# Patient Record
Sex: Male | Born: 1935 | Race: White | Hispanic: No | Marital: Single | Smoking: Former smoker
Health system: Southern US, Community
[De-identification: ages and names within clinical notes are randomized; demographics above are authoritative.]

## PROBLEM LIST (undated history)

## (undated) DIAGNOSIS — E782 Mixed hyperlipidemia: Secondary | ICD-10-CM

## (undated) DIAGNOSIS — M1990 Unspecified osteoarthritis, unspecified site: Secondary | ICD-10-CM

## (undated) DIAGNOSIS — R609 Edema, unspecified: Secondary | ICD-10-CM

## (undated) DIAGNOSIS — I10 Essential (primary) hypertension: Secondary | ICD-10-CM

## (undated) DIAGNOSIS — N400 Enlarged prostate without lower urinary tract symptoms: Secondary | ICD-10-CM

## (undated) DIAGNOSIS — E1169 Type 2 diabetes mellitus with other specified complication: Secondary | ICD-10-CM

## (undated) DIAGNOSIS — R251 Tremor, unspecified: Secondary | ICD-10-CM

## (undated) DIAGNOSIS — H9190 Unspecified hearing loss, unspecified ear: Secondary | ICD-10-CM

## (undated) DIAGNOSIS — E041 Nontoxic single thyroid nodule: Secondary | ICD-10-CM

## (undated) DIAGNOSIS — E785 Hyperlipidemia, unspecified: Secondary | ICD-10-CM

## (undated) DIAGNOSIS — I4891 Unspecified atrial fibrillation: Secondary | ICD-10-CM

## (undated) DIAGNOSIS — K219 Gastro-esophageal reflux disease without esophagitis: Secondary | ICD-10-CM

## (undated) DIAGNOSIS — D649 Anemia, unspecified: Secondary | ICD-10-CM

## (undated) DIAGNOSIS — E559 Vitamin D deficiency, unspecified: Secondary | ICD-10-CM

## (undated) DIAGNOSIS — E119 Type 2 diabetes mellitus without complications: Secondary | ICD-10-CM

## (undated) HISTORY — DX: Unspecified atrial fibrillation: I48.91

## (undated) HISTORY — DX: Benign prostatic hyperplasia without lower urinary tract symptoms: N40.0

## (undated) HISTORY — DX: Anemia, unspecified: D64.9

## (undated) HISTORY — PX: JOINT REPLACEMENT: SHX530

## (undated) HISTORY — PX: KNEE SURGERY: SHX244

## (undated) HISTORY — DX: Edema, unspecified: R60.9

## (undated) HISTORY — DX: Tremor, unspecified: R25.1

## (undated) HISTORY — DX: Essential (primary) hypertension: I10

## (undated) HISTORY — DX: Vitamin D deficiency, unspecified: E55.9

## (undated) HISTORY — DX: Mixed hyperlipidemia: E78.2

## (undated) HISTORY — DX: Type 2 diabetes mellitus with other specified complication: E11.69

## (undated) HISTORY — DX: Gastro-esophageal reflux disease without esophagitis: K21.9

## (undated) HISTORY — DX: Unspecified osteoarthritis, unspecified site: M19.90

## (undated) HISTORY — DX: Hyperlipidemia, unspecified: E78.5

## (undated) HISTORY — DX: Nontoxic single thyroid nodule: E04.1

---

## 2014-08-04 ENCOUNTER — Emergency Department (HOSPITAL_COMMUNITY): Payer: Medicare HMO

## 2014-08-04 ENCOUNTER — Encounter (HOSPITAL_COMMUNITY): Payer: Self-pay | Admitting: Emergency Medicine

## 2014-08-04 ENCOUNTER — Inpatient Hospital Stay (HOSPITAL_COMMUNITY)
Admission: EM | Admit: 2014-08-04 | Discharge: 2014-08-07 | DRG: 871 | Disposition: A | Discharge status: Skilled Nursing Facility | Payer: Medicare HMO | Attending: Internal Medicine | Admitting: Internal Medicine

## 2014-08-04 DIAGNOSIS — J209 Acute bronchitis, unspecified: Secondary | ICD-10-CM | POA: Diagnosis present

## 2014-08-04 DIAGNOSIS — A419 Sepsis, unspecified organism: Principal | ICD-10-CM | POA: Diagnosis present

## 2014-08-04 DIAGNOSIS — I129 Hypertensive chronic kidney disease with stage 1 through stage 4 chronic kidney disease, or unspecified chronic kidney disease: Secondary | ICD-10-CM | POA: Diagnosis present

## 2014-08-04 DIAGNOSIS — J9601 Acute respiratory failure with hypoxia: Secondary | ICD-10-CM | POA: Diagnosis present

## 2014-08-04 DIAGNOSIS — N184 Chronic kidney disease, stage 4 (severe): Secondary | ICD-10-CM | POA: Diagnosis present

## 2014-08-04 DIAGNOSIS — D696 Thrombocytopenia, unspecified: Secondary | ICD-10-CM | POA: Diagnosis present

## 2014-08-04 DIAGNOSIS — Z87891 Personal history of nicotine dependence: Secondary | ICD-10-CM | POA: Diagnosis not present

## 2014-08-04 DIAGNOSIS — I959 Hypotension, unspecified: Secondary | ICD-10-CM

## 2014-08-04 DIAGNOSIS — E860 Dehydration: Secondary | ICD-10-CM | POA: Diagnosis present

## 2014-08-04 DIAGNOSIS — I4891 Unspecified atrial fibrillation: Secondary | ICD-10-CM | POA: Diagnosis present

## 2014-08-04 DIAGNOSIS — I4819 Other persistent atrial fibrillation: Secondary | ICD-10-CM | POA: Insufficient documentation

## 2014-08-04 DIAGNOSIS — J189 Pneumonia, unspecified organism: Secondary | ICD-10-CM | POA: Diagnosis present

## 2014-08-04 DIAGNOSIS — D631 Anemia in chronic kidney disease: Secondary | ICD-10-CM | POA: Diagnosis present

## 2014-08-04 DIAGNOSIS — E119 Type 2 diabetes mellitus without complications: Secondary | ICD-10-CM | POA: Diagnosis present

## 2014-08-04 DIAGNOSIS — I481 Persistent atrial fibrillation: Secondary | ICD-10-CM | POA: Diagnosis present

## 2014-08-04 DIAGNOSIS — N400 Enlarged prostate without lower urinary tract symptoms: Secondary | ICD-10-CM | POA: Diagnosis present

## 2014-08-04 HISTORY — DX: Essential (primary) hypertension: I10

## 2014-08-04 HISTORY — DX: Type 2 diabetes mellitus without complications: E11.9

## 2014-08-04 LAB — BASIC METABOLIC PANEL
ANION GAP: 9 (ref 5–15)
BUN: 50 mg/dL — ABNORMAL HIGH (ref 6–23)
CALCIUM: 8 mg/dL — AB (ref 8.4–10.5)
CO2: 19 mmol/L (ref 19–32)
Chloride: 103 mmol/L (ref 96–112)
Creatinine, Ser: 2.72 mg/dL — ABNORMAL HIGH (ref 0.50–1.35)
GFR calc non Af Amer: 21 mL/min — ABNORMAL LOW (ref >90)
GFR, EST AFRICAN AMERICAN: 24 mL/min — AB (ref >90)
Glucose, Bld: 123 mg/dL — ABNORMAL HIGH (ref 70–99)
Potassium: 3.7 mmol/L (ref 3.5–5.1)
SODIUM: 131 mmol/L — AB (ref 135–145)

## 2014-08-04 LAB — CBC WITH DIFFERENTIAL/PLATELET
BASOS PCT: 0 % (ref 0–1)
Basophils Absolute: 0 10*3/uL (ref 0.0–0.1)
EOS ABS: 0 10*3/uL (ref 0.0–0.7)
EOS PCT: 0 % (ref 0–5)
HEMATOCRIT: 23.5 % — AB (ref 39.0–52.0)
Hemoglobin: 8 g/dL — ABNORMAL LOW (ref 13.0–17.0)
Lymphocytes Relative: 4 % — ABNORMAL LOW (ref 12–46)
Lymphs Abs: 0.2 10*3/uL — ABNORMAL LOW (ref 0.7–4.0)
MCH: 28.1 pg (ref 26.0–34.0)
MCHC: 34 g/dL (ref 30.0–36.0)
MCV: 82.5 fL (ref 78.0–100.0)
MONO ABS: 0.1 10*3/uL (ref 0.1–1.0)
MONOS PCT: 3 % (ref 3–12)
Neutro Abs: 4.2 10*3/uL (ref 1.7–7.7)
Neutrophils Relative %: 93 % — ABNORMAL HIGH (ref 43–77)
Platelets: 97 10*3/uL — ABNORMAL LOW (ref 150–400)
RBC: 2.85 MIL/uL — ABNORMAL LOW (ref 4.22–5.81)
RDW: 14.3 % (ref 11.5–15.5)
WBC: 4.5 10*3/uL (ref 4.0–10.5)

## 2014-08-04 LAB — I-STAT ARTERIAL BLOOD GAS, ED
Acid-base deficit: 7 mmol/L — ABNORMAL HIGH (ref 0.0–2.0)
Bicarbonate: 17.4 mEq/L — ABNORMAL LOW (ref 20.0–24.0)
O2 Saturation: 97 %
PH ART: 7.369 (ref 7.350–7.450)
Patient temperature: 98.6
TCO2: 18 mmol/L (ref 0–100)
pCO2 arterial: 30.2 mmHg — ABNORMAL LOW (ref 35.0–45.0)
pO2, Arterial: 97 mmHg (ref 80.0–100.0)

## 2014-08-04 LAB — PROTIME-INR
INR: 3.22 — ABNORMAL HIGH (ref 0.00–1.49)
PROTHROMBIN TIME: 33.1 s — AB (ref 11.6–15.2)

## 2014-08-04 LAB — TROPONIN I: Troponin I: 0.04 ng/mL — ABNORMAL HIGH (ref <0.031)

## 2014-08-04 LAB — BRAIN NATRIURETIC PEPTIDE: B NATRIURETIC PEPTIDE 5: 239 pg/mL — AB (ref 0.0–100.0)

## 2014-08-04 MED ORDER — ACETAMINOPHEN 325 MG PO TABS
650.0000 mg | ORAL_TABLET | Freq: Once | ORAL | Status: AC
  Administered 2014-08-04: 650 mg via ORAL
  Filled 2014-08-04: qty 2

## 2014-08-04 MED ORDER — VANCOMYCIN HCL 10 G IV SOLR
1500.0000 mg | Freq: Once | INTRAVENOUS | Status: DC
  Administered 2014-08-05: 1500 mg via INTRAVENOUS
  Filled 2014-08-04: qty 1500

## 2014-08-04 MED ORDER — SODIUM CHLORIDE 0.9 % IV BOLUS (SEPSIS)
1000.0000 mL | Freq: Once | INTRAVENOUS | Status: AC
  Administered 2014-08-05: 1000 mL via INTRAVENOUS

## 2014-08-04 MED ORDER — PIPERACILLIN-TAZOBACTAM 3.375 G IVPB 30 MIN
3.3750 g | Freq: Once | INTRAVENOUS | Status: AC
  Administered 2014-08-04: 3.375 g via INTRAVENOUS
  Filled 2014-08-04: qty 50

## 2014-08-04 MED ORDER — SODIUM CHLORIDE 0.9 % IV BOLUS (SEPSIS): 1000.0000 mL | Freq: Once | INTRAVENOUS | Status: DC

## 2014-08-04 MED ORDER — SODIUM CHLORIDE 0.9 % IV BOLUS (SEPSIS)
1000.0000 mL | Freq: Once | INTRAVENOUS | Status: AC
  Administered 2014-08-04: 1000 mL via INTRAVENOUS

## 2014-08-04 NOTE — ED Provider Notes (Signed)
CSN: [id number]     Arrival date & time 08/04/14  1943 History   First MD Initiated Contact with Patient 08/04/14 1957     Chief Complaint  Patient presents with  . Abnormal ECG  . Fall     (Consider location/radiation/quality/duration/timing/severity/associated sxs/prior Treatment) HPI  81 year old male presents with shortness of breath that started tonight. Patient has a history of falls, frequently over the past several weeks. He was at home with family members never working on further help for the patient with healthcare workers in the nose that he was short of breath. He had started coughing as well. Due to this EMS was called. The patient was noted to have an abnormal EKG that was concerning for a STEMI and so he was brought to [facility] instead of [place] like typical. Patient did have a fall earlier this morning. He has been having these falls frequently and this one was no different. He was walking in his legs acutely got weak and he gently lowered himself to the ground. Did not hit his head. Did not pass out. Never felt dizzy. At this time he is not having any chest pain. His short some breath has significantly improved with oxygen. He chronically has lower extremity swelling, right worse than left and this is unchanged for the past several years. He's been worked up for blood clots before and this is been negative.  No past medical history on file. No past surgical history on file. No family history on file. History  Substance Use Topics  . Smoking status: Not on file  . Smokeless tobacco: Not on file  . Alcohol Use: Not on file    Review of Systems  Constitutional: Negative for fever.  Respiratory: Positive for cough and shortness of breath.   Cardiovascular: Positive for leg swelling. Negative for chest pain.  Gastrointestinal: Negative for vomiting and abdominal pain.  Neurological: Positive for weakness. Negative for numbness and headaches.  All other systems reviewed  and are negative.     Allergies  Review of patient's allergies indicates not on file.  Home Medications   Prior to Admission medications   Not on File   SpO2 96% Physical Exam  Constitutional: He is oriented to person, place, and time. He appears well-developed and well-nourished.  HENT:  Head: Normocephalic and atraumatic.  Right Ear: External ear normal.  Left Ear: External ear normal.  Nose: Nose normal.  Eyes: Right eye exhibits no discharge. Left eye exhibits no discharge.  Neck: Neck supple.  Cardiovascular: Normal rate, regular rhythm, normal heart sounds and intact distal pulses.   Pulmonary/Chest: Effort normal. He has rales (bibasilar).  Abdominal: Soft. There is no tenderness.  Musculoskeletal: He exhibits edema (pitting edema in lower legs and feet, right greater than left).  Neurological: He is alert and oriented to person, place, and time.  Skin: Skin is warm and dry.  Nursing note and vitals reviewed.   ED Course  Procedures (including critical care time) Labs Review Labs Reviewed  BASIC METABOLIC PANEL - Abnormal; Notable for the following:    Sodium 131 (*)    Glucose, Bld 123 (*)    BUN 50 (*)    Creatinine, Ser 2.72 (*)    Calcium 8.0 (*)    GFR calc non Af Amer 21 (*)    GFR calc Af Amer 24 (*)    All other components within normal limits  BRAIN NATRIURETIC PEPTIDE - Abnormal; Notable for the following:    B Natriuretic  Peptide 239.0 (*)    All other components within normal limits  CBC WITH DIFFERENTIAL/PLATELET - Abnormal; Notable for the following:    RBC 2.85 (*)    Hemoglobin 8.0 (*)    HCT 23.5 (*)    Platelets 97 (*)    Neutrophils Relative % 93 (*)    Lymphocytes Relative 4 (*)    Lymphs Abs 0.2 (*)    All other components within normal limits  TROPONIN I - Abnormal; Notable for the following:    Troponin I 0.04 (*)    All other components within normal limits  PROTIME-INR - Abnormal; Notable for the following:    Prothrombin  Time 33.1 (*)    INR 3.22 (*)    All other components within normal limits  I-STAT ARTERIAL BLOOD GAS, ED - Abnormal; Notable for the following:    pCO2 arterial 30.2 (*)    Bicarbonate 17.4 (*)    Acid-base deficit 7.0 (*)    All other components within normal limits  CULTURE, BLOOD (ROUTINE X 2)  CULTURE, BLOOD (ROUTINE X 2)  BLOOD GAS, ARTERIAL    Imaging Review Dg Chest Port 1 View  08/04/2014   CLINICAL DATA:  Abnormal ECG, fall with weakness.  EXAM: PORTABLE CHEST - 1 VIEW  COMPARISON:  Chest radiograph August 03, 2014  FINDINGS: The cardiac silhouette is mildly enlarged. Low inspiratory examination with crowded vascular markings, mild pulmonary vascular congestion. No plural effusion or focal consolidation. No pneumothorax.  Osteopenia. Moderate degenerative change of the shoulders. Reported potential RIGHT rib fracture arm prior imaging is less apparent on portable radiography. Vascular calcifications in the neck.  IMPRESSION: Mild cardiomegaly and pulmonary vascular congestion.   Electronically Signed   By: Elon Alas   On: 08/04/2014 21:20     EKG Interpretation   Date/Time:  Tuesday August 04 2014 19:51:59 EST Ventricular Rate:  115 PR Interval:    QRS Duration: 129 QT Interval:  355 QTC Calculation: 491 R Axis:   -77 Text Interpretation:  Rhythm difficult to interpret Ventricular premature  complex RBBB and LAFB Inferior infarct, old No old tracing to compare  Confirmed by [place] ([id number]) on 08/04/2014 7:55:27 PM      MDM   Final diagnoses:  Healthcare-associated pneumonia    Patient has no respiratory distress here, but has new hypoxia and is requiring 4 L nasal cannula. Due to fever and cough this is likely a hospital-acquired pneumonia as he was recently admitted to a rehabilitation facility. While in the ER his blood pressure did start to trend down into the 90s and was given IV fluids for resuscitation. Will need admission to the  hospitalist. At this point he is breathing well with oxygen support and is in no need of acute airway management.    Ephraim Hamburger, MD 08/04/14 [phone]

## 2014-08-04 NOTE — H&P (Signed)
[facility] History and Physical  Glen Lee OZH:[id number] DOB: [birth year] DOA: 08/04/2014  Referring physician: Sherwood Gambler, MD PCP: PROVIDER NOT IN SYSTEM   Chief Complaint: Hypotension  HPI: Glen Lee is a 81 y.o. male presents with hypotension and pneumonia. His sister is with him in the room. Patient was recently discharged from a rehab facility due to weakness deconditioning and frequent falls. He was living at home for about a week. His sister states he started falling again over the weekend. She states he was on the ground for the whole day. She finally brought him to her house on Sunday as she was very concerned for his safety. He has been dwindling and has been more short of breath. He has been still with it mentally. Patient has had no fevers noted. No cough or congestion noted. Patient has been a smoker in the past. He also is a diabetic and has hypertension.   Review of Systems:  Constitutional:  No weight loss, night sweats, Fevers, chills, ++fatigue.  HEENT:  No headaches, itching, ear ache, nasal congestion, post nasal drip,  Cardio-vascular:  No chest pain, Orthopnea, PND, +swelling in lower extremities, dizziness, palpitations  GI:  No heartburn, indigestion, abdominal pain, nausea, vomiting, diarrhea  Resp:  ++shortness of breath with exertion. no productive cough, No coughing up of blood.No wheezing.  Skin:  Apparently had cellulitis about 3 weeks ago GU:  no dysuria, change in color of urine, no urgency or frequency Musculoskeletal:  No joint pain or swelling. No decreased range of motion. No back pain.  Psych:  No change in mood or affect. No depression or anxiety. No memory loss.   Past Medical History  Diagnosis Date  . Diabetes mellitus without complication   . Hypertension    History reviewed. No pertinent past surgical history. Social History:  reports that he quit smoking about 22 years ago. His smoking use included Cigarettes. He  does not have any smokeless tobacco history on file. He reports that he does not drink alcohol. His drug history is not on file.  No Known Allergies  No family history on file.   Prior to Admission medications   Medication Sig Start Date End Date Taking? Authorizing Provider  amLODipine (NORVASC) 5 MG tablet Take 5 mg by mouth daily.   Yes Historical Provider, MD  Cholecalciferol (VITAMIN D3) 3000 UNITS TABS Take 1 tablet by mouth daily.   Yes Historical Provider, MD  dutasteride (AVODART) 0.5 MG capsule Take 0.5 mg by mouth daily.   Yes Historical Provider, MD  exenatide (BYETTA) 5 MCG/0.02ML SOPN injection Inject 5 mcg into the skin daily as needed (bs > 130).   Yes Historical Provider, MD  ferrous sulfate 325 (65 FE) MG tablet Take 325 mg by mouth daily with breakfast.   Yes Historical Provider, MD  finasteride (PROSCAR) 5 MG tablet Take 5 mg by mouth daily.   Yes Historical Provider, MD  lisinopril (PRINIVIL,ZESTRIL) 20 MG tablet Take 20 mg by mouth daily.   Yes Historical Provider, MD  Mesalamine 800 MG TBEC Take 800 mg by mouth 3 (three) times daily.   Yes Historical Provider, MD  Omega-3 Fatty Acids (OMEGA-3 FISH OIL) 300 MG CAPS Take 300 mg by mouth daily.   Yes Historical Provider, MD  oxymetazoline (AFRIN) 0.05 % nasal spray Place 1 spray into both nostrils 2 (two) times daily as needed for congestion.   Yes Historical Provider, MD  pantoprazole (PROTONIX) 40 MG tablet Take 40 mg by mouth daily.  Yes Historical Provider, MD  POTASSIUM GLUCONATE PO Take 1 tablet by mouth daily.   Yes Historical Provider, MD  rivaroxaban (XARELTO) 20 MG TABS tablet Take 20 mg by mouth daily with supper.   Yes Historical Provider, MD  simvastatin (ZOCOR) 40 MG tablet Take 40 mg by mouth daily.   Yes Historical Provider, MD  sitaGLIPtin (JANUVIA) 25 MG tablet Take 25 mg by mouth daily.   Yes Historical Provider, MD  tamsulosin (FLOMAX) 0.4 MG CAPS capsule Take 0.4 mg by mouth daily.   Yes Historical  Provider, MD  Vitamin D, Ergocalciferol, (DRISDOL) 50000 UNITS CAPS capsule Take 50,000 Units by mouth every 7 (seven) days.   Yes Historical Provider, MD   Physical Exam: Filed Vitals:   08/04/14 2115 08/04/14 2237 08/04/14 2245 08/04/14 2300  BP: 95/53 89/51 92/44    Pulse: 91 83 81   Temp:  98.9 F (37.2 C)    TempSrc:  Rectal    Resp: 22  16   Height:    6' (1.829 m)  Weight:    99.338 kg (219 lb)  SpO2: 93% 98% 99%     Wt Readings from Last 3 Encounters:  08/04/14 99.338 kg (219 lb)    General:  Appears calm and comfortable Eyes: PERRL, normal lids, irises & conjunctiva ENT: grossly normal hearing, lips & tongue Neck: no LAD, masses or thyromegaly Cardiovascular: RRR, no m/r/g. ++ LE edema. Respiratory: few ronchi noted increased WOB noted. Abdomen: soft, ntnd Skin: ?petechiae on LE seen on limited exam Musculoskeletal: grossly normal tone BUE/BLE Psychiatric: speech fluent and appropriate Neurologic: grossly non-focal.          Labs on Admission:  Basic Metabolic Panel:  Recent Labs Lab 08/04/14 2058  NA 131*  K 3.7  CL 103  CO2 19  GLUCOSE 123*  BUN 50*  CREATININE 2.72*  CALCIUM 8.0*   Liver Function Tests: No results for input(s): AST, ALT, ALKPHOS, BILITOT, PROT, ALBUMIN in the last 168 hours. No results for input(s): LIPASE, AMYLASE in the last 168 hours. No results for input(s): AMMONIA in the last 168 hours. CBC:  Recent Labs Lab 08/04/14 2058  WBC 4.5  NEUTROABS 4.2  HGB 8.0*  HCT 23.5*  MCV 82.5  PLT 97*   Cardiac Enzymes:  Recent Labs Lab 08/04/14 2058  TROPONINI 0.04*    BNP (last 3 results)  Recent Labs  08/04/14 2058  BNP 239.0*    ProBNP (last 3 results) No results for input(s): PROBNP in the last 8760 hours.  CBG: No results for input(s): GLUCAP in the last 168 hours.  Radiological Exams on Admission: Dg Chest Port 1 View  08/04/2014   CLINICAL DATA:  Abnormal ECG, fall with weakness.  EXAM: PORTABLE CHEST -  1 VIEW  COMPARISON:  Chest radiograph August 03, 2014  FINDINGS: The cardiac silhouette is mildly enlarged. Low inspiratory examination with crowded vascular markings, mild pulmonary vascular congestion. No plural effusion or focal consolidation. No pneumothorax.  Osteopenia. Moderate degenerative change of the shoulders. Reported potential RIGHT rib fracture arm prior imaging is less apparent on portable radiography. Vascular calcifications in the neck.  IMPRESSION: Mild cardiomegaly and pulmonary vascular congestion.   Electronically Signed   By: Elon Alas   On: 08/04/2014 21:20      Assessment/Plan Active Problems:   Healthcare-associated pneumonia   Diabetes mellitus   Hypotension   1. HCAP -Patient has been in a skilled facility has an abnormal appearing CXR presently will cover broadly for  infection of HCAP organisms started on cefepime and Vancomycin -will also get an ABG now -will follow up CXR  2. Diabetes Mellitus -will get A1C -will continue with home medications -will also check FSBS and provide sliding scale coverage  3. Hypotension -related to infection/sepsis -started on antibiotics -will also hydrate seems to be responding to fluid challenge  4. CKD IV -do not have baseline creatinine -will hydrate -will monitor labs    Code Status: Full Code (must indicate code status--if unknown or must be presumed, indicate so) DVT Prophylaxis:heparin Family Communication: Sister (indicate person spoken with, if applicable, with phone number if by telephone) Disposition Plan: Home (indicate anticipated LOS)  Time spent: 43mn  Aizah Gehlhausen A [facility] Pager [phone]

## 2014-08-04 NOTE — ED Notes (Signed)
MD Regenia Skeeter at bedside

## 2014-08-04 NOTE — ED Notes (Signed)
Per EMS: Patient called 911 in am due to a fall. Call was cancelled prior to EMS arrival by [employer]. Second call came in this visit due to a second fall today. Denies chest pain, shortness of breath, or N&V. However, EKG revealed abnormalities revealing a possible STEMI and A fib. No history of either. Hst of HTN, High Cholesterol and DM controlled by po medications only. CBG 272 by fire department and repeat for EMS was 127. 87% on RA and 96% on 4L [place]. R side tremors due to previous nerve damage. Edema in BLE with R side more edematous than L. NKA. 324 ASA provided and no nitro due to lack of pain.

## 2014-08-05 ENCOUNTER — Encounter (HOSPITAL_COMMUNITY): Payer: Self-pay | Admitting: *Deleted

## 2014-08-05 ENCOUNTER — Inpatient Hospital Stay (HOSPITAL_COMMUNITY): Payer: Medicare HMO

## 2014-08-05 DIAGNOSIS — I4819 Other persistent atrial fibrillation: Secondary | ICD-10-CM | POA: Insufficient documentation

## 2014-08-05 DIAGNOSIS — I481 Persistent atrial fibrillation: Secondary | ICD-10-CM

## 2014-08-05 LAB — URINALYSIS, ROUTINE W REFLEX MICROSCOPIC
Bilirubin Urine: NEGATIVE
Glucose, UA: NEGATIVE mg/dL
Hgb urine dipstick: NEGATIVE
Ketones, ur: NEGATIVE mg/dL
LEUKOCYTES UA: NEGATIVE
NITRITE: NEGATIVE
PH: 5 (ref 5.0–8.0)
PROTEIN: 30 mg/dL — AB
Specific Gravity, Urine: 1.023 (ref 1.005–1.030)
UROBILINOGEN UA: 0.2 mg/dL (ref 0.0–1.0)

## 2014-08-05 LAB — CBC
HEMATOCRIT: 23.2 % — AB (ref 39.0–52.0)
HEMOGLOBIN: 7.7 g/dL — AB (ref 13.0–17.0)
MCH: 28.3 pg (ref 26.0–34.0)
MCHC: 33.2 g/dL (ref 30.0–36.0)
MCV: 85.3 fL (ref 78.0–100.0)
Platelets: 77 10*3/uL — ABNORMAL LOW (ref 150–400)
RBC: 2.72 MIL/uL — ABNORMAL LOW (ref 4.22–5.81)
RDW: 14.6 % (ref 11.5–15.5)
WBC: 3.4 10*3/uL — ABNORMAL LOW (ref 4.0–10.5)

## 2014-08-05 LAB — COMPREHENSIVE METABOLIC PANEL
ALBUMIN: 2.2 g/dL — AB (ref 3.5–5.2)
ALT: 26 U/L (ref 0–53)
AST: 37 U/L (ref 0–37)
Alkaline Phosphatase: 62 U/L (ref 39–117)
Anion gap: 9 (ref 5–15)
BUN: 51 mg/dL — AB (ref 6–23)
CO2: 18 mmol/L — AB (ref 19–32)
Calcium: 7.6 mg/dL — ABNORMAL LOW (ref 8.4–10.5)
Chloride: 106 mmol/L (ref 96–112)
Creatinine, Ser: 2.81 mg/dL — ABNORMAL HIGH (ref 0.50–1.35)
GFR calc Af Amer: 23 mL/min — ABNORMAL LOW (ref >90)
GFR calc non Af Amer: 20 mL/min — ABNORMAL LOW (ref >90)
GLUCOSE: 132 mg/dL — AB (ref 70–99)
Potassium: 3.7 mmol/L (ref 3.5–5.1)
SODIUM: 133 mmol/L — AB (ref 135–145)
TOTAL PROTEIN: 4.6 g/dL — AB (ref 6.0–8.3)
Total Bilirubin: 0.9 mg/dL (ref 0.3–1.2)

## 2014-08-05 LAB — MRSA PCR SCREENING: MRSA BY PCR: NEGATIVE

## 2014-08-05 LAB — INFLUENZA PANEL BY PCR (TYPE A & B)
H1N1FLUPCR: NOT DETECTED
Influenza A By PCR: NEGATIVE
Influenza B By PCR: NEGATIVE

## 2014-08-05 LAB — TROPONIN I
TROPONIN I: 0.03 ng/mL (ref <0.031)
Troponin I: 0.03 ng/mL (ref <0.031)
Troponin I: 0.03 ng/mL (ref <0.031)

## 2014-08-05 LAB — LEGIONELLA ANTIGEN, URINE

## 2014-08-05 LAB — GLUCOSE, CAPILLARY
GLUCOSE-CAPILLARY: 169 mg/dL — AB (ref 70–99)
Glucose-Capillary: 166 mg/dL — ABNORMAL HIGH (ref 70–99)

## 2014-08-05 LAB — URINE MICROSCOPIC-ADD ON

## 2014-08-05 LAB — STREP PNEUMONIAE URINARY ANTIGEN: Strep Pneumo Urinary Antigen: NEGATIVE

## 2014-08-05 LAB — TSH: TSH: 0.713 u[IU]/mL (ref 0.350–4.500)

## 2014-08-05 MED ORDER — VANCOMYCIN HCL IN DEXTROSE 750-5 MG/150ML-% IV SOLN
750.0000 mg | INTRAVENOUS | Status: DC
  Administered 2014-08-05: 750 mg via INTRAVENOUS
  Filled 2014-08-05: qty 150

## 2014-08-05 MED ORDER — ATORVASTATIN CALCIUM 20 MG PO TABS
20.0000 mg | ORAL_TABLET | Freq: Every day | ORAL | Status: DC
  Administered 2014-08-05 – 2014-08-06 (×2): 20 mg via ORAL
  Filled 2014-08-05 (×3): qty 1

## 2014-08-05 MED ORDER — MESALAMINE 800 MG PO TBEC: 800.0000 mg | DELAYED_RELEASE_TABLET | Freq: Three times a day (TID) | ORAL | Status: DC

## 2014-08-05 MED ORDER — VITAMIN B-1 100 MG PO TABS
100.0000 mg | ORAL_TABLET | Freq: Every day | ORAL | Status: DC
  Administered 2014-08-05 – 2014-08-07 (×3): 100 mg via ORAL
  Filled 2014-08-05 (×3): qty 1

## 2014-08-05 MED ORDER — FOLIC ACID 1 MG PO TABS
1.0000 mg | ORAL_TABLET | Freq: Every day | ORAL | Status: DC
  Administered 2014-08-05 – 2014-08-07 (×3): 1 mg via ORAL
  Filled 2014-08-05 (×3): qty 1

## 2014-08-05 MED ORDER — SODIUM CHLORIDE 0.9 % IV SOLN: INTRAVENOUS | Status: DC

## 2014-08-05 MED ORDER — ADULT MULTIVITAMIN W/MINERALS CH
1.0000 | ORAL_TABLET | Freq: Every day | ORAL | Status: DC
  Administered 2014-08-05 – 2014-08-07 (×3): 1 via ORAL
  Filled 2014-08-05 (×3): qty 1

## 2014-08-05 MED ORDER — ACETAMINOPHEN 325 MG PO TABS
650.0000 mg | ORAL_TABLET | Freq: Four times a day (QID) | ORAL | Status: DC | PRN
  Administered 2014-08-06 (×3): 650 mg via ORAL
  Filled 2014-08-05 (×3): qty 2

## 2014-08-05 MED ORDER — PNEUMOCOCCAL VAC POLYVALENT 25 MCG/0.5ML IJ INJ
0.5000 mL | INJECTION | INTRAMUSCULAR | Status: DC
  Filled 2014-08-05: qty 0.5

## 2014-08-05 MED ORDER — ACETAMINOPHEN 650 MG RE SUPP: 650.0000 mg | Freq: Four times a day (QID) | RECTAL | Status: DC | PRN

## 2014-08-05 MED ORDER — MESALAMINE 400 MG PO CPDR
800.0000 mg | DELAYED_RELEASE_CAPSULE | Freq: Three times a day (TID) | ORAL | Status: DC
  Administered 2014-08-05 (×2): 800 mg via ORAL
  Administered 2014-08-06: 400 mg via ORAL
  Administered 2014-08-06 – 2014-08-07 (×3): 800 mg via ORAL
  Filled 2014-08-05 (×9): qty 2

## 2014-08-05 MED ORDER — VANCOMYCIN HCL IN DEXTROSE 750-5 MG/150ML-% IV SOLN
750.0000 mg | INTRAVENOUS | Status: AC
  Administered 2014-08-05: 750 mg via INTRAVENOUS
  Filled 2014-08-05: qty 150

## 2014-08-05 MED ORDER — DILTIAZEM HCL 60 MG PO TABS
60.0000 mg | ORAL_TABLET | Freq: Three times a day (TID) | ORAL | Status: DC
  Administered 2014-08-05 (×2): 60 mg via ORAL
  Filled 2014-08-05 (×6): qty 1

## 2014-08-05 MED ORDER — PANTOPRAZOLE SODIUM 40 MG PO TBEC
40.0000 mg | DELAYED_RELEASE_TABLET | Freq: Every day | ORAL | Status: DC
  Administered 2014-08-05 – 2014-08-07 (×3): 40 mg via ORAL
  Filled 2014-08-05 (×2): qty 1

## 2014-08-05 MED ORDER — FERROUS SULFATE 325 (65 FE) MG PO TABS
325.0000 mg | ORAL_TABLET | Freq: Every day | ORAL | Status: DC
Start: 2014-08-05 — End: 2014-08-07
  Administered 2014-08-05 – 2014-08-07 (×3): 325 mg via ORAL
  Filled 2014-08-05 (×4): qty 1

## 2014-08-05 MED ORDER — ONDANSETRON HCL 4 MG/2ML IJ SOLN
4.0000 mg | Freq: Four times a day (QID) | INTRAMUSCULAR | Status: DC | PRN
  Administered 2014-08-05: 4 mg via INTRAVENOUS
  Filled 2014-08-05: qty 2

## 2014-08-05 MED ORDER — EXENATIDE 5 MCG/0.02ML ~~LOC~~ SOPN: 5.0000 ug | PEN_INJECTOR | Freq: Every day | SUBCUTANEOUS | Status: DC | PRN

## 2014-08-05 MED ORDER — FINASTERIDE 5 MG PO TABS
5.0000 mg | ORAL_TABLET | Freq: Every day | ORAL | Status: DC
  Administered 2014-08-05 – 2014-08-07 (×3): 5 mg via ORAL
  Filled 2014-08-05 (×3): qty 1

## 2014-08-05 MED ORDER — VANCOMYCIN HCL 10 G IV SOLR: 1500.0000 mg | INTRAVENOUS | Status: DC

## 2014-08-05 MED ORDER — INSULIN ASPART 100 UNIT/ML ~~LOC~~ SOLN: 0.0000 [IU] | Freq: Every day | SUBCUTANEOUS | Status: DC

## 2014-08-05 MED ORDER — INSULIN ASPART 100 UNIT/ML ~~LOC~~ SOLN
0.0000 [IU] | Freq: Three times a day (TID) | SUBCUTANEOUS | Status: DC
  Administered 2014-08-05 – 2014-08-06 (×2): 3 [IU] via SUBCUTANEOUS
  Administered 2014-08-06: 2 [IU] via SUBCUTANEOUS
  Administered 2014-08-06 – 2014-08-07 (×2): 3 [IU] via SUBCUTANEOUS

## 2014-08-05 MED ORDER — LINAGLIPTIN 5 MG PO TABS
5.0000 mg | ORAL_TABLET | Freq: Every day | ORAL | Status: DC
  Administered 2014-08-05 – 2014-08-07 (×3): 5 mg via ORAL
  Filled 2014-08-05 (×3): qty 1

## 2014-08-05 MED ORDER — SODIUM CHLORIDE 0.9 % IJ SOLN
3.0000 mL | Freq: Two times a day (BID) | INTRAMUSCULAR | Status: DC
  Administered 2014-08-05 – 2014-08-07 (×3): 3 mL via INTRAVENOUS

## 2014-08-05 MED ORDER — PROMETHAZINE HCL 25 MG/ML IJ SOLN
12.5000 mg | Freq: Four times a day (QID) | INTRAMUSCULAR | Status: DC | PRN
  Administered 2014-08-05: 12.5 mg via INTRAVENOUS
  Filled 2014-08-05: qty 1

## 2014-08-05 MED ORDER — ONDANSETRON HCL 4 MG PO TABS: 4.0000 mg | ORAL_TABLET | Freq: Four times a day (QID) | ORAL | Status: DC | PRN

## 2014-08-05 MED ORDER — TAMSULOSIN HCL 0.4 MG PO CAPS
0.4000 mg | ORAL_CAPSULE | Freq: Every day | ORAL | Status: DC
  Administered 2014-08-05 – 2014-08-07 (×3): 0.4 mg via ORAL
  Filled 2014-08-05 (×3): qty 1

## 2014-08-05 MED ORDER — AZITHROMYCIN 500 MG PO TABS
500.0000 mg | ORAL_TABLET | Freq: Every day | ORAL | Status: AC
  Administered 2014-08-05: 500 mg via ORAL
  Filled 2014-08-05: qty 1

## 2014-08-05 MED ORDER — AZITHROMYCIN 250 MG PO TABS
250.0000 mg | ORAL_TABLET | Freq: Every day | ORAL | Status: DC
  Filled 2014-08-05: qty 1

## 2014-08-05 MED ORDER — DUTASTERIDE 0.5 MG PO CAPS
0.5000 mg | ORAL_CAPSULE | Freq: Every day | ORAL | Status: DC
  Administered 2014-08-05: 0.5 mg via ORAL
  Filled 2014-08-05: qty 1

## 2014-08-05 MED ORDER — SIMVASTATIN 40 MG PO TABS
40.0000 mg | ORAL_TABLET | Freq: Every day | ORAL | Status: DC
  Filled 2014-08-05: qty 1

## 2014-08-05 MED ORDER — PIPERACILLIN-TAZOBACTAM 3.375 G IVPB
3.3750 g | Freq: Three times a day (TID) | INTRAVENOUS | Status: DC
  Administered 2014-08-05 – 2014-08-06 (×4): 3.375 g via INTRAVENOUS
  Filled 2014-08-05 (×6): qty 50

## 2014-08-05 NOTE — Progress Notes (Signed)
Initial sputum sample collected this a.m. insuff amnt & had piece of food in it; have asked pt multiple times about bringing up sputum, says has none.  (Rare cough, dry.)  Lungs clear.

## 2014-08-05 NOTE — Progress Notes (Signed)
Notified o/c(Schorr) about decrease in BP=80s/40s, pt is asymptomatic with no complaints. Instructed to notify if MAP falls below 55, will continue monitoring

## 2014-08-05 NOTE — Progress Notes (Signed)
[facility] Progress Note   Glen Lee TRR:[id number] DOB: 10/06/1933 DOA: 08/04/2014 PCP: PROVIDER NOT IN SYSTEM  Brief narrative: Glen Lee is a 81 y.o. male with past medical history of hypertension and diabetes who presented to the hospital for multiple falls. The patient states that he went to rehabilitation about a month ago and eventually came back home. Once home he began getting weak again and has been falling because his legs are now giving out. In the ER he was noted to be hypoxic on room air and required 4 L of nasal cannula to keep his pulse ox above 90. Apparently he also complained of cough and shortness of breath to the ER doctor. Rectal temp was noted to be 102.3. He was started on treatment for HCAP he was recently in a rehabilitation facility.   Subjective: Has no complaints. When asked today, he states that he has not had much of a cough. He has not noticed if he had fevers either. Very concerned about his multiple falls especially as he lives alone.  Assessment/Plan: Principal Problem:   Healthcare-associated pneumonia? Fever - Chest x-ray is clear- Will repeat a chest x-ray which will BE PA/lateral - We'll obtain a UA as well - Continue treatment with vancomycin and Zosyn-add Zithromax  Active Problems: Atrial fibrillation -He does not appear to have a diagnosis of this in the chart but appears to have been on Xarelto at home which should not be resumed due to his poor creatinine clearance - Will start short acting Cardizem as he is not on a rate controlling agent -due to frequent falls, he is not a candidate for long-term anticoagulation but while he is here being monitored we can place him on Lovenox - Holding Norvasc    Diabetes mellitus - Takes Byetta and Januvia at home-has been placed on Trajenta -will start sliding scale insulin and obtain A1c    CKD (chronic kidney disease) stage 4, GFR 15-29 ml/min - No baseline creatinine to compare with-  holding lisinopril  BPH -Continue Avodart and Flomax  Code Status: Full code Family Communication:  Disposition Plan: Likely will need to go to skilled nursing facility DVT prophylaxis: Lovenox  Consultants:   Procedures:   Antibiotics: Anti-infectives    Start     Dose/Rate Route Frequency Ordered Stop   08/06/14 1000  azithromycin (ZITHROMAX) tablet 250 mg     250 mg Oral Daily 08/05/14 0900 08/10/14 0959   08/05/14 1000  azithromycin (ZITHROMAX) tablet 500 mg     500 mg Oral Daily 08/05/14 0900 08/05/14 0940   08/05/14 0600  vancomycin (VANCOCIN) IVPB 750 mg/150 ml premix     750 mg 150 mL/hr over 60 Minutes Intravenous Every 24 hours 08/05/14 0102     08/05/14 0600  piperacillin-tazobactam (ZOSYN) IVPB 3.375 g     3.375 g 12.5 mL/hr over 240 Minutes Intravenous 3 times per day 08/05/14 0102     08/04/14 2330  vancomycin (VANCOCIN) 1,500 mg in sodium chloride 0.9 % 500 mL IVPB  Status:  Discontinued     1,500 mg 250 mL/hr over 120 Minutes Intravenous  Once 08/04/14 2311 08/05/14 0102   08/04/14 2315  piperacillin-tazobactam (ZOSYN) IVPB 3.375 g     3.375 g 100 mL/hr over 30 Minutes Intravenous  Once 08/04/14 2311 08/04/14 2354         Objective: Filed Weights   08/04/14 2300 08/05/14 0102  Weight: 99.338 kg (219 lb) 102.9 kg (226 lb 13.7 oz)    Intake/Output  Summary (Last 24 hours) at 08/05/14 1127 Last data filed at 08/05/14 0800  Gross per 24 hour  Intake   1740 ml  Output    350 ml  Net   1390 ml     Vitals Filed Vitals:   08/05/14 0700 08/05/14 0800 08/05/14 1000 08/05/14 1100  BP:  126/68  111/67  Pulse:  117 105 102  Temp: 98.8 F (37.1 C)  98.2 F (36.8 C)   TempSrc: Oral  Oral   Resp:  24 24 23   Height:      Weight:      SpO2:  96% 95% 94%    Exam: General: Awake alert oriented 3, No acute respiratory distress Lungs: Clear to auscultation bilaterally without wheezes or crackles- and is unable to sit up and due to obesity breath  sounds are decreased Cardiovascular: Regular rate and rhythm without murmur gallop or rub normal S1 and S2 Abdomen: Nontender, nondistended, soft, bowel sounds positive, no rebound, no ascites, no appreciable mass Extremities: No significant cyanosis, clubbing, or edema bilateral lower extremities  Data Reviewed: Basic Metabolic Panel:  Recent Labs Lab 08/04/14 2058 08/05/14 0131  NA 131* 133*  K 3.7 3.7  CL 103 106  CO2 19 18*  GLUCOSE 123* 132*  BUN 50* 51*  CREATININE 2.72* 2.81*  CALCIUM 8.0* 7.6*   Liver Function Tests:  Recent Labs Lab 08/05/14 0131  AST 37  ALT 26  ALKPHOS 62  BILITOT 0.9  PROT 4.6*  ALBUMIN 2.2*   No results for input(s): LIPASE, AMYLASE in the last 168 hours. No results for input(s): AMMONIA in the last 168 hours. CBC:  Recent Labs Lab 08/04/14 2058 08/05/14 0131  WBC 4.5 3.4*  NEUTROABS 4.2  --   HGB 8.0* 7.7*  HCT 23.5* 23.2*  MCV 82.5 85.3  PLT 97* 77*   Cardiac Enzymes:  Recent Labs Lab 08/04/14 2058 08/05/14 0131 08/05/14 0801  TROPONINI 0.04* 0.03 0.03   BNP (last 3 results)  Recent Labs  08/04/14 2058  BNP 239.0*    ProBNP (last 3 results) No results for input(s): PROBNP in the last 8760 hours.  CBG: No results for input(s): GLUCAP in the last 168 hours.  Recent Results (from the past 240 hour(s))  MRSA PCR Screening     Status: None   Collection Time: 08/05/14  3:07 AM  Result Value Ref Range Status   MRSA by PCR NEGATIVE NEGATIVE Final    Comment:        The GeneXpert MRSA Assay (FDA approved for NASAL specimens only), is one component of a comprehensive MRSA colonization surveillance program. It is not intended to diagnose MRSA infection nor to guide or monitor treatment for MRSA infections.      Studies:  Recent x-ray studies have been reviewed in detail by the Attending Physician  Scheduled Meds:  Scheduled Meds: . [START ON 08/06/2014] azithromycin  250 mg Oral Daily  . dutasteride   0.5 mg Oral Daily  . ferrous sulfate  325 mg Oral Q breakfast  . finasteride  5 mg Oral Daily  . folic acid  1 mg Oral Daily  . linagliptin  5 mg Oral Daily  . multivitamin with minerals  1 tablet Oral Daily  . pantoprazole  40 mg Oral Daily  . piperacillin-tazobactam (ZOSYN)  IV  3.375 g Intravenous 3 times per day  . [START ON 08/06/2014] pneumococcal 23 valent vaccine  0.5 mL Intramuscular Tomorrow-1000  . simvastatin  40 mg Oral q1800  .  sodium chloride  3 mL Intravenous Q12H  . thiamine  100 mg Oral Daily  . vancomycin  750 mg Intravenous Q24H   Continuous Infusions:   Time spent on care of this patient: 35 minutes   [place], MD 08/05/2014, 11:27 AM  LOS: 1 day   [facility] Office  [phone] Pager - Text Page per [email or web address]  If 7PM-7AM, please contact night-coverage [email or web address]

## 2014-08-05 NOTE — Clinical Social Work Note (Signed)
CSW Consult Acknowledged:   CSW received a consult for SNF placement. CSW awaiting PT/OT evaluation to determine the appropriate level of care.      [place], MSW, [place]

## 2014-08-05 NOTE — Progress Notes (Signed)
ANTIBIOTIC CONSULT NOTE - INITIAL  Pharmacy Consult for Vancomycin and Zosyn  Indication: rule out pneumonia  Allergies  Allergen Reactions  . Keflet [Cephalexin] Rash    Patient Measurements: Height: 6' (182.9 cm) Weight: 219 lb (99.338 kg) IBW/kg (Calculated) : 77.6  Vital Signs: Temp: 98.9 F (37.2 C) (02/23 2237) Temp Source: Rectal (02/23 2237) BP: 94/50 mmHg (02/24 0030) Pulse Rate: 80 (02/24 0030) Intake/Output from previous day: 02/23 0701 - 02/24 0700 In: 1000 [IV Piggyback:1000] Out: -  Intake/Output from this shift: Total I/O In: 1000 [IV Piggyback:1000] Out: -   Labs:  Recent Labs  08/04/14 2058  WBC 4.5  HGB 8.0*  PLT 97*  CREATININE 2.72*   Estimated Creatinine Clearance: 27.3 mL/min (by C-G formula based on Cr of 2.72). No results for input(s): VANCOTROUGH, VANCOPEAK, VANCORANDOM, GENTTROUGH, GENTPEAK, GENTRANDOM, TOBRATROUGH, TOBRAPEAK, TOBRARND, AMIKACINPEAK, AMIKACINTROU, AMIKACIN in the last 72 hours.   Microbiology: No results found for this or any previous visit (from the past 720 hour(s)).  Medical History: Past Medical History  Diagnosis Date  . Diabetes mellitus without complication   . Hypertension     Medications:  Norvasc  Vit D  Avodart  Byetta  Iron  Proscar  Zestril  Mesalamine  Fish Oil  Protonix  Xarelto  Zocor  Januvia  Flomax     Assessment: 81 yo male with hypotension/SOB for empiric antibiotics  Goal of Therapy:  Vancomycin trough level 15-20 mcg/ml  Plan:  Vancomycin 1500 mg IV now, then 750 mg IV q24h Zosyn 3.375 g IV q8h   Iwalani Templeton, [place] 08/05/2014,12:48 AM

## 2014-08-05 NOTE — Progress Notes (Signed)
Pt transferred from the ED around 0100, admitted to Rm/3s15. Pt comes from home alone but currently staying with sister due to recent falls. He is alert and oriented but forgetful at times. Generalized weakness and tremors. No skin breakdown noted. Placed on telemetry, currently Afib. Oriented to room, instructed to call for assistance before getting out of bed. Resting comfortably at this time, will continue to monitor

## 2014-08-05 NOTE — ED Notes (Signed)
Report attempted 

## 2014-08-05 NOTE — Progress Notes (Signed)
Utilization review completed.  

## 2014-08-06 DIAGNOSIS — I951 Orthostatic hypotension: Secondary | ICD-10-CM

## 2014-08-06 DIAGNOSIS — J209 Acute bronchitis, unspecified: Secondary | ICD-10-CM

## 2014-08-06 LAB — CBC
HEMATOCRIT: 22.7 % — AB (ref 39.0–52.0)
Hemoglobin: 7.7 g/dL — ABNORMAL LOW (ref 13.0–17.0)
MCH: 28.2 pg (ref 26.0–34.0)
MCHC: 33.9 g/dL (ref 30.0–36.0)
MCV: 83.2 fL (ref 78.0–100.0)
Platelets: 99 10*3/uL — ABNORMAL LOW (ref 150–400)
RBC: 2.73 MIL/uL — ABNORMAL LOW (ref 4.22–5.81)
RDW: 14.5 % (ref 11.5–15.5)
WBC: 5.4 10*3/uL (ref 4.0–10.5)

## 2014-08-06 LAB — GLUCOSE, CAPILLARY
GLUCOSE-CAPILLARY: 134 mg/dL — AB (ref 70–99)
Glucose-Capillary: 187 mg/dL — ABNORMAL HIGH (ref 70–99)
Glucose-Capillary: 176 mg/dL — ABNORMAL HIGH (ref 70–99)
Glucose-Capillary: 134 mg/dL — ABNORMAL HIGH (ref 70–99)

## 2014-08-06 LAB — BASIC METABOLIC PANEL
Anion gap: 12 (ref 5–15)
BUN: 58 mg/dL — AB (ref 6–23)
CHLORIDE: 105 mmol/L (ref 96–112)
CO2: 16 mmol/L — AB (ref 19–32)
Calcium: 7.8 mg/dL — ABNORMAL LOW (ref 8.4–10.5)
Creatinine, Ser: 2.97 mg/dL — ABNORMAL HIGH (ref 0.50–1.35)
GFR calc non Af Amer: 19 mL/min — ABNORMAL LOW (ref >90)
GFR, EST AFRICAN AMERICAN: 22 mL/min — AB (ref >90)
Glucose, Bld: 129 mg/dL — ABNORMAL HIGH (ref 70–99)
Potassium: 3.7 mmol/L (ref 3.5–5.1)
Sodium: 133 mmol/L — ABNORMAL LOW (ref 135–145)

## 2014-08-06 LAB — RETICULOCYTES
RBC.: 2.86 MIL/uL — AB (ref 4.22–5.81)
Retic Count, Absolute: 11.4 10*3/uL — ABNORMAL LOW (ref 19.0–186.0)
Retic Ct Pct: 0.4 % (ref 0.4–3.1)

## 2014-08-06 LAB — HEMOGLOBIN A1C
Hgb A1c MFr Bld: 6.6 % — ABNORMAL HIGH (ref 4.8–5.6)
Mean Plasma Glucose: 143 mg/dL

## 2014-08-06 MED ORDER — SODIUM CHLORIDE 0.9 % IV BOLUS (SEPSIS): 500.0000 mL | Freq: Once | INTRAVENOUS | Status: DC

## 2014-08-06 MED ORDER — SODIUM CHLORIDE 0.9 % IV BOLUS (SEPSIS)
1000.0000 mL | Freq: Once | INTRAVENOUS | Status: AC
  Administered 2014-08-06: 1000 mL via INTRAVENOUS

## 2014-08-06 MED ORDER — DILTIAZEM HCL 30 MG PO TABS
30.0000 mg | ORAL_TABLET | Freq: Three times a day (TID) | ORAL | Status: DC
  Administered 2014-08-06 – 2014-08-07 (×3): 30 mg via ORAL
  Filled 2014-08-06 (×6): qty 1

## 2014-08-06 MED ORDER — DOXYCYCLINE HYCLATE 100 MG PO TABS
100.0000 mg | ORAL_TABLET | Freq: Two times a day (BID) | ORAL | Status: DC
  Administered 2014-08-06 – 2014-08-07 (×3): 100 mg via ORAL
  Filled 2014-08-06 (×5): qty 1

## 2014-08-06 NOTE — Progress Notes (Signed)
After receiving 1L bolus, Pt. BP still in the 80's. NP, Schorr notified and gave orders for an additional 1L bolus. Will notifty Schorr of the BP once the bolus is complete.

## 2014-08-06 NOTE — Care Management Note (Signed)
    Page 1 of 1   08/07/2014     3:01:20 PM CARE MANAGEMENT NOTE 08/07/2014  Patient:  Glen Lee, Glen Lee   Account Number:  [id number]  Date Initiated:  08/06/2014  Documentation initiated by:  Marvetta Gibbons  Subjective/Objective Assessment:   Pt admitted with CHF     Action/Plan:   PTA Pt lived at home- has RW   Anticipated DC Date:  08/08/2014   Anticipated DC Plan:  SKILLED NURSING FACILITY  In-house referral  Clinical Social Worker      DC Planning Services  CM consult      Choice offered to / List presented to:             Status of service:  Completed, signed off Medicare Important Message given?  YES (If response is "NO", the following Medicare IM given date fields will be blank) Date Medicare IM given:  08/07/2014 Medicare IM given by:  Marvetta Gibbons Date Additional Medicare IM given:   Additional Medicare IM given by:    Discharge Disposition:  [place]  Per UR Regulation:  Reviewed for med. necessity/level of care/duration of stay  If discussed at [place] of Stay Meetings, dates discussed:    Comments:  08/07/14- 1000- Marvetta Gibbons RN, BSN [phone] PT recommending SNF- pt agreeable- CSW working on placement-  08/06/14- 1030- Marvetta Gibbons RN, BSN [phone] Referral received for potential HH needs and pt difficulty with cost of meds- spoke with pt at bedside- per conversation with pt today- pt states that he gets his meds via mail order or uses [facility]- pt does not report any problem with cost - pt also reports that he has been to [facility] for rehab recently in [place] (does not remember name) and that he just recently returned home- he does admit to several falls at home- PT/OT evals pending- pt is open to returning to [facility] if needed before returning home again.  Pt has given permission to call his sister if needed regarding d/c plans. NCM to f/u on PT/OT recommendations for d/c needs.

## 2014-08-06 NOTE — Progress Notes (Signed)
[facility] Progress Note   Martavious Hartel WPY:[id number] DOB: 07-04-1933 DOA: 08/04/2014 PCP: PROVIDER NOT IN SYSTEM  Brief narrative: Glen Lee is a 81 y.o. male with past medical history of hypertension and diabetes who presented to the hospital for multiple falls. The patient states that he went to rehabilitation about a month ago and eventually came back home. Once home he began getting weak again and has been falling because his legs are now giving out. In the ER he was noted to be hypoxic on room air and required 4 L of nasal cannula to keep his pulse ox above 90. Apparently he also complained of cough and shortness of breath to the ER doctor. Rectal temp was noted to be 102.3. He was started on treatment for HCAP he was recently in a rehabilitation facility.   Subjective: He has a mild cough which is productive of yellow sputum. No chest pain, shortness of breath and no other complaints.  Assessment/Plan: Principal Problem:  Fever- acute bronchitis- acute respiratory failure - Chest x-ray is clear- repeat a chest x-ray is also negative for consolidation and therefore I suspect that he may have acute bronchitis rather than pneumonia - UA obtained yesterday was negative for a UTI - We will narrow antibiotics to doxycycline today - We have been able to wean him off the oxygen  Active Problems: Hypotension with severe dehydration -Has received numerous fluid boluses and appears to be better hydrated today- we'll continue IV fluids and monitor I&O's and renal function  Atrial fibrillation -He does not appear to have a diagnosis of this in the chart but appears to have been on Xarelto at home which should not be resumed due to his poor creatinine clearance-GFR today is 24- he does state that he was previously using Coumadin - Started short acting Cardizem as he is not on a rate controlling agent and he was tachycardic on admission -due to frequent falls, he is not a candidate for  long-term anticoagulation but while he is here being monitored we can place him on Lovenox - If he is able to gain strength at the nursing facility and is no longer falling, would recommend resuming Coumadin - Holding Norvasc    Diabetes mellitus - Takes Byetta and Januvia at home-has been placed on Trajenta -continue sliding scale insulin -A1c 6.6 reveals relatively good control    CKD (chronic kidney disease) stage 4, GFR 15-29 ml/min - No baseline creatinine to compare with- holding lisinopril  Anemia/ thrombocytopenia -Check stool occults in order anemia panel- as mentioned above we have no old labs to compare this with  BPH -Continue Avodart and Flomax  Code Status: Full code Family Communication:  Disposition Plan: Likely will need to go to skilled nursing facility- awaiting PT eval DVT prophylaxis: Lovenox  Consultants:   Procedures:   Antibiotics: Anti-infectives    Start     Dose/Rate Route Frequency Ordered Stop   08/07/14 0600  vancomycin (VANCOCIN) 1,500 mg in sodium chloride 0.9 % 500 mL IVPB  Status:  Discontinued     1,500 mg 250 mL/hr over 120 Minutes Intravenous Every 48 hours 08/05/14 1201 08/06/14 0920   08/06/14 1000  azithromycin (ZITHROMAX) tablet 250 mg  Status:  Discontinued     250 mg Oral Daily 08/05/14 0900 08/06/14 0918   08/06/14 1000  doxycycline (VIBRA-TABS) tablet 100 mg     100 mg Oral Every 12 hours 08/06/14 0918     08/05/14 1200  vancomycin (VANCOCIN) IVPB 750 mg/150 ml  premix     750 mg 150 mL/hr over 60 Minutes Intravenous NOW 08/05/14 1159 08/05/14 1411   08/05/14 1000  azithromycin (ZITHROMAX) tablet 500 mg     500 mg Oral Daily 08/05/14 0900 08/05/14 0940   08/05/14 0600  vancomycin (VANCOCIN) IVPB 750 mg/150 ml premix  Status:  Discontinued     750 mg 150 mL/hr over 60 Minutes Intravenous Every 24 hours 08/05/14 0102 08/05/14 1200   08/05/14 0600  piperacillin-tazobactam (ZOSYN) IVPB 3.375 g  Status:  Discontinued     3.375  g 12.5 mL/hr over 240 Minutes Intravenous 3 times per day 08/05/14 0102 08/06/14 0920   08/04/14 2330  vancomycin (VANCOCIN) 1,500 mg in sodium chloride 0.9 % 500 mL IVPB  Status:  Discontinued     1,500 mg 250 mL/hr over 120 Minutes Intravenous  Once 08/04/14 2311 08/05/14 0102   08/04/14 2315  piperacillin-tazobactam (ZOSYN) IVPB 3.375 g     3.375 g 100 mL/hr over 30 Minutes Intravenous  Once 08/04/14 2311 08/04/14 2354         Objective: Filed Weights   08/04/14 2300 08/05/14 0102  Weight: 99.338 kg (219 lb) 102.9 kg (226 lb 13.7 oz)    Intake/Output Summary (Last 24 hours) at 08/06/14 1106 Last data filed at 08/06/14 0700  Gross per 24 hour  Intake    280 ml  Output    895 ml  Net   -615 ml     Vitals Filed Vitals:   08/06/14 0417 08/06/14 0500 08/06/14 0506 08/06/14 0700  BP: 80/52 80/46 86/41    Pulse: 68 60 69   Temp:    97.4 F (36.3 C)  TempSrc:    Oral  Resp: 17 16 16    Height:      Weight:      SpO2: 100% 99% 98%     Exam: General: Awake alert oriented 3, No acute respiratory distress Lungs: Clear to auscultation bilaterally without wheezes or crackles-pulse ox is 100% today on room air  Cardiovascular: Regular rate and rhythm without murmur gallop or rub normal S1 and S2 Abdomen: Nontender, nondistended, soft, bowel sounds positive, no rebound, no ascites, no appreciable mass Extremities: No significant cyanosis, clubbing, or edema bilateral lower extremities  Data Reviewed: Basic Metabolic Panel:  Recent Labs Lab 08/04/14 2058 08/05/14 0131 08/06/14 0313  NA 131* 133* 133*  K 3.7 3.7 3.7  CL 103 106 105  CO2 19 18* 16*  GLUCOSE 123* 132* 129*  BUN 50* 51* 58*  CREATININE 2.72* 2.81* 2.97*  CALCIUM 8.0* 7.6* 7.8*   Liver Function Tests:  Recent Labs Lab 08/05/14 0131  AST 37  ALT 26  ALKPHOS 62  BILITOT 0.9  PROT 4.6*  ALBUMIN 2.2*   No results for input(s): LIPASE, AMYLASE in the last 168 hours. No results for input(s):  AMMONIA in the last 168 hours. CBC:  Recent Labs Lab 08/04/14 2058 08/05/14 0131 08/06/14 0832  WBC 4.5 3.4* 5.4  NEUTROABS 4.2  --   --   HGB 8.0* 7.7* 7.7*  HCT 23.5* 23.2* 22.7*  MCV 82.5 85.3 83.2  PLT 97* 77* 99*   Cardiac Enzymes:  Recent Labs Lab 08/04/14 2058 08/05/14 0131 08/05/14 0801 08/05/14 1303  TROPONINI 0.04* 0.03 0.03 0.03   BNP (last 3 results)  Recent Labs  08/04/14 2058  BNP 239.0*    ProBNP (last 3 results) No results for input(s): PROBNP in the last 8760 hours.  CBG:  Recent Labs Lab 08/05/14 1611  08/05/14 2115 08/06/14 0753  GLUCAP 166* 169* 134*    Recent Results (from the past 240 hour(s))  Blood culture (routine x 2)     Status: None (Preliminary result)   Collection Time: 08/04/14  8:49 PM  Result Value Ref Range Status   Specimen Description BLOOD ARM RIGHT  Final   Special Requests BOTTLES DRAWN AEROBIC AND ANAEROBIC 5CC  Final   Culture   Final           BLOOD CULTURE RECEIVED NO GROWTH TO DATE CULTURE WILL BE HELD FOR 5 DAYS BEFORE ISSUING A FINAL NEGATIVE REPORT Performed at [facility]    Report Status PENDING  Incomplete  Blood culture (routine x 2)     Status: None (Preliminary result)   Collection Time: 08/04/14  9:04 PM  Result Value Ref Range Status   Specimen Description BLOOD HAND LEFT  Final   Special Requests BOTTLES DRAWN AEROBIC AND ANAEROBIC 5CC  Final   Culture   Final           BLOOD CULTURE RECEIVED NO GROWTH TO DATE CULTURE WILL BE HELD FOR 5 DAYS BEFORE ISSUING A FINAL NEGATIVE REPORT Performed at [facility]    Report Status PENDING  Incomplete  MRSA PCR Screening     Status: None   Collection Time: 08/05/14  3:07 AM  Result Value Ref Range Status   MRSA by PCR NEGATIVE NEGATIVE Final    Comment:        The GeneXpert MRSA Assay (FDA approved for NASAL specimens only), is one component of a comprehensive MRSA colonization surveillance program. It is not intended to diagnose  MRSA infection nor to guide or monitor treatment for MRSA infections.      Studies:  Recent x-ray studies have been reviewed in detail by the Attending Physician  Scheduled Meds:  Scheduled Meds: . atorvastatin  20 mg Oral q1800  . diltiazem  30 mg Oral 3 times per day  . doxycycline  100 mg Oral Q12H  . ferrous sulfate  325 mg Oral Q breakfast  . finasteride  5 mg Oral Daily  . folic acid  1 mg Oral Daily  . insulin aspart  0-15 Units Subcutaneous TID WC  . insulin aspart  0-5 Units Subcutaneous QHS  . linagliptin  5 mg Oral Daily  . Mesalamine  800 mg Oral TID  . multivitamin with minerals  1 tablet Oral Daily  . pantoprazole  40 mg Oral Daily  . pneumococcal 23 valent vaccine  0.5 mL Intramuscular Tomorrow-1000  . sodium chloride  3 mL Intravenous Q12H  . tamsulosin  0.4 mg Oral Daily  . thiamine  100 mg Oral Daily   Continuous Infusions:   Time spent on care of this patient: 35 minutes   Joy, MD 08/06/2014, 11:06 AM  LOS: 2 days   [facility] Office  [phone] Pager - Text Page per [email or web address]  If 7PM-7AM, please contact night-coverage [email or web address]

## 2014-08-06 NOTE — Progress Notes (Signed)
BP 76/43(51). Paged NP Baltazar Najjar, gave orders for a 1000cc bolus. Will give the bolus and reassess the BP.

## 2014-08-06 NOTE — Progress Notes (Signed)
  Echocardiogram 2D Echocardiogram has been performed.  Glen Lee 08/06/2014, 3:49 PM

## 2014-08-06 NOTE — Evaluation (Signed)
Physical Therapy Evaluation Patient Details Name: Glen Lee MRN: [id number] DOB: 1933/10/04 Today's Date: 08/06/2014   History of Present Illness  pt presents with hx of multiple recent falls and HCAP.    Clinical Impression  Pt generally weak and debilitated, requiring 2 person A for mobility.  Pt SOB and fatigues easily with activity, but O2 sats remain within 90's throughout session while on RA.  At this time feel safest D/C option is for rehab at SNF to give pt time to improve mobility prior to returning to home.  Will continue to follow.      Follow Up Recommendations SNF    Equipment Recommendations  None recommended by PT    Recommendations for Other Services       Precautions / Restrictions Precautions Precautions: Fall Restrictions Weight Bearing Restrictions: No      Mobility  Bed Mobility Overal bed mobility: Needs Assistance;+2 for physical assistance Bed Mobility: Supine to Sit     Supine to sit: Mod assist;+2 for physical assistance;HOB elevated     General bed mobility comments: pt able to A with coming to sitting, but does require A with Bil LEs and trunk.    Transfers Overall transfer level: Needs assistance Equipment used: 2 person hand held assist Transfers: Sit to/from [facility] Sit to Stand: Min assist;+2 physical assistance Stand pivot transfers: Mod assist;+2 physical assistance       General transfer comment: cues for UE use and needed cues repeated with each transfer.  A and facilitation for power up to stand, anterior wt shifting with comign to stand, and cues for step-by-step through pivot.  Repeated transfer x2 for pt attempt at [facility].    Ambulation/Gait                Stairs            Wheelchair Mobility    Modified Rankin (Stroke Patients Only)       Balance Overall balance assessment: Needs assistance Sitting-balance support: Single extremity supported;Feet supported Sitting balance-Leahy  Scale: Poor Sitting balance - Comments: pt begins to lean posteriorly without UE support.   Postural control: Posterior lean Standing balance support: During functional activity Standing balance-Leahy Scale: Poor                               Pertinent Vitals/Pain Pain Assessment: 0-10 Pain Score: 3  Pain Location: Back Pain Descriptors / Indicators: Tightness Pain Intervention(s): Monitored during session;Repositioned    Home Living Family/patient expects to be discharged to:: Skilled nursing facility                      Prior Function Level of Independence: Independent with assistive device(s)         Comments: Per pt he was ambulatory, but falls a lot.  Question how independent pt truly was as he does mention spending a lot of time in bed.       Hand Dominance        Extremity/Trunk Assessment   Upper Extremity Assessment: Generalized weakness           Lower Extremity Assessment: Generalized weakness      Cervical / Trunk Assessment: Kyphotic  Communication   Communication: No difficulties  Cognition Arousal/Alertness: Awake/alert Behavior During Therapy: WFL for tasks assessed/performed Overall Cognitive Status: No family/caregiver present to determine baseline cognitive functioning  General Comments      Exercises        Assessment/Plan    PT Assessment Patient needs continued PT services  PT Diagnosis Difficulty walking;Generalized weakness   PT Problem List Decreased strength;Decreased activity tolerance;Decreased balance;Decreased mobility;Decreased coordination;Decreased knowledge of use of DME;Cardiopulmonary status limiting activity  PT Treatment Interventions DME instruction;Gait training;Functional mobility training;Therapeutic activities;Therapeutic exercise;Balance training;Patient/family education   PT Goals (Current goals can be found in the Care Plan section) Acute Rehab PT  Goals Patient Stated Goal: Not fall so much.   PT Goal Formulation: With patient Time For Goal Achievement: 08/20/14 Potential to Achieve Goals: Good    Frequency Min 2X/week   Barriers to discharge        Co-evaluation               End of Session Equipment Utilized During Treatment: Gait belt Activity Tolerance: Patient limited by fatigue Patient left: in chair;with call bell/phone within reach Nurse Communication: Mobility status         Time: 1110-1136 PT Time Calculation (min) (ACUTE ONLY): 26 min   Charges:   PT Evaluation $Initial PT Evaluation Tier I: 1 Procedure PT Treatments $Therapeutic Activity: 8-22 mins   PT G CodesCatarina Hartshorn, [place] 08/06/2014, 2:12 PM

## 2014-08-07 DIAGNOSIS — A419 Sepsis, unspecified organism: Principal | ICD-10-CM

## 2014-08-07 LAB — FOLATE: Folate: 8.3 ng/mL

## 2014-08-07 LAB — BASIC METABOLIC PANEL
Anion gap: 10 (ref 5–15)
BUN: 56 mg/dL — ABNORMAL HIGH (ref 6–23)
CALCIUM: 8.2 mg/dL — AB (ref 8.4–10.5)
CO2: 17 mmol/L — ABNORMAL LOW (ref 19–32)
Chloride: 102 mmol/L (ref 96–112)
Creatinine, Ser: 2.67 mg/dL — ABNORMAL HIGH (ref 0.50–1.35)
GFR, EST AFRICAN AMERICAN: 25 mL/min — AB (ref >90)
GFR, EST NON AFRICAN AMERICAN: 21 mL/min — AB (ref >90)
Glucose, Bld: 113 mg/dL — ABNORMAL HIGH (ref 70–99)
Potassium: 3.9 mmol/L (ref 3.5–5.1)
SODIUM: 129 mmol/L — AB (ref 135–145)

## 2014-08-07 LAB — CBC
HCT: 23.7 % — ABNORMAL LOW (ref 39.0–52.0)
Hemoglobin: 7.9 g/dL — ABNORMAL LOW (ref 13.0–17.0)
MCH: 27.7 pg (ref 26.0–34.0)
MCHC: 33.3 g/dL (ref 30.0–36.0)
MCV: 83.2 fL (ref 78.0–100.0)
PLATELETS: 111 10*3/uL — AB (ref 150–400)
RBC: 2.85 MIL/uL — AB (ref 4.22–5.81)
RDW: 14.3 % (ref 11.5–15.5)
WBC: 4.7 10*3/uL (ref 4.0–10.5)

## 2014-08-07 LAB — GLUCOSE, CAPILLARY
GLUCOSE-CAPILLARY: 158 mg/dL — AB (ref 70–99)
Glucose-Capillary: 114 mg/dL — ABNORMAL HIGH (ref 70–99)
Glucose-Capillary: 126 mg/dL — ABNORMAL HIGH (ref 70–99)

## 2014-08-07 LAB — IRON AND TIBC
IRON: 20 ug/dL — AB (ref 42–165)
Saturation Ratios: 14 % — ABNORMAL LOW (ref 20–55)
TIBC: 144 ug/dL — ABNORMAL LOW (ref 215–435)
UIBC: 124 ug/dL — ABNORMAL LOW (ref 125–400)

## 2014-08-07 LAB — FERRITIN: Ferritin: 449 ng/mL — ABNORMAL HIGH (ref 22–322)

## 2014-08-07 LAB — VITAMIN B12: Vitamin B-12: 335 pg/mL (ref 211–911)

## 2014-08-07 MED ORDER — DILTIAZEM HCL ER COATED BEADS 120 MG PO CP24
120.0000 mg | ORAL_CAPSULE | Freq: Every day | ORAL | Status: DC
  Administered 2014-08-07: 120 mg via ORAL
  Filled 2014-08-07: qty 1

## 2014-08-07 MED ORDER — DARBEPOETIN ALFA 40 MCG/0.4ML IJ SOSY
40.0000 ug | PREFILLED_SYRINGE | Freq: Once | INTRAMUSCULAR | Status: AC
  Administered 2014-08-07: 40 ug via SUBCUTANEOUS
  Filled 2014-08-07: qty 0.4

## 2014-08-07 MED ORDER — DOXYCYCLINE HYCLATE 100 MG PO TABS: 100.0000 mg | ORAL_TABLET | Freq: Two times a day (BID) | ORAL | Status: DC

## 2014-08-07 MED ORDER — DILTIAZEM HCL ER COATED BEADS 120 MG PO CP24: 120.0000 mg | ORAL_CAPSULE | Freq: Every day | ORAL | Status: DC

## 2014-08-07 NOTE — Clinical Social Work Note (Addendum)
CSW was informed by RN that MD would like to discharge the pt to SNF. CSW spoke with MD regarding discharge request. CSW informed MD that pt would need insurance authorization before SNF placement. MD insist the pt will be discharge despite the CSW effort to explained the SNF process and the short notice decision.     CSW will met with the pt to assess for SNF placement. Assessment to follow.   [place], MSW, [place]

## 2014-08-07 NOTE — Progress Notes (Signed)
Medicare Important Message given? YES  (If response is "NO", the following Medicare IM given date fields will be blank)  Date Medicare IM given: 08/07/14 Medicare IM given by:  Dahlia Client [employer]

## 2014-08-07 NOTE — Clinical Social Work Placement (Addendum)
Clinical Social Work Department CLINICAL SOCIAL WORK PLACEMENT NOTE 08/07/2014  Patient:  Glen Lee, Glen Lee  Account Number:  [id number] Admit date:  08/04/2014  Clinical Social Worker:  Paulette Blanch Jarrett [place], LCSWA  Date/time:  08/07/2014 11:13 AM  Clinical Social Work is seeking post-discharge placement for this patient at the following level of care:   SKILLED NURSING   (*CSW will update this form in Epic as items are completed)   08/07/2014  Patient/family provided with [place] Department of Clinical Social Work's list of facilities offering this level of care within the geographic area requested by the patient (or if unable, by the patient's family).  08/07/2014  Patient/family informed of their freedom to choose among providers that offer the needed level of care, that participate in Medicare, Medicaid or managed care program needed by the patient, have an available bed and are willing to accept the patient.  08/07/2014  Patient/family informed of [facility]' ownership interest in [facility], as well as of the fact that they are under no obligation to receive care at this facility.  PASARR submitted to EDS on 08/07/2014 PASARR number received on 08/07/2014  FL2 transmitted to all facilities in geographic area requested by pt/family on  08/07/2014 FL2 transmitted to all facilities within larger geographic area on 08/07/2014  Patient informed that his/her managed care company has contracts with or will negotiate with  certain facilities, including the following:     Patient/family informed of bed offers received:  08/07/2014 Patient chooses bed at [facility] Physician recommends and patient chooses bed at    Patient to be transferred to [facility] on 08/07/2014 Patient to be transferred to facility by [facility]  Patient and family notified of transfer on 08/07/2014 Name of family member notified:  Quintella Reichert, sister   The following physician  request were entered in Epic:   Additional Comments:  Greta Doom, MSW, [place]

## 2014-08-07 NOTE — Progress Notes (Signed)
Called report to [place] at [facility]. Discussed AVS with pt, pt verbalizes understanding. Elink and CCMD notified of discharge, IV and monitor removed. VSS, pt alert and oriented at baseline. All personal belongings with pt, including clothing, glasses, dentures, and cell phone. Pt picked up by [place] at 1725.

## 2014-08-07 NOTE — Clinical Social Work Psychosocial (Signed)
Clinical Social Work Department BRIEF PSYCHOSOCIAL ASSESSMENT 08/07/2014  Patient:  Glen Lee, Glen Lee     Account Number:  [id number]     Admit date:  08/04/2014  Clinical Social Worker:  Marciano Sequin  Date/Time:  08/07/2014 11:00 AM  Referred by:  RN  Date Referred:  08/07/2014 Referred for  SNF Placement   Other Referral:   Interview type:  Patient Other interview type:    PSYCHOSOCIAL DATA Living Status:  ALONE Admitted from facility:   Level of care:   Primary support name:  [place] Primary support relationship to patient:  [place] Degree of support available:   Strong Support    CURRENT CONCERNS Current Concerns  None Noted   Other Concerns:    SOCIAL WORK ASSESSMENT / PLAN CSW met the pt at the bedside. CSW introduced self and purpose of the visit. CSW discussed clinical recommendation for SNF rehab. CSW inquired about the geographical location in which the pt would like to receive rehab from. Pt reported he would like to stay in [facility]. CSW explained the SNF rehab process to the pt. CSW and pt discussed insurance and its relation to SNF rehab. CSW answered all questions in which the pt inquired about. CSW provided pt with contact information for further questions. CSW will continue to follow this pt and assist with discharge as needed.   Assessment/plan status:  Psychosocial Support/Ongoing Assessment of Needs Other assessment/ plan:   Information/referral to community resources:    PATIENT'S/FAMILY'S RESPONSE TO CURRENT DIAGNOSE: Pt presented with a normal affect and mood. Pt acknowledged his current active diagnoses as being a foreseen on-going problem.   PATIENT'S/FAMILY'S RESPONSE TO PLAN OF CARE: Pt is in agrees with transitioning to a SNF for rehab.   [place], MSW, [place]

## 2014-08-07 NOTE — Discharge Summary (Addendum)
Physician Discharge Summary  Glen Lee CNO:[id number] DOB: 11-Oct-1933 DOA: 08/04/2014  PCP: PROVIDER NOT IN SYSTEM  Admit date: 08/04/2014 Discharge date: 08/07/2014  Time spent:50 minutes  Recommendations for Outpatient Follow-up:  If he is able to gain strength at the nursing facility and is no longer falling, would recommend resuming Coumadin for A-fib Need 3 sets of stool occults Needs referral to nephrologist- need further treatment of anemia with Aranesp Bmet in 1 wk  Discharge Condition: stable Diet recommendation: hear healthy, low sodium, carb modified  Discharge Diagnoses:  Principal Problem:   Sepsis Active Problems:   Acute bronchitis   Diabetes mellitus   Hypotension   CKD (chronic kidney disease) stage 4, GFR 15-29 ml/min   Persistent atrial fibrillation   History of present illness:  Glen Lee is a 81 y.o. male with past medical history of hypertension and diabetes who presented to the hospital for multiple falls. The patient states that he went to rehabilitation about a month ago and eventually came back home. Once home he began getting weak again and has been falling because his legs are now giving out. In the ER he was noted to be hypoxic on room air and required 4 L of nasal cannula to keep his pulse ox above 90. Apparently he also complained of cough and shortness of breath to the ER doctor. Rectal temp was noted to be 102.3. He was started on treatment for HCAP he was recently in a rehabilitation facility.  Hospital Course:  Principal Problem: Fever- acute bronchitis- acute respiratory failure- Sepsis - Chest x-ray is clear- repeat a chest x-ray PA/LAT is also negative for consolidation and therefore I suspect that he may have acute bronchitis rather than pneumonia - UA obtained was negative for a UTI - narrowed antibiotics to doxycycline on 2/25- recommend 5 more days from 2/26 - We have been able to wean him off the oxygen  Active  Problems: Hypotension with severe dehydration -Has received numerous fluid boluses and is much better hydrated-  Atrial fibrillation -He does not appear to have a diagnosis of this in the chart but was on Xarelto at home which should not be resumed due to his poor creatinine clearance-GFR today is 21- he does state that he was previously using Coumadin - Started Cardizem as he is not on a rate controlling agent and he was tachycardic on admission -due to frequent falls, he is not a candidate for long-term anticoagulation but while he is here being monitored did give him Lovenox - If he is able to gain strength at the nursing facility and is no longer falling, would recommend resuming Coumadin - Holding Norvasc   Diabetes mellitus - Takes Byetta and Januvia at home-has been placed on Trajenta -continue sliding scale insulin -A1c 6.6 reveals relatively good control   CKD (chronic kidney disease) stage 4, GFR 15-29 ml/min - No baseline creatinine to compare with- holding lisinopril which he was on at home  Anemia- due to chronic renal failure- AOCD -recommend checking stool occults at nursing home - anemia panel reveals AOCD  - have given a dose of Aranesp today Ref Range 08/06/14 1237     Iron 42 - 165 ug/dL 20 (L)   TIBC 215 - 435 ug/dL 144 (L)   Saturation Ratios 20 - 55 % 14 (L)   UIBC 125 - 400 ug/dL 124 (L)   Comments: Performed at [facility]        BPH -Continue Avodart and Flomax- was on Proscar  at home as well which I have discontinued   Procedures:  none  Consultations:  none  Discharge Exam: Filed Weights   08/04/14 2300 08/05/14 0102  Weight: 99.338 kg (219 lb) 102.9 kg (226 lb 13.7 oz)   Filed Vitals:   08/07/14 0700  BP:   Pulse:   Temp: 97.8 F (36.6 C)  Resp:     General: AAO x 3, no distress Cardiovascular: RRR, no murmurs  Respiratory: clear to auscultation bilaterally GI: soft, non-tender, non-distended, bowel sound  positive  Discharge Instructions You were cared for by a hospitalist during your hospital stay. If you have any questions about your discharge medications or the care you received while you were in the hospital after you are discharged, you can call the unit and asked to speak with the hospitalist on call if the hospitalist that took care of you is not available. Once you are discharged, your primary care physician will handle any further medical issues. Please note that NO REFILLS for any discharge medications will be authorized once you are discharged, as it is imperative that you return to your primary care physician (or establish a relationship with a primary care physician if you do not have one) for your aftercare needs so that they can reassess your need for medications and monitor your lab values.      Discharge Instructions    Diet - low sodium heart healthy    Complete by:  As directed      Increase activity slowly    Complete by:  As directed             Medication List    STOP taking these medications        amLODipine 5 MG tablet  Commonly known as:  NORVASC     dutasteride 0.5 MG capsule  Commonly known as:  AVODART     lisinopril 20 MG tablet  Commonly known as:  PRINIVIL,ZESTRIL     rivaroxaban 20 MG Tabs tablet  Commonly known as:  XARELTO      TAKE these medications        diltiazem 120 MG 24 hr capsule  Commonly known as:  CARDIZEM CD  Take 1 capsule (120 mg total) by mouth daily.     doxycycline 100 MG tablet  Commonly known as:  VIBRA-TABS  Take 1 tablet (100 mg total) by mouth every 12 (twelve) hours.     exenatide 5 MCG/0.02ML Sopn injection  Commonly known as:  BYETTA  Inject 5 mcg into the skin daily as needed (bs > 130).     ferrous sulfate 325 (65 FE) MG tablet  Take 325 mg by mouth daily with breakfast.     finasteride 5 MG tablet  Commonly known as:  PROSCAR  Take 5 mg by mouth daily.     Mesalamine 800 MG Tbec  Take 800 mg by mouth  3 (three) times daily.     Omega-3 Fish Oil 300 MG Caps  Take 300 mg by mouth daily.     oxymetazoline 0.05 % nasal spray  Commonly known as:  AFRIN  Place 1 spray into both nostrils 2 (two) times daily as needed for congestion.     pantoprazole 40 MG tablet  Commonly known as:  PROTONIX  Take 40 mg by mouth daily.     POTASSIUM GLUCONATE PO  Take 1 tablet by mouth daily.     simvastatin 40 MG tablet  Commonly known as:  ZOCOR  Take  40 mg by mouth daily.     sitaGLIPtin 25 MG tablet  Commonly known as:  JANUVIA  Take 25 mg by mouth daily.     tamsulosin 0.4 MG Caps capsule  Commonly known as:  FLOMAX  Take 0.4 mg by mouth daily.     Vitamin D (Ergocalciferol) 50000 UNITS Caps capsule  Commonly known as:  DRISDOL  Take 50,000 Units by mouth every 7 (seven) days.     Vitamin D3 3000 UNITS Tabs  Take 1 tablet by mouth daily.       Allergies  Allergen Reactions  . Keflet [Cephalexin] Rash      The results of significant diagnostics from this hospitalization (including imaging, microbiology, ancillary and laboratory) are listed below for reference.    Significant Diagnostic Studies: Dg Chest 2 View  08/05/2014   CLINICAL DATA:  Pneumonia.  EXAM: CHEST  2 VIEW  COMPARISON:  08/04/2014  FINDINGS: Cardiomegaly. Bilateral pleural effusions noted on the lateral view. Left base atelectasis or early infiltrate. No confluent opacity on the right. No acute bony abnormality.  IMPRESSION: Bilateral pleural effusions.  Left base atelectasis or infiltrate.  Cardiomegaly.   Electronically Signed   By: Rolm Baptise M.D.   On: 08/05/2014 16:55   Dg Chest Port 1 View  08/04/2014   CLINICAL DATA:  Abnormal ECG, fall with weakness.  EXAM: PORTABLE CHEST - 1 VIEW  COMPARISON:  Chest radiograph August 03, 2014  FINDINGS: The cardiac silhouette is mildly enlarged. Low inspiratory examination with crowded vascular markings, mild pulmonary vascular congestion. No plural effusion or focal  consolidation. No pneumothorax.  Osteopenia. Moderate degenerative change of the shoulders. Reported potential RIGHT rib fracture arm prior imaging is less apparent on portable radiography. Vascular calcifications in the neck.  IMPRESSION: Mild cardiomegaly and pulmonary vascular congestion.   Electronically Signed   By: Elon Alas   On: 08/04/2014 21:20    Microbiology: Recent Results (from the past 240 hour(s))  Blood culture (routine x 2)     Status: None (Preliminary result)   Collection Time: 08/04/14  8:49 PM  Result Value Ref Range Status   Specimen Description BLOOD ARM RIGHT  Final   Special Requests BOTTLES DRAWN AEROBIC AND ANAEROBIC 5CC  Final   Culture   Final           BLOOD CULTURE RECEIVED NO GROWTH TO DATE CULTURE WILL BE HELD FOR 5 DAYS BEFORE ISSUING A FINAL NEGATIVE REPORT Performed at [facility]    Report Status PENDING  Incomplete  Blood culture (routine x 2)     Status: None (Preliminary result)   Collection Time: 08/04/14  9:04 PM  Result Value Ref Range Status   Specimen Description BLOOD HAND LEFT  Final   Special Requests BOTTLES DRAWN AEROBIC AND ANAEROBIC 5CC  Final   Culture   Final           BLOOD CULTURE RECEIVED NO GROWTH TO DATE CULTURE WILL BE HELD FOR 5 DAYS BEFORE ISSUING A FINAL NEGATIVE REPORT Performed at [facility]    Report Status PENDING  Incomplete  MRSA PCR Screening     Status: None   Collection Time: 08/05/14  3:07 AM  Result Value Ref Range Status   MRSA by PCR NEGATIVE NEGATIVE Final    Comment:        The GeneXpert MRSA Assay (FDA approved for NASAL specimens only), is one component of a comprehensive MRSA colonization surveillance program. It is not intended  to diagnose MRSA infection nor to guide or monitor treatment for MRSA infections.      Labs: Basic Metabolic Panel:  Recent Labs Lab 08/04/14 2058 08/05/14 0131 08/06/14 0313 08/07/14 0305  NA 131* 133* 133* 129*  K 3.7 3.7 3.7 3.9   CL 103 106 105 102  CO2 19 18* 16* 17*  GLUCOSE 123* 132* 129* 113*  BUN 50* 51* 58* 56*  CREATININE 2.72* 2.81* 2.97* 2.67*  CALCIUM 8.0* 7.6* 7.8* 8.2*   Liver Function Tests:  Recent Labs Lab 08/05/14 0131  AST 37  ALT 26  ALKPHOS 62  BILITOT 0.9  PROT 4.6*  ALBUMIN 2.2*   No results for input(s): LIPASE, AMYLASE in the last 168 hours. No results for input(s): AMMONIA in the last 168 hours. CBC:  Recent Labs Lab 08/04/14 2058 08/05/14 0131 08/06/14 0832 08/07/14 0305  WBC 4.5 3.4* 5.4 4.7  NEUTROABS 4.2  --   --   --   HGB 8.0* 7.7* 7.7* 7.9*  HCT 23.5* 23.2* 22.7* 23.7*  MCV 82.5 85.3 83.2 83.2  PLT 97* 77* 99* 111*   Cardiac Enzymes:  Recent Labs Lab 08/04/14 2058 08/05/14 0131 08/05/14 0801 08/05/14 1303  TROPONINI 0.04* 0.03 0.03 0.03   BNP: BNP (last 3 results)  Recent Labs  08/04/14 2058  BNP 239.0*    ProBNP (last 3 results) No results for input(s): PROBNP in the last 8760 hours.  CBG:  Recent Labs Lab 08/06/14 0753 08/06/14 1153 08/06/14 1627 08/06/14 2115 08/07/14 0731  GLUCAP 134* 187* 176* 134* 114*       SignedDebbe Odea, MD [facility] 08/07/2014, 10:26 AM

## 2014-08-07 NOTE — Clinical Social Work Note (Signed)
CSW contact by the pt. The pt reported that he does not wish to transition to [facility]. The pt express desire to transition to [facility]. CSW provided  [place] with updated. CSW contacted [facility] regarding pt's requested. Tammy at [facility] reported that she will start insurance authorization.  CSW paged MD to sign FL2 form.  CSW will continue to follow.   [place], MSW, [place]

## 2014-08-11 ENCOUNTER — Encounter: Payer: Self-pay | Admitting: Internal Medicine

## 2014-08-11 ENCOUNTER — Non-Acute Institutional Stay (SKILLED_NURSING_FACILITY): Payer: Medicare HMO | Admitting: Internal Medicine

## 2014-08-11 DIAGNOSIS — J209 Acute bronchitis, unspecified: Secondary | ICD-10-CM

## 2014-08-11 DIAGNOSIS — I959 Hypotension, unspecified: Secondary | ICD-10-CM | POA: Diagnosis not present

## 2014-08-11 DIAGNOSIS — E119 Type 2 diabetes mellitus without complications: Secondary | ICD-10-CM | POA: Diagnosis not present

## 2014-08-11 DIAGNOSIS — N184 Chronic kidney disease, stage 4 (severe): Secondary | ICD-10-CM | POA: Diagnosis not present

## 2014-08-11 DIAGNOSIS — I480 Paroxysmal atrial fibrillation: Secondary | ICD-10-CM | POA: Diagnosis not present

## 2014-08-11 LAB — CULTURE, BLOOD (ROUTINE X 2)
CULTURE: NO GROWTH
Culture: NO GROWTH

## 2014-08-11 NOTE — Progress Notes (Signed)
Patient ID: Glen Lee, male   DOB: [birth year], 81 y.o.   MRN: [id number]    HISTORY AND PHYSICAL  Location:  [place] of Service: SNF [phone])   Extended Emergency Contact Information Primary Emergency Contact: [place] Phone: [phone] Relation: Sister Secondary Emergency Contact: [place] Phone: [phone] Relation: Niece  Advanced Directive information  FULL CODE  Chief Complaint  Patient presents with  . New Admit To SNF - sepsis w hypotension, acute bronchitis, DM II 9A1c 6.6%), afib, CKD 4, falls, anemia of chronic disease    HPI:  81 yo male seen today as a new admission into SNF for short rehab following hospital admission for sepsis and acute bronchitis.he will take Doxy for 5 additional days. He reports no f/c, CP or SOB. No N/V. CBGs 160-210s. No low BS reactions. No bloody stools. He is taking iron supplement. Eating well. No sleeping issues.   afib rate controlled and BP controlled on cardizem.  He takes [place] for DM.   Past Medical History  Diagnosis Date  . Diabetes mellitus without complication   . Hypertension     No past surgical history on file.  Patient Care Team: Provider Not In System as PCP - General  History   Social History  . Marital Status: Single    Spouse Name: N/A  . Number of Children: N/A  . Years of Education: N/A   Occupational History  . Not on file.   Social History Main Topics  . Smoking status: Former Smoker    Types: Cigarettes    Quit date: 08/04/1992  . Smokeless tobacco: Not on file  . Alcohol Use: No  . Drug Use: Not on file  . Sexual Activity: Not on file   Other Topics Concern  . Not on file   Social History Narrative     reports that he quit smoking about 22 years ago. His smoking use included Cigarettes. He does not have any smokeless tobacco history on file. He reports that  he does not drink alcohol. His drug history is not on file.  No family history on file. No family status information on file.    There is no immunization history for the selected administration types on file for this patient.  Allergies  Allergen Reactions  . Keflet [Cephalexin] Rash    Medications: Patient's Medications  New Prescriptions   No medications on file  Previous Medications   CHOLECALCIFEROL (VITAMIN D3) 3000 UNITS TABS    Take 1 tablet by mouth daily.   DILTIAZEM (CARDIZEM CD) 120 MG 24 HR CAPSULE    Take 1 capsule (120 mg total) by mouth daily.   DOXYCYCLINE (VIBRA-TABS) 100 MG TABLET    Take 1 tablet (100 mg total) by mouth every 12 (twelve) hours.   EXENATIDE (BYETTA) 5 MCG/0.02ML SOPN INJECTION    Inject 5 mcg into the skin daily as needed (bs > 130).   FERROUS SULFATE 325 (65 FE) MG TABLET    Take 325 mg by mouth daily with breakfast.   FINASTERIDE (PROSCAR) 5 MG TABLET    Take 5 mg by mouth daily.   MESALAMINE 800 MG TBEC    Take 800 mg by mouth 3 (three) times daily.   OMEGA-3 FATTY ACIDS (OMEGA-3 FISH OIL) 300 MG CAPS    Take 300 mg by mouth daily.   OXYMETAZOLINE (AFRIN) 0.05 % NASAL SPRAY  Place 1 spray into both nostrils 2 (two) times daily as needed for congestion.   PANTOPRAZOLE (PROTONIX) 40 MG TABLET    Take 40 mg by mouth daily.   POTASSIUM GLUCONATE PO    Take 1 tablet by mouth daily.   SIMVASTATIN (ZOCOR) 40 MG TABLET    Take 40 mg by mouth daily.   SITAGLIPTIN (JANUVIA) 25 MG TABLET    Take 25 mg by mouth daily.   TAMSULOSIN (FLOMAX) 0.4 MG CAPS CAPSULE    Take 0.4 mg by mouth daily.   VITAMIN D, ERGOCALCIFEROL, (DRISDOL) 50000 UNITS CAPS CAPSULE    Take 50,000 Units by mouth every 7 (seven) days.  Modified Medications   No medications on file  Discontinued Medications   No medications on file    Review of Systems  Constitutional: Positive for fatigue. Negative for chills and activity change.  HENT: Negative for sore throat and trouble  swallowing.   Eyes: Negative for visual disturbance.  Respiratory: Negative for cough, chest tightness and shortness of breath.   Cardiovascular: Negative for chest pain, palpitations and leg swelling.  Gastrointestinal: Negative for nausea, vomiting, abdominal pain and blood in stool.  Genitourinary: Negative for urgency, frequency and difficulty urinating.  Musculoskeletal: Negative for arthralgias and gait problem.  Skin: Negative for rash.  Neurological: Negative for weakness and headaches.  Psychiatric/Behavioral: Negative for confusion and sleep disturbance. The patient is not nervous/anxious.     Filed Vitals:   08/11/14 1634  BP: 134/56  Pulse: 72  Temp: 97.6 F (36.4 C)  Weight: 229 lb (103.874 kg)   Body mass index is 31.05 kg/(m^2).  Physical Exam  Constitutional: He is oriented to person, place, and time. No distress.  Frail appearing in NAD  HENT:  Mouth/Throat: Oropharynx is clear and moist.  Eyes: Pupils are equal, round, and reactive to light. No scleral icterus.  Neck: Neck supple. No thyromegaly present.  Cardiovascular: Normal rate, regular rhythm and intact distal pulses.  Exam reveals no gallop and no friction rub.   Murmur (1/6 SEM) heard. No carotid bruit b/l; 2+ pitting LE edema. No calf TTP  Pulmonary/Chest: Effort normal and breath sounds normal. He has no wheezes. He has no rales. He exhibits no tenderness.  Abdominal: Soft. Bowel sounds are normal. He exhibits no distension, no abdominal bruit, no pulsatile midline mass and no mass. There is no tenderness. There is no rebound and no guarding.  Musculoskeletal: He exhibits edema and tenderness.  R>L hand swelling with strength 3/5 in extremities  Lymphadenopathy:    He has no cervical adenopathy.  Neurological: He is alert and oriented to person, place, and time.  Skin: Skin is warm and dry. No rash noted.  Psychiatric: He has a normal mood and affect. His behavior is normal. Judgment and thought  content normal.     Labs reviewed: Admission on 08/04/2014, Discharged on 08/07/2014  No results displayed because visit has over 200 results.     CBC Latest Ref Rng 08/07/2014 08/06/2014 08/05/2014  WBC 4.0 - 10.5 K/uL 4.7 5.4 3.4(L)  Hemoglobin 13.0 - 17.0 g/dL 7.9(L) 7.7(L) 7.7(L)  Hematocrit 39.0 - 52.0 % 23.7(L) 22.7(L) 23.2(L)  Platelets 150 - 400 K/uL 111(L) 99(L) 77(L)    CMP Latest Ref Rng 08/07/2014 08/06/2014 08/05/2014  Glucose 70 - 99 mg/dL 113(H) 129(H) 132(H)  BUN 6 - 23 mg/dL 56(H) 58(H) 51(H)  Creatinine 0.50 - 1.35 mg/dL 2.67(H) 2.97(H) 2.81(H)  Sodium 135 - 145 mmol/L 129(L) 133(L) 133(L)  Potassium  3.5 - 5.1 mmol/L 3.9 3.7 3.7  Chloride 96 - 112 mmol/L 102 105 106  CO2 19 - 32 mmol/L 17(L) 16(L) 18(L)  Calcium 8.4 - 10.5 mg/dL 8.2(L) 7.8(L) 7.6(L)  Total Protein 6.0 - 8.3 g/dL - - 4.6(L)  Total Bilirubin 0.3 - 1.2 mg/dL - - 0.9  Alkaline Phos 39 - 117 U/L - - 62  AST 0 - 37 U/L - - 37  ALT 0 - 53 U/L - - 26      Dg Chest 2 View  08/05/2014   CLINICAL DATA:  Pneumonia.  EXAM: CHEST  2 VIEW  COMPARISON:  08/04/2014  FINDINGS: Cardiomegaly. Bilateral pleural effusions noted on the lateral view. Left base atelectasis or early infiltrate. No confluent opacity on the right. No acute bony abnormality.  IMPRESSION: Bilateral pleural effusions.  Left base atelectasis or infiltrate.  Cardiomegaly.   Electronically Signed   By: Rolm Baptise M.D.   On: 08/05/2014 16:55   Dg Chest Port 1 View  08/04/2014   CLINICAL DATA:  Abnormal ECG, fall with weakness.  EXAM: PORTABLE CHEST - 1 VIEW  COMPARISON:  Chest radiograph August 03, 2014  FINDINGS: The cardiac silhouette is mildly enlarged. Low inspiratory examination with crowded vascular markings, mild pulmonary vascular congestion. No plural effusion or focal consolidation. No pneumothorax.  Osteopenia. Moderate degenerative change of the shoulders. Reported potential RIGHT rib fracture arm prior imaging is less apparent on  portable radiography. Vascular calcifications in the neck.  IMPRESSION: Mild cardiomegaly and pulmonary vascular congestion.   Electronically Signed   By: Elon Alas   On: 08/04/2014 21:20   Hospital records reviewed - his xeralto was stopped due to reduced renal fxn. Recommended start coumadin once falls reduced. He was dc/d on doxy   Assessment/Plan    ICD-9-CM ICD-10-CM   1. PAF (paroxysmal atrial fibrillation) - rate controlled on cardizem 427.31 I48.0   2. Type 2 diabetes mellitus without complication - stable on byetta and januvuia 250.00 E11.9   3. CKD (chronic kidney disease) stage 4, GFR 15-29 ml/min - stable 585.4 N18.4   4. Acute bronchitis, unspecified organism - resolved 466.0 J20.9   5. Hypotension, unspecified hypotension type - resolved 458.9 I95.9     --hemoccult stool x 3  --t/c resuming coumadin once fall risk reduced  --check BMP in 1 week  --refer to nephrology for stage 4 CKD  --complete 5 additional days of doxy  -- PT as ordered  --will follow  --GOAL: short term rehab and d/c home when medically appropriate. Communicated with pt and nursing.  Stanislaw Acton S. Perlie Gold  [facility] [address] [place], [place] [address] [phone] Office (Wednesdays and Fridays 8 AM - 5 PM) [phone] Cell (Monday-Friday 8 AM - 5 PM)

## 2014-08-17 ENCOUNTER — Non-Acute Institutional Stay (SKILLED_NURSING_FACILITY): Payer: Medicare HMO | Admitting: Adult Health

## 2014-08-17 DIAGNOSIS — L853 Xerosis cutis: Secondary | ICD-10-CM

## 2014-08-17 DIAGNOSIS — R609 Edema, unspecified: Secondary | ICD-10-CM | POA: Diagnosis not present

## 2014-09-16 DIAGNOSIS — I480 Paroxysmal atrial fibrillation: Secondary | ICD-10-CM | POA: Insufficient documentation

## 2014-09-20 NOTE — Progress Notes (Signed)
Patient ID: Glen Lee, male   DOB: August 23, 1933, 81 y.o.   MRN: [id number]  Glen Lee living [place]     Allergies  Allergen Reactions  . Keflet [Cephalexin] Rash       Chief Complaint  Patient presents with  . Acute Visit    patient concerns     HPI:  He has worsening edema present  in his bilateral lower extremities. He states the edema makes his feet uncomfortable and tender. He also has dry skin on his bilateral thighs and lower back. The nursing staff is wondering what else can be done for his edema.   Past Medical History  Diagnosis Date  . Diabetes mellitus without complication   . Hypertension     No past surgical history on file.  VITAL SIGNS BP 120/57 mmHg  Pulse 69  Ht 5' 10"  (1.778 m)  Wt 224 lb (101.606 kg)  BMI 32.14 kg/m2   Outpatient Encounter Prescriptions as of 08/17/2014  Medication Sig  . Cholecalciferol (VITAMIN D3) 3000 UNITS TABS Take 1000 units daily   . diltiazem (CARDIZEM CD) 120 MG 24 hr capsule Take 1 capsule (120 mg total) by mouth daily.  Marland Kitchen exenatide (BYETTA) 5 MCG/0.02ML SOPN injection Inject 5 mcg into the skin daily as needed (bs > 130).  . ferrous sulfate 325 (65 FE) MG tablet Take 325 mg by mouth daily with breakfast.  . finasteride (PROSCAR) 5 MG tablet Take 5 mg by mouth daily.  . Mesalamine 800 MG TBEC Take 800 mg by mouth 3 (three) times daily.  . Omega-3 Fatty Acids (OMEGA-3 FISH OIL) 300 MG CAPS Take 300 mg by mouth daily.  Marland Kitchen oxymetazoline (AFRIN) 0.05 % nasal spray Place 1 spray into both nostrils 2 (two) times daily as needed for congestion.  . pantoprazole (PROTONIX) 40 MG tablet Take 40 mg by mouth daily.  . simvastatin (ZOCOR) 40 MG tablet Take 40 mg by mouth daily.  . sitaGLIPtin (JANUVIA) 25 MG tablet Take 25 mg by mouth daily.  . tamsulosin (FLOMAX) 0.4 MG CAPS capsule Take 0.4 mg by mouth daily.  . Vitamin D, Ergocalciferol, (DRISDOL) 50000 UNITS CAPS capsule Take 50,000 Units by mouth every 7 (seven) days.      SIGNIFICANT DIAGNOSTIC EXAMS    Review of Systems  Constitutional: Negative for malaise/fatigue.  HENT: Negative.   Respiratory: Negative for cough and shortness of breath.   Cardiovascular: Positive for leg swelling. Negative for chest pain and palpitations.  Gastrointestinal: Negative for abdominal pain.  Musculoskeletal: Negative for myalgias and joint pain.  Skin:       Has dry skin      Physical Exam  Constitutional: No distress.  Neck: Neck supple. No JVD present.  Cardiovascular: Normal rate, regular rhythm and intact distal pulses.   Respiratory: Effort normal and breath sounds normal. No respiratory distress.  GI: Soft. Bowel sounds are normal. He exhibits no distension.  Musculoskeletal: He exhibits edema.  Is able to move all extremities Has 3+ lower extremity edema   Neurological: He is alert.  Skin: Skin is warm and dry. He is not diaphoretic.  Has dry skin on thighs and lower back        ASSESSMENT/ PLAN:  1. Edema: will begin demadex 10 mg daily and will check bmp on Friday; will wear ted hose daily will monitor   2. Dry skin: will begin eucerin cram to dry skin twice daily    Ok Edwards NP [facility]  Contact [phone] Monday through  Friday 8am- 5pm  After hours call [phone]

## 2015-04-09 ENCOUNTER — Encounter: Payer: Self-pay | Admitting: Vascular Surgery

## 2015-04-09 ENCOUNTER — Other Ambulatory Visit: Payer: Self-pay | Admitting: *Deleted

## 2015-04-09 DIAGNOSIS — M79604 Pain in right leg: Secondary | ICD-10-CM

## 2015-04-09 DIAGNOSIS — M7989 Other specified soft tissue disorders: Secondary | ICD-10-CM

## 2015-05-11 ENCOUNTER — Encounter: Payer: Self-pay | Admitting: Vascular Surgery

## 2015-05-14 ENCOUNTER — Encounter: Payer: Self-pay | Admitting: Vascular Surgery

## 2015-05-14 ENCOUNTER — Ambulatory Visit (HOSPITAL_COMMUNITY)
Admission: RE | Admit: 2015-05-14 | Discharge: 2015-05-14 | Disposition: A | Discharge status: Home / Self Care | Payer: Medicare HMO | Source: Ambulatory Visit | Attending: Vascular Surgery | Admitting: Vascular Surgery

## 2015-05-14 ENCOUNTER — Ambulatory Visit (INDEPENDENT_AMBULATORY_CARE_PROVIDER_SITE_OTHER): Payer: Medicare HMO | Admitting: Vascular Surgery

## 2015-05-14 VITALS — BP 149/66 | HR 59 | Ht 70.0 in | Wt 207.4 lb

## 2015-05-14 DIAGNOSIS — M7989 Other specified soft tissue disorders: Secondary | ICD-10-CM | POA: Diagnosis not present

## 2015-05-14 DIAGNOSIS — I83891 Varicose veins of right lower extremities with other complications: Secondary | ICD-10-CM | POA: Insufficient documentation

## 2015-05-14 DIAGNOSIS — M79604 Pain in right leg: Secondary | ICD-10-CM | POA: Diagnosis not present

## 2015-05-14 DIAGNOSIS — I83893 Varicose veins of bilateral lower extremities with other complications: Secondary | ICD-10-CM | POA: Diagnosis not present

## 2015-05-14 DIAGNOSIS — E785 Hyperlipidemia, unspecified: Secondary | ICD-10-CM | POA: Diagnosis not present

## 2015-05-14 DIAGNOSIS — I872 Venous insufficiency (chronic) (peripheral): Secondary | ICD-10-CM | POA: Diagnosis not present

## 2015-05-14 DIAGNOSIS — I10 Essential (primary) hypertension: Secondary | ICD-10-CM | POA: Diagnosis not present

## 2015-05-14 DIAGNOSIS — E119 Type 2 diabetes mellitus without complications: Secondary | ICD-10-CM | POA: Insufficient documentation

## 2015-05-14 DIAGNOSIS — I82511 Chronic embolism and thrombosis of right femoral vein: Secondary | ICD-10-CM | POA: Diagnosis not present

## 2015-05-14 DIAGNOSIS — I83899 Varicose veins of unspecified lower extremities with other complications: Secondary | ICD-10-CM | POA: Insufficient documentation

## 2015-05-14 NOTE — Progress Notes (Signed)
Referred by:  Daphene Jaeger, PA-C [place] [place], [place] [address]  Reason for referral: bilateral lymphedema   History of Present Illness  Glen Lee is a 81 y.o. ([birth year]) male who presents with chief complaint: right > left calf swelling.  Patient notes, onset of swelling 3 months ago, associated with no obvious trigger.  The patient's symptoms include: swelling with progression of day and aching in calves.  The patient has had no known history of DVT, known history of varicose vein, no history of venous stasis ulcers, no history of  Lymphedema and no history of skin changes in lower legs.  There is no family history of venous disorders.  The patient has used knee high compression stockings in the past.   Past Medical History  Diagnosis Date  . Diabetes mellitus without complication ([place])   . Hypertension   . Hyperlipidemia   . Enlarged prostate   . Edema     right leg  . Anemia   . Vitamin D deficiency   . Esophageal reflux   . Arthritis   . Tremor     right hand  . Thyroid nodule   . Atrial fibrillation [facility])     Past Surgical History  Procedure Laterality Date  . Knee surgery    . Joint replacement      Social History   Social History  . Marital Status: Single    Spouse Name: N/A  . Number of Children: N/A  . Years of Education: N/A   Occupational History  . Not on file.   Social History Main Topics  . Smoking status: Former Smoker    Types: Cigarettes    Quit date: 08/04/1992  . Smokeless tobacco: Not on file  . Alcohol Use: No  . Drug Use: Not on file  . Sexual Activity: Not on file   Other Topics Concern  . Not on file   Social History Narrative   Family History: patient unable to detail his parents' medical history  Current Outpatient Prescriptions  Medication Sig Dispense Refill  . diltiazem (CARDIZEM CD) 120 MG 24 hr capsule Take 1 capsule (120 mg total) by mouth daily. 30 capsule 0  . exenatide  (BYETTA) 5 MCG/0.02ML SOPN injection Inject 5 mcg into the skin daily as needed (bs > 130).    . finasteride (PROSCAR) 5 MG tablet Take 5 mg by mouth daily.    . pantoprazole (PROTONIX) 40 MG tablet Take 40 mg by mouth daily.    . simvastatin (ZOCOR) 40 MG tablet Take 40 mg by mouth daily.    . sitaGLIPtin (JANUVIA) 25 MG tablet Take 25 mg by mouth daily.    Marland Kitchen torsemide (DEMADEX) 10 MG tablet Take 10 mg by mouth daily.    . Vitamin D, Ergocalciferol, (DRISDOL) 50000 UNITS CAPS capsule Take 50,000 Units by mouth every 7 (seven) days.    . Cholecalciferol (VITAMIN D3) 3000 UNITS TABS Take 1 tablet by mouth daily.    Marland Kitchen doxycycline (VIBRA-TABS) 100 MG tablet Take 1 tablet (100 mg total) by mouth every 12 (twelve) hours. (Patient not taking: Reported on 05/14/2015) 10 tablet 0  . ferrous sulfate 325 (65 FE) MG tablet Take 325 mg by mouth daily with breakfast.    . Mesalamine 800 MG TBEC Take 800 mg by mouth 3 (three) times daily.    . Omega-3 Fatty Acids (OMEGA-3 FISH OIL) 300 MG CAPS Take 300 mg by mouth daily.    Marland Kitchen  oxymetazoline (AFRIN) 0.05 % nasal spray Place 1 spray into both nostrils 2 (two) times daily as needed for congestion.    Marland Kitchen POTASSIUM GLUCONATE PO Take 1 tablet by mouth daily.    Marland Kitchen sulfamethoxazole-trimethoprim (BACTRIM DS,SEPTRA DS) 800-160 MG tablet     . tamsulosin (FLOMAX) 0.4 MG CAPS capsule Take 0.4 mg by mouth daily.    Marland Kitchen torsemide (DEMADEX) 20 MG tablet     . triamcinolone cream (KENALOG) 0.1 %      No current facility-administered medications for this visit.    Allergies  Allergen Reactions  . Keflet [Cephalexin] Rash     REVIEW OF SYSTEMS:  (Positives checked otherwise negative)  CARDIOVASCULAR:   [ ]  chest pain,  [ ]  chest pressure,  [ ]  palpitations,  [ ]  shortness of breath when laying flat,  [ ]  shortness of breath with exertion,   [ ]  pain in feet when walking,  [ ]  pain in feet when laying flat, [ ]  history of blood clot in veins (DVT),  [ ]  history of  phlebitis,  [x]  swelling in legs,  [ ]  varicose veins  PULMONARY:   [ ]  productive cough,  [ ]  asthma,  [ ]  wheezing  NEUROLOGIC:   [ ]  weakness in arms or legs,  [ ]  numbness in arms or legs,  [ ]  difficulty speaking or slurred speech,  [ ]  temporary loss of vision in one eye,  [ ]  dizziness  HEMATOLOGIC:   [ ]  bleeding problems,  [ ]  problems with blood clotting too easily  MUSCULOSKEL:   [ ]  joint pain, [ ]  joint swelling  GASTROINTEST:   [ ]  vomiting blood,  [ ]  blood in stool     GENITOURINARY:   [ ]  burning with urination,  [ ]  blood in urine  PSYCHIATRIC:   [ ]  history of major depression  INTEGUMENTARY:   [ ]  rashes,  [ ]  ulcers  CONSTITUTIONAL:   [ ]  fever,  [ ]  chills   Physical Examination  Filed Vitals:   05/14/15 1138 05/14/15 1141  BP: 174/58 149/66  Pulse: 59   Height: 5' 10"  (1.778 m)   Weight: 207 lb 6.4 oz (94.076 kg)   SpO2: 99%    Body mass index is 29.76 kg/(m^2).  General: A&O x 3, WDWN  Head: [place]/AT  Ear/Nose/Throat: Hearing grossly intact, nares without erythema or drainage, oropharynx without Erythema/Exudate, Mallampati score: 3  Eyes: PERRLA, EOMI  Neck: Supple, no nuchal rigidity, no palpable LAD  Pulmonary: Sym exp, good air movt, CTAB, no rales, rhonchi, & wheezing  Cardiac: RRR, Nl S1, S2, no Murmurs, rubs or gallops  Vascular: Vessel Right Left  Radial Palpable Palpable  Brachial Palpable Palpable  Carotid Palpable, without bruit Palpable, without bruit  Aorta Not palpable N/A  Femoral Palpable Palpable  Popliteal Not palpable Not palpable  PT Not Palpable Not Palpable  DP Not Palpable Not Palpable   Gastrointestinal: soft, NTND, no G/R, no HSM, no masses, no CVAT B  Musculoskeletal: M/S 5/5 throughout , Extremities without ischemic changes , mild RLE LDS, RLE edema 1-2+, LLE 1+; no woody skin changes, skin can be pinched at level of MT  Neurologic: CN 2-12 intact , Pain and light touch intact in  extremities , Motor exam as listed above  Psychiatric: Judgment intact, Mood & affect appropriate for pt's clinical situation  Dermatologic: See M/S exam for extremity exam, no rashes otherwise noted  Lymph : No Cervical, Axillary, or Inguinal  lymphadenopathy   Non-Invasive Vascular Imaging  RLE Venous Insufficiency Duplex (Date: 05/14/2015):   RLE:   + DVT: chronic non-obstructive thrombus  No SVT,   + GSV reflux,   No SSV reflux,  + deep venous reflux: CFV, FV, pop V   Medical Decision Making  Glen Lee is a 81 y.o. male who presents with: BLE chronic venous insufficiency (C4), R>L varicose veins with complications   This patient has no evidence of lymphedema.  Mgmt of lymphedema is compression anyway, with only the intensity being different.  Based on the patient's history and examination, I recommend: compressive therapy.  I discussed with the patient the use of her 20-30 mm thigh high compression stockings and need for 3 month trial of such.  The patient will follow up in 3 months with my partners in the [facility] for evaluation for: repeat RLE venous reflux to see if GSV is big enough for R GSV EVLA  Thank you for allowing [place] to participate in this patient's care.   Adele Barthel, MD [facility] Office: [phone] Pager: [phone]  05/14/2015, 12:53 PM

## 2015-08-10 ENCOUNTER — Encounter: Payer: Self-pay | Admitting: Vascular Surgery

## 2015-08-17 ENCOUNTER — Encounter: Payer: Self-pay | Admitting: Vascular Surgery

## 2015-08-17 ENCOUNTER — Ambulatory Visit (INDEPENDENT_AMBULATORY_CARE_PROVIDER_SITE_OTHER): Payer: Medicare PPO | Admitting: Vascular Surgery

## 2015-08-17 VITALS — BP 170/72 | HR 59 | Temp 97.8°F | Resp 18 | Ht 70.0 in | Wt 220.0 lb

## 2015-08-17 DIAGNOSIS — R600 Localized edema: Secondary | ICD-10-CM | POA: Diagnosis not present

## 2015-08-17 NOTE — Progress Notes (Signed)
Subjective:     Patient ID: Glen Lee, male   DOB: 1933-06-15, 82 y.o.   MRN: [id number]  HPI This 82 year old male returns for further follow-up regarding the edema in his right lower extremity. He was seen by Dr. Bridgett Larsson 3 months ago. Patient has been wearing elastic compression stockings but has not been effectively elevating  his lower extremities. He has no history of DVT, thrombophlebitis, stasis ulcers, or bleeding.   Past Medical History  Diagnosis Date  . Diabetes mellitus without complication ([place])   . Hypertension   . Hyperlipidemia   . Enlarged prostate   . Edema     right leg  . Anemia   . Vitamin D deficiency   . Esophageal reflux   . Arthritis   . Tremor     right hand  . Thyroid nodule   . Atrial fibrillation [facility])     Social History  Substance Use Topics  . Smoking status: Former Smoker    Types: Cigarettes    Quit date: 08/04/1992  . Smokeless tobacco: Not on file  . Alcohol Use: No    History reviewed. No pertinent family history.  Allergies  Allergen Reactions  . Keflet [Cephalexin] Rash     Current outpatient prescriptions:  .  Cholecalciferol (VITAMIN D3) 3000 UNITS TABS, Take 1 tablet by mouth daily., Disp: , Rfl:  .  diltiazem (CARDIZEM CD) 120 MG 24 hr capsule, Take 1 capsule (120 mg total) by mouth daily., Disp: 30 capsule, Rfl: 0 .  doxycycline (VIBRA-TABS) 100 MG tablet, Take 1 tablet (100 mg total) by mouth every 12 (twelve) hours., Disp: 10 tablet, Rfl: 0 .  exenatide (BYETTA) 5 MCG/0.02ML SOPN injection, Inject 5 mcg into the skin daily as needed (bs > 130)., Disp: , Rfl:  .  ferrous sulfate 325 (65 FE) MG tablet, Take 325 mg by mouth daily with breakfast., Disp: , Rfl:  .  finasteride (PROSCAR) 5 MG tablet, Take 5 mg by mouth daily., Disp: , Rfl:  .  Mesalamine 800 MG TBEC, Take 800 mg by mouth 3 (three) times daily., Disp: , Rfl:  .  Omega-3 Fatty Acids (OMEGA-3 FISH OIL) 300 MG CAPS, Take 300 mg by mouth daily., Disp: , Rfl:  .   oxymetazoline (AFRIN) 0.05 % nasal spray, Place 1 spray into both nostrils 2 (two) times daily as needed for congestion., Disp: , Rfl:  .  pantoprazole (PROTONIX) 40 MG tablet, Take 40 mg by mouth daily., Disp: , Rfl:  .  POTASSIUM GLUCONATE PO, Take 1 tablet by mouth daily., Disp: , Rfl:  .  simvastatin (ZOCOR) 40 MG tablet, Take 40 mg by mouth daily., Disp: , Rfl:  .  sitaGLIPtin (JANUVIA) 25 MG tablet, Take 25 mg by mouth daily., Disp: , Rfl:  .  Vitamin D, Ergocalciferol, (DRISDOL) 50000 UNITS CAPS capsule, Take 50,000 Units by mouth every 7 (seven) days., Disp: , Rfl:  .  sulfamethoxazole-trimethoprim (BACTRIM DS,SEPTRA DS) 800-160 MG tablet, Reported on 08/17/2015, Disp: , Rfl:  .  tamsulosin (FLOMAX) 0.4 MG CAPS capsule, Take 0.4 mg by mouth daily. Reported on 08/17/2015, Disp: , Rfl:  .  torsemide (DEMADEX) 10 MG tablet, Take 10 mg by mouth daily. Reported on 08/17/2015, Disp: , Rfl:  .  torsemide (DEMADEX) 20 MG tablet, Reported on 08/17/2015, Disp: , Rfl:  .  triamcinolone cream (KENALOG) 0.1 %, Reported on 08/17/2015, Disp: , Rfl:   Filed Vitals:   08/17/15 0959 08/17/15 1000  BP: 179/72 170/72  Pulse:  59   Temp: 97.8 F (36.6 C)   Resp: 18   Height: 5' 10"  (1.778 m)   Weight: 220 lb (99.791 kg)   SpO2: 97%     Body mass index is 31.57 kg/(m^2).           Review of Systems Denies chest pain, dyspnea on exertion, claudication, hemoptysis.     Objective:   Physical Exam BP 170/72 mmHg  Pulse 59  Temp(Src) 97.8 F (36.6 C)  Resp 18  Ht 5' 10"  (1.778 m)  Wt 220 lb (99.791 kg)  BMI 31.57 kg/m2  SpO2 97%   Gen. Elderly male in no apparent distress alert and oriented 3 with an obvious tremor of his upper extremity Lungs no rhonchi or wheezing  right leg with 1-2+ edema from distal thigh to foot. No active ulcer noted. Right foot well perfused. 2+ dorsalis pedis pulse palpable. No varicosities noted.  Today I reviewed the previous duplex scan performed 05/14/2015 of the  right leg and also performed a bedside independent SonoSite exam. This revealed a small caliber right great saphenous vein with no reflux and no reflux in the right small saphenous vein.     Assessment:      chronic edema right leg with no evidence of significant superficial venous reflux. Deep venous reflux noted in superficial femoral and popliteal vein on the right   diabetes mellitus     Plan:      #1 elevate foot of bed 3 inches Number to apply elastic compression stockings before arising in the morning #3 no role for any intervention in this patient from a vascular standpoint Return to see male when necessary basis

## 2016-04-04 DIAGNOSIS — Z1389 Encounter for screening for other disorder: Secondary | ICD-10-CM | POA: Diagnosis not present

## 2016-04-04 DIAGNOSIS — Z6830 Body mass index (BMI) 30.0-30.9, adult: Secondary | ICD-10-CM | POA: Diagnosis not present

## 2016-04-04 DIAGNOSIS — E1121 Type 2 diabetes mellitus with diabetic nephropathy: Secondary | ICD-10-CM | POA: Diagnosis not present

## 2016-04-04 DIAGNOSIS — E669 Obesity, unspecified: Secondary | ICD-10-CM | POA: Diagnosis not present

## 2016-04-04 DIAGNOSIS — Z9181 History of falling: Secondary | ICD-10-CM | POA: Diagnosis not present

## 2016-04-04 DIAGNOSIS — E782 Mixed hyperlipidemia: Secondary | ICD-10-CM | POA: Diagnosis not present

## 2016-05-08 DIAGNOSIS — I8310 Varicose veins of unspecified lower extremity with inflammation: Secondary | ICD-10-CM | POA: Diagnosis not present

## 2016-06-01 DIAGNOSIS — I8310 Varicose veins of unspecified lower extremity with inflammation: Secondary | ICD-10-CM | POA: Diagnosis not present

## 2016-06-01 DIAGNOSIS — R600 Localized edema: Secondary | ICD-10-CM | POA: Diagnosis not present

## 2016-06-29 ENCOUNTER — Emergency Department (HOSPITAL_COMMUNITY): Payer: Medicare PPO

## 2016-06-29 ENCOUNTER — Encounter (HOSPITAL_COMMUNITY): Payer: Self-pay

## 2016-06-29 ENCOUNTER — Inpatient Hospital Stay (HOSPITAL_COMMUNITY)
Admission: EM | Admit: 2016-06-29 | Discharge: 2016-07-03 | DRG: 493 | Disposition: A | Discharge status: Skilled Nursing Facility | Payer: Medicare PPO | Attending: Internal Medicine | Admitting: Internal Medicine

## 2016-06-29 DIAGNOSIS — E1122 Type 2 diabetes mellitus with diabetic chronic kidney disease: Secondary | ICD-10-CM | POA: Diagnosis not present

## 2016-06-29 DIAGNOSIS — E785 Hyperlipidemia, unspecified: Secondary | ICD-10-CM

## 2016-06-29 DIAGNOSIS — R531 Weakness: Secondary | ICD-10-CM | POA: Diagnosis not present

## 2016-06-29 DIAGNOSIS — I482 Chronic atrial fibrillation: Secondary | ICD-10-CM | POA: Diagnosis not present

## 2016-06-29 DIAGNOSIS — E1140 Type 2 diabetes mellitus with diabetic neuropathy, unspecified: Secondary | ICD-10-CM | POA: Diagnosis present

## 2016-06-29 DIAGNOSIS — R262 Difficulty in walking, not elsewhere classified: Secondary | ICD-10-CM

## 2016-06-29 DIAGNOSIS — J449 Chronic obstructive pulmonary disease, unspecified: Secondary | ICD-10-CM | POA: Diagnosis not present

## 2016-06-29 DIAGNOSIS — J9811 Atelectasis: Secondary | ICD-10-CM | POA: Diagnosis not present

## 2016-06-29 DIAGNOSIS — I480 Paroxysmal atrial fibrillation: Secondary | ICD-10-CM | POA: Diagnosis present

## 2016-06-29 DIAGNOSIS — K219 Gastro-esophageal reflux disease without esophagitis: Secondary | ICD-10-CM | POA: Diagnosis present

## 2016-06-29 DIAGNOSIS — I129 Hypertensive chronic kidney disease with stage 1 through stage 4 chronic kidney disease, or unspecified chronic kidney disease: Secondary | ICD-10-CM | POA: Diagnosis present

## 2016-06-29 DIAGNOSIS — S0990XA Unspecified injury of head, initial encounter: Secondary | ICD-10-CM | POA: Diagnosis not present

## 2016-06-29 DIAGNOSIS — S8254XD Nondisplaced fracture of medial malleolus of right tibia, subsequent encounter for closed fracture with routine healing: Secondary | ICD-10-CM | POA: Diagnosis not present

## 2016-06-29 DIAGNOSIS — T148XXA Other injury of unspecified body region, initial encounter: Secondary | ICD-10-CM | POA: Diagnosis not present

## 2016-06-29 DIAGNOSIS — R0902 Hypoxemia: Secondary | ICD-10-CM | POA: Diagnosis present

## 2016-06-29 DIAGNOSIS — E559 Vitamin D deficiency, unspecified: Secondary | ICD-10-CM | POA: Diagnosis present

## 2016-06-29 DIAGNOSIS — N183 Chronic kidney disease, stage 3 (moderate): Secondary | ICD-10-CM | POA: Diagnosis present

## 2016-06-29 DIAGNOSIS — S8251XA Displaced fracture of medial malleolus of right tibia, initial encounter for closed fracture: Secondary | ICD-10-CM | POA: Diagnosis not present

## 2016-06-29 DIAGNOSIS — M25571 Pain in right ankle and joints of right foot: Secondary | ICD-10-CM | POA: Diagnosis not present

## 2016-06-29 DIAGNOSIS — N184 Chronic kidney disease, stage 4 (severe): Secondary | ICD-10-CM | POA: Diagnosis not present

## 2016-06-29 DIAGNOSIS — R269 Unspecified abnormalities of gait and mobility: Secondary | ICD-10-CM | POA: Diagnosis present

## 2016-06-29 DIAGNOSIS — Z881 Allergy status to other antibiotic agents status: Secondary | ICD-10-CM | POA: Diagnosis not present

## 2016-06-29 DIAGNOSIS — I4891 Unspecified atrial fibrillation: Secondary | ICD-10-CM | POA: Diagnosis not present

## 2016-06-29 DIAGNOSIS — N400 Enlarged prostate without lower urinary tract symptoms: Secondary | ICD-10-CM | POA: Diagnosis present

## 2016-06-29 DIAGNOSIS — W000XXA Fall on same level due to ice and snow, initial encounter: Secondary | ICD-10-CM | POA: Diagnosis present

## 2016-06-29 DIAGNOSIS — J9589 Other postprocedural complications and disorders of respiratory system, not elsewhere classified: Secondary | ICD-10-CM | POA: Diagnosis not present

## 2016-06-29 DIAGNOSIS — S82899A Other fracture of unspecified lower leg, initial encounter for closed fracture: Secondary | ICD-10-CM | POA: Diagnosis not present

## 2016-06-29 DIAGNOSIS — R251 Tremor, unspecified: Secondary | ICD-10-CM | POA: Diagnosis present

## 2016-06-29 DIAGNOSIS — Z4789 Encounter for other orthopedic aftercare: Secondary | ICD-10-CM | POA: Diagnosis not present

## 2016-06-29 DIAGNOSIS — S82851A Displaced trimalleolar fracture of right lower leg, initial encounter for closed fracture: Principal | ICD-10-CM | POA: Diagnosis present

## 2016-06-29 DIAGNOSIS — Z419 Encounter for procedure for purposes other than remedying health state, unspecified: Secondary | ICD-10-CM

## 2016-06-29 DIAGNOSIS — R296 Repeated falls: Secondary | ICD-10-CM | POA: Diagnosis present

## 2016-06-29 DIAGNOSIS — Z87891 Personal history of nicotine dependence: Secondary | ICD-10-CM | POA: Diagnosis not present

## 2016-06-29 DIAGNOSIS — S92909A Unspecified fracture of unspecified foot, initial encounter for closed fracture: Secondary | ICD-10-CM | POA: Diagnosis not present

## 2016-06-29 DIAGNOSIS — E119 Type 2 diabetes mellitus without complications: Secondary | ICD-10-CM | POA: Diagnosis not present

## 2016-06-29 DIAGNOSIS — Z79899 Other long term (current) drug therapy: Secondary | ICD-10-CM | POA: Diagnosis not present

## 2016-06-29 DIAGNOSIS — I10 Essential (primary) hypertension: Secondary | ICD-10-CM | POA: Diagnosis not present

## 2016-06-29 DIAGNOSIS — M6281 Muscle weakness (generalized): Secondary | ICD-10-CM | POA: Diagnosis not present

## 2016-06-29 DIAGNOSIS — S93311A Subluxation of tarsal joint of right foot, initial encounter: Secondary | ICD-10-CM | POA: Diagnosis not present

## 2016-06-29 DIAGNOSIS — S82401D Unspecified fracture of shaft of right fibula, subsequent encounter for closed fracture with routine healing: Secondary | ICD-10-CM | POA: Diagnosis not present

## 2016-06-29 DIAGNOSIS — T148XXD Other injury of unspecified body region, subsequent encounter: Secondary | ICD-10-CM | POA: Diagnosis not present

## 2016-06-29 DIAGNOSIS — S82891A Other fracture of right lower leg, initial encounter for closed fracture: Secondary | ICD-10-CM | POA: Diagnosis not present

## 2016-06-29 DIAGNOSIS — D649 Anemia, unspecified: Secondary | ICD-10-CM | POA: Diagnosis not present

## 2016-06-29 LAB — I-STAT CHEM 8, ED
BUN: 25 mg/dL — ABNORMAL HIGH (ref 6–20)
CALCIUM ION: 1.23 mmol/L (ref 1.15–1.40)
Chloride: 105 mmol/L (ref 101–111)
Creatinine, Ser: 1.6 mg/dL — ABNORMAL HIGH (ref 0.61–1.24)
Glucose, Bld: 155 mg/dL — ABNORMAL HIGH (ref 65–99)
HCT: 30 % — ABNORMAL LOW (ref 39.0–52.0)
HEMOGLOBIN: 10.2 g/dL — AB (ref 13.0–17.0)
Potassium: 4.2 mmol/L (ref 3.5–5.1)
Sodium: 142 mmol/L (ref 135–145)
TCO2: 24 mmol/L (ref 0–100)

## 2016-06-29 LAB — CBC WITH DIFFERENTIAL/PLATELET
BASOS ABS: 0 10*3/uL (ref 0.0–0.1)
BASOS PCT: 0 %
EOS ABS: 0 10*3/uL (ref 0.0–0.7)
Eosinophils Relative: 0 %
HCT: 32.5 % — ABNORMAL LOW (ref 39.0–52.0)
Hemoglobin: 10.8 g/dL — ABNORMAL LOW (ref 13.0–17.0)
LYMPHS ABS: 0.8 10*3/uL (ref 0.7–4.0)
Lymphocytes Relative: 10 %
MCH: 28.8 pg (ref 26.0–34.0)
MCHC: 33.2 g/dL (ref 30.0–36.0)
MCV: 86.7 fL (ref 78.0–100.0)
Monocytes Absolute: 0.5 10*3/uL (ref 0.1–1.0)
Monocytes Relative: 7 %
NEUTROS ABS: 6.1 10*3/uL (ref 1.7–7.7)
NEUTROS PCT: 83 %
PLATELETS: 139 10*3/uL — AB (ref 150–400)
RBC: 3.75 MIL/uL — ABNORMAL LOW (ref 4.22–5.81)
RDW: 14.3 % (ref 11.5–15.5)
WBC: 7.5 10*3/uL (ref 4.0–10.5)

## 2016-06-29 LAB — CREATININE, SERUM
CREATININE: 1.52 mg/dL — AB (ref 0.61–1.24)
GFR calc Af Amer: 48 mL/min — ABNORMAL LOW (ref >60)
GFR calc non Af Amer: 42 mL/min — ABNORMAL LOW (ref >60)

## 2016-06-29 LAB — GLUCOSE, CAPILLARY: Glucose-Capillary: 113 mg/dL — ABNORMAL HIGH (ref 65–99)

## 2016-06-29 LAB — CBC
HCT: 30.6 % — ABNORMAL LOW (ref 39.0–52.0)
Hemoglobin: 10 g/dL — ABNORMAL LOW (ref 13.0–17.0)
MCH: 28.5 pg (ref 26.0–34.0)
MCHC: 32.7 g/dL (ref 30.0–36.0)
MCV: 87.2 fL (ref 78.0–100.0)
PLATELETS: 130 10*3/uL — AB (ref 150–400)
RBC: 3.51 MIL/uL — ABNORMAL LOW (ref 4.22–5.81)
RDW: 14.6 % (ref 11.5–15.5)
WBC: 7.5 10*3/uL (ref 4.0–10.5)

## 2016-06-29 MED ORDER — OMEGA-3 FISH OIL 300 MG PO CAPS: 300.0000 mg | ORAL_CAPSULE | Freq: Every day | ORAL | Status: DC

## 2016-06-29 MED ORDER — INSULIN ASPART 100 UNIT/ML ~~LOC~~ SOLN
0.0000 [IU] | Freq: Three times a day (TID) | SUBCUTANEOUS | Status: DC
  Administered 2016-07-01: 1 [IU] via SUBCUTANEOUS
  Administered 2016-07-01: 2 [IU] via SUBCUTANEOUS
  Administered 2016-07-01 – 2016-07-03 (×4): 1 [IU] via SUBCUTANEOUS

## 2016-06-29 MED ORDER — POVIDONE-IODINE 10 % EX SWAB: 2.0000 "application " | Freq: Once | CUTANEOUS | Status: DC

## 2016-06-29 MED ORDER — SODIUM CHLORIDE 0.9% FLUSH: 3.0000 mL | INTRAVENOUS | Status: DC | PRN

## 2016-06-29 MED ORDER — ENOXAPARIN SODIUM 40 MG/0.4ML ~~LOC~~ SOLN
40.0000 mg | SUBCUTANEOUS | Status: DC
  Administered 2016-06-29 – 2016-07-01 (×3): 40 mg via SUBCUTANEOUS
  Filled 2016-06-29 (×3): qty 0.4

## 2016-06-29 MED ORDER — TAMSULOSIN HCL 0.4 MG PO CAPS
0.4000 mg | ORAL_CAPSULE | Freq: Every day | ORAL | Status: DC
  Administered 2016-07-01 – 2016-07-03 (×3): 0.4 mg via ORAL
  Filled 2016-06-29 (×4): qty 1

## 2016-06-29 MED ORDER — DILTIAZEM HCL ER COATED BEADS 120 MG PO CP24
120.0000 mg | ORAL_CAPSULE | Freq: Every day | ORAL | Status: DC
  Administered 2016-07-01 – 2016-07-03 (×3): 120 mg via ORAL
  Filled 2016-06-29 (×4): qty 1

## 2016-06-29 MED ORDER — ACETAMINOPHEN 325 MG PO TABS
650.0000 mg | ORAL_TABLET | Freq: Four times a day (QID) | ORAL | Status: DC | PRN
  Administered 2016-06-29 – 2016-07-03 (×6): 650 mg via ORAL
  Filled 2016-06-29 (×7): qty 2

## 2016-06-29 MED ORDER — SODIUM CHLORIDE 0.9 % IV SOLN: 250.0000 mL | INTRAVENOUS | Status: DC | PRN

## 2016-06-29 MED ORDER — ONDANSETRON HCL 4 MG/2ML IJ SOLN: 4.0000 mg | Freq: Four times a day (QID) | INTRAMUSCULAR | Status: DC | PRN

## 2016-06-29 MED ORDER — FENTANYL CITRATE (PF) 100 MCG/2ML IJ SOLN
50.0000 ug | INTRAMUSCULAR | Status: AC | PRN
  Administered 2016-06-29 (×2): 50 ug via NASAL
  Filled 2016-06-29 (×2): qty 2

## 2016-06-29 MED ORDER — MORPHINE SULFATE (PF) 2 MG/ML IV SOLN
1.0000 mg | INTRAVENOUS | Status: DC | PRN
  Administered 2016-06-29 – 2016-06-30 (×4): 1 mg via INTRAVENOUS
  Filled 2016-06-29 (×4): qty 1

## 2016-06-29 MED ORDER — VITAMIN D 1000 UNITS PO TABS
3000.0000 [IU] | ORAL_TABLET | Freq: Every day | ORAL | Status: DC
  Administered 2016-07-01 – 2016-07-03 (×3): 3000 [IU] via ORAL
  Filled 2016-06-29 (×4): qty 3

## 2016-06-29 MED ORDER — SODIUM CHLORIDE 0.9% FLUSH
3.0000 mL | Freq: Two times a day (BID) | INTRAVENOUS | Status: DC
  Administered 2016-06-29 – 2016-07-02 (×5): 3 mL via INTRAVENOUS

## 2016-06-29 MED ORDER — VITAMIN D (ERGOCALCIFEROL) 1.25 MG (50000 UNIT) PO CAPS: 50000.0000 [IU] | ORAL_CAPSULE | ORAL | Status: DC

## 2016-06-29 MED ORDER — CHLORHEXIDINE GLUCONATE 4 % EX LIQD
60.0000 mL | Freq: Once | CUTANEOUS | Status: AC
  Administered 2016-06-29: 4 via TOPICAL
  Filled 2016-06-29: qty 60

## 2016-06-29 MED ORDER — SIMVASTATIN 40 MG PO TABS
40.0000 mg | ORAL_TABLET | Freq: Every day | ORAL | Status: DC
  Administered 2016-06-29 – 2016-07-02 (×4): 40 mg via ORAL
  Filled 2016-06-29 (×6): qty 1

## 2016-06-29 MED ORDER — FINASTERIDE 5 MG PO TABS
5.0000 mg | ORAL_TABLET | Freq: Every day | ORAL | Status: DC
  Administered 2016-07-01 – 2016-07-03 (×3): 5 mg via ORAL
  Filled 2016-06-29 (×4): qty 1

## 2016-06-29 MED ORDER — MESALAMINE 400 MG PO CPDR
800.0000 mg | DELAYED_RELEASE_CAPSULE | Freq: Three times a day (TID) | ORAL | Status: DC
  Administered 2016-06-29 – 2016-07-03 (×8): 800 mg via ORAL
  Filled 2016-06-29 (×13): qty 2

## 2016-06-29 MED ORDER — ACETAMINOPHEN 650 MG RE SUPP: 650.0000 mg | Freq: Four times a day (QID) | RECTAL | Status: DC | PRN

## 2016-06-29 MED ORDER — CLINDAMYCIN PHOSPHATE 900 MG/50ML IV SOLN
900.0000 mg | INTRAVENOUS | Status: AC
  Administered 2016-06-30: 900 mg via INTRAVENOUS
  Filled 2016-06-29: qty 50

## 2016-06-29 MED ORDER — ONDANSETRON HCL 4 MG PO TABS
4.0000 mg | ORAL_TABLET | Freq: Four times a day (QID) | ORAL | Status: DC | PRN
Start: 2016-06-29 — End: 2016-07-03

## 2016-06-29 MED ORDER — FERROUS SULFATE 325 (65 FE) MG PO TABS
325.0000 mg | ORAL_TABLET | Freq: Every day | ORAL | Status: DC
  Administered 2016-07-01 – 2016-07-03 (×3): 325 mg via ORAL
  Filled 2016-06-29 (×3): qty 1

## 2016-06-29 MED ORDER — PANTOPRAZOLE SODIUM 40 MG PO TBEC
40.0000 mg | DELAYED_RELEASE_TABLET | Freq: Every day | ORAL | Status: DC
  Administered 2016-07-01 – 2016-07-03 (×3): 40 mg via ORAL
  Filled 2016-06-29 (×4): qty 1

## 2016-06-29 NOTE — ED Provider Notes (Addendum)
[facility] DEPT Provider Note   CSN: [id number] Arrival date & time: 06/29/16  1248     History   Chief Complaint Chief Complaint  Patient presents with  . Fall  . Ankle Injury    HPI Glen Lee is a 83 y.o. male.  HPI  Pt comes in with cc of ankle pain. Pt had a fall in his garage as he got ready to shovel snow. PT has R ankle pain from that. He denies pain elsewhere. Pt is not on blood thinner.  Past medical history of hypertension and diabetes and falls.  Past Medical History:  Diagnosis Date  . Anemia   . Arthritis   . Atrial fibrillation ([place])   . Diabetes mellitus without complication ([place])   . Edema    right leg  . Enlarged prostate   . Esophageal reflux   . Hyperlipidemia   . Hypertension   . Thyroid nodule   . Tremor    right hand  . Vitamin D deficiency     Patient Active Problem List   Diagnosis Date Noted  . Leg edema, right 08/17/2015  . Varicose veins of lower extremities with complications 51/07/5850  . Chronic venous insufficiency 05/14/2015  . PAF (paroxysmal atrial fibrillation) ([place]) 09/16/2014  . Sepsis ([place]) 08/07/2014  . Persistent atrial fibrillation ([place])   . Acute bronchitis 08/04/2014  . Diabetes mellitus ([place]) 08/04/2014  . Hypotension 08/04/2014  . CKD (chronic kidney disease) stage 4, GFR 15-29 ml/min ([facility]) 08/04/2014    Past Surgical History:  Procedure Laterality Date  . JOINT REPLACEMENT Left    knee  . KNEE SURGERY         Home Medications    Prior to Admission medications   Medication Sig Start Date End Date Taking? Authorizing Provider  Cholecalciferol (VITAMIN D3) 3000 UNITS TABS Take 1 tablet by mouth daily.    Historical Provider, MD  diltiazem (CARDIZEM CD) 120 MG 24 hr capsule Take 1 capsule (120 mg total) by mouth daily. 08/07/14   Debbe Odea, MD  doxycycline (VIBRA-TABS) 100 MG tablet Take 1 tablet (100 mg total) by mouth every 12 (twelve) hours. 08/07/14   Debbe Odea, MD  exenatide (BYETTA) 5  MCG/0.02ML SOPN injection Inject 5 mcg into the skin daily as needed (bs > 130).    Historical Provider, MD  ferrous sulfate 325 (65 FE) MG tablet Take 325 mg by mouth daily with breakfast.    Historical Provider, MD  finasteride (PROSCAR) 5 MG tablet Take 5 mg by mouth daily.    Historical Provider, MD  Mesalamine 800 MG TBEC Take 800 mg by mouth 3 (three) times daily.    Historical Provider, MD  Omega-3 Fatty Acids (OMEGA-3 FISH OIL) 300 MG CAPS Take 300 mg by mouth daily.    Historical Provider, MD  oxymetazoline (AFRIN) 0.05 % nasal spray Place 1 spray into both nostrils 2 (two) times daily as needed for congestion.    Historical Provider, MD  pantoprazole (PROTONIX) 40 MG tablet Take 40 mg by mouth daily.    Historical Provider, MD  POTASSIUM GLUCONATE PO Take 1 tablet by mouth daily.    Historical Provider, MD  simvastatin (ZOCOR) 40 MG tablet Take 40 mg by mouth daily.    Historical Provider, MD  sitaGLIPtin (JANUVIA) 25 MG tablet Take 25 mg by mouth daily.    Historical Provider, MD  sulfamethoxazole-trimethoprim (BACTRIM DS,SEPTRA DS) 800-160 MG tablet Reported on 08/17/2015 04/07/15   Historical Provider, MD  tamsulosin (FLOMAX) 0.4  MG CAPS capsule Take 0.4 mg by mouth daily. Reported on 08/17/2015    Historical Provider, MD  torsemide (DEMADEX) 10 MG tablet Take 10 mg by mouth daily. Reported on 08/17/2015    Historical Provider, MD  torsemide (DEMADEX) 20 MG tablet Reported on 08/17/2015 04/07/15   Historical Provider, MD  triamcinolone cream (KENALOG) 0.1 % Reported on 08/17/2015 04/28/15   Historical Provider, MD  Vitamin D, Ergocalciferol, (DRISDOL) 50000 UNITS CAPS capsule Take 50,000 Units by mouth every 7 (seven) days.    Historical Provider, MD    Family History History reviewed. No pertinent family history.  Social History Social History  Substance Use Topics  . Smoking status: Former Smoker    Types: Cigarettes    Quit date: 08/04/1992  . Smokeless tobacco: Never Used  . Alcohol  use No     Allergies   Keflet [cephalexin]   Review of Systems Review of Systems  Constitutional: Positive for activity change.  Respiratory: Negative for shortness of breath.   Cardiovascular: Negative for chest pain.  Musculoskeletal: Positive for arthralgias.  Skin: Negative for wound.  Neurological: Negative for headaches.  Hematological: Does not bruise/bleed easily.     Physical Exam Updated Vital Signs BP 175/64 (BP Location: Left Arm)   Pulse 66   Temp 97.6 F (36.4 C) (Oral)   Resp 16   Ht 6' (1.829 m)   Wt 215 lb (97.5 kg)   SpO2 99%   BMI 29.16 kg/m   Physical Exam  Constitutional: He is oriented to person, place, and time. He appears well-developed.  HENT:  Head: Atraumatic.  Neck: Neck supple.  Cardiovascular: Normal rate.   Pulmonary/Chest: Effort normal.  Abdominal: Soft.  Musculoskeletal: He exhibits edema and tenderness. He exhibits no deformity.  Head to toe evaluation shows no hematoma, bleeding of the scalp, no facial abrasions, no spine step offs, crepitus of the chest or neck, no tenderness to palpation of the bilateral upper and lower extremities, no gross deformities, no chest tenderness, no pelvic pain.  Neurological: He is alert and oriented to person, place, and time.  Skin: Skin is warm.  Nursing note and vitals reviewed.    ED Treatments / Results  Labs (all labs ordered are listed, but only abnormal results are displayed) Labs Reviewed  CBC WITH DIFFERENTIAL/PLATELET  I-STAT CHEM 8, ED    EKG  EKG Interpretation None       Radiology Dg Ankle Complete Right  Result Date: 06/29/2016 CLINICAL DATA:  Right ankle pain after slip and fall.  Swelling. EXAM: RIGHT ANKLE - COMPLETE 3+ VIEW COMPARISON:  None. FINDINGS: Weber B fracture pattern of the ankle with oblique lateral malleolar fracture, transverse medial malleolar fracture, and questionable posterior malleolar fracture. The tibial shaft is subluxed about 10 mm medially  with respect to the talus. Surrounding soft tissue swelling noted. Plantar calcaneal spur.  Vascular calcifications noted. IMPRESSION: 1. Weber B fracture pattern with fractures of the lateral and medial malleolus and probable fracture of the posterior malleolus given the medial subluxation of the tibia with respect to the talus. Unstable fracture. Electronically Signed   By: Van Clines M.D.   On: 06/29/2016 13:57   Ct Head Wo Contrast  Result Date: 06/29/2016 CLINICAL DATA:  Patient slipped and fell this afternoon. EXAM: CT HEAD WITHOUT CONTRAST TECHNIQUE: Contiguous axial images were obtained from the base of the skull through the vertex without intravenous contrast. COMPARISON:  None. FINDINGS: Brain: Mild superficial and central atrophy with chronic appearing small  vessel ischemic disease of periventricular white matter. No acute intracranial hemorrhage, mass or midline shift. Basal cisterns are midline without effacement. Vascular: Moderate calcification of the carotid siphons bilaterally. No hyperdense vasculature. Skull: Nonacute. Sinuses/Orbits: Mucous retention cyst partially imaged in the right maxillary sinus. Mastoids are clear. Orbits are intact with bilateral lens replacement surgical change. Other: None IMPRESSION: No acute intracranial abnormality. Chronic appearing small vessel ischemic disease of periventricular white matter with cerebral atrophy. Electronically Signed   By: Ashley Royalty M.D.   On: 06/29/2016 14:05    Procedures Procedures (including critical care time)  Medications Ordered in ED Medications  fentaNYL (SUBLIMAZE) injection 50 mcg (50 mcg Nasal Given 06/29/16 1321)     Initial Impression / Assessment and Plan / ED Course  I have reviewed the triage vital signs and the nursing notes.  Pertinent labs & imaging results that were available during my care of the patient were reviewed by me and considered in my medical decision making (see chart for  details).  Clinical Course as of Jun 29 1448  Thu Jun 29, 2016  1448 Pt has trimal fracture. Spoke with Dr. Mardelle Matte. Pt will need to be non weight bearing. If pt is not safe to go home, Dr. Mardelle Matte will be happy to operate on the patient tomorrow.  Spoke with pt and family. With pt having tremors, shoulder issues and balance issues - they are not comfortable at all going home right now, especially if surgical care is needed. Will admit. Transfer to [facility].  [AN]    Clinical Course User Index [AN] Varney Biles, MD    Pt comes in with cc of fall. DDx includes: - Mechanical falls - ICH - Fractures - Contusions - Soft tissue injury  Pt comes in with fall. CT head ordered. Ankle has a fracture - bimalleolar for sure. Pt is not a good candidate for crutches.  2:50 PM Xrays show trimalleolar fx. Will call orthopedics.  Final Clinical Impressions(s) / ED Diagnoses   Final diagnoses:  Closed trimalleolar fracture of right ankle, initial encounter    New Prescriptions New Prescriptions   No medications on file     Varney Biles, MD 06/29/16 [place], MD 06/29/16 [place], MD 06/29/16 1547

## 2016-06-29 NOTE — Progress Notes (Signed)
Had an in-depth discussion with the patient and family over the phone regarding his right ankle fracture.   They have elected surgical intervention, which is scheduled for first thing tomorrow morning at [facility].    Soft tissue swelling has been minimal according to the family, and he was wearing a compression hose when this happened, and he is now splinted and elevated.  Discussed the potential for surgical delay of a week or more if significant soft tissue compromise.    The risks benefits and alternatives were discussed with the patient including but not limited to the risks of nonoperative treatment, versus surgical intervention including infection, bleeding, nerve injury, malunion, nonunion, the need for revision surgery, hardware prominence, hardware failure, the need for hardware removal, blood clots, cardiopulmonary complications, morbidity, mortality, among others, and they were willing to proceed.    It will either be myself or Dr. Marcelino Scot, and I have communicated this to the family and patient as well.   Full consult to follow.   NPO after midnight.    [place] Bridge, MD

## 2016-06-29 NOTE — ED Triage Notes (Signed)
Per [facility], Pt, from home, c/o R ankle pain following a slip and fall this afternoon. Pain score 10/10.  Swelling noted.  However, Pt reports R ankle is typically larger than Left.  Also, Pt wears compression hose d/t leg swelling.

## 2016-06-29 NOTE — ED Notes (Signed)
[facility] en route to pick pt up.

## 2016-06-29 NOTE — Progress Notes (Signed)
Discussed case with Dr. Kathrynn Humble, patient is 31 with an ankle fracture.  Also has tremors and difficulty at baseline with ambulation, and may require admission for surgical intervention.    If so, we will plan for ORIF of ankle tomorrow, and full consult to follow.  If safe ED disposition is possible, then he could followup with me on Monday for outpatient surgical scheduling.    Johnny Bridge, MD

## 2016-06-29 NOTE — H&P (Signed)
History and Physical    Glen Lee VCB:[id number] DOB: Jul 03, 1933 DOA: 06/29/2016  PCP: Daphene Jaeger, PA-C   Patient coming from: Home  Chief Complaint: Right ankle pain.   HPI: Glen Lee is a 83 y.o. male with medical history significant of ambulatory dysfunction, who presents to the hospital after staying mechanical fall. This morning when he was walking through his dry weight which was covered with ice he fell sustaining injury to his right ankle. After the fall he developed severe pain of the right ankle, sharp in nature, 10 out of 10 intensity, worse with movement, no improving factors, he was unable to stand back on his feet. He was brought into the hospital for further evaluation.  He normally uses a walker for ambulation. He does have neuropathy, with tremors on his right upper extremity.  ED Course: Noted complex fracture to his right ankle, unsafe for home discharge due to increased fall risk. Consulted orthopedics for further intervention.  Review of Systems: 1. General. No chills, no fevers 2. ENT no runny nose or sore 3. Pulmonary pressures of breath cough or hemoptysis 4. Cardiovascular no angina, claudication up in the 5. Intestinal no nausea vomiting or diarrhea 6. Dermatology no rash 7. Urology no dysuria or increased frequency 8. Hematology no easy bruisability or frequent infections 9. Neurology no seizures or paresthesias 10. Psych no depression or anxiety  Past Medical History:  Diagnosis Date  . Anemia   . Arthritis   . Atrial fibrillation ([place])   . Diabetes mellitus without complication ([place])   . Edema    right leg  . Enlarged prostate   . Esophageal reflux   . Hyperlipidemia   . Hypertension   . Thyroid nodule   . Tremor    right hand  . Vitamin D deficiency     Past Surgical History:  Procedure Laterality Date  . JOINT REPLACEMENT Left    knee  . KNEE SURGERY       reports that he quit smoking about 23 years ago. His smoking use  included Cigarettes. He has never used smokeless tobacco. He reports that he does not drink alcohol. His drug history is not on file.  Allergies  Allergen Reactions  . Keflet [Cephalexin] Rash    History reviewed. No pertinent family history. Unacceptable: Noncontributory, unremarkable, or negative. Acceptable: Family history reviewed and not pertinent (If you reviewed it)  Prior to Admission medications   Medication Sig Start Date End Date Taking? Authorizing Provider  Cholecalciferol (VITAMIN D3) 3000 UNITS TABS Take 1 tablet by mouth daily.    Historical Provider, MD  diltiazem (CARDIZEM CD) 120 MG 24 hr capsule Take 1 capsule (120 mg total) by mouth daily. 08/07/14   Debbe Odea, MD  doxycycline (VIBRA-TABS) 100 MG tablet Take 1 tablet (100 mg total) by mouth every 12 (twelve) hours. 08/07/14   Debbe Odea, MD  exenatide (BYETTA) 5 MCG/0.02ML SOPN injection Inject 5 mcg into the skin daily as needed (bs > 130).    Historical Provider, MD  ferrous sulfate 325 (65 FE) MG tablet Take 325 mg by mouth daily with breakfast.    Historical Provider, MD  finasteride (PROSCAR) 5 MG tablet Take 5 mg by mouth daily.    Historical Provider, MD  Mesalamine 800 MG TBEC Take 800 mg by mouth 3 (three) times daily.    Historical Provider, MD  Omega-3 Fatty Acids (OMEGA-3 FISH OIL) 300 MG CAPS Take 300 mg by mouth daily.    Historical Provider, MD  oxymetazoline (AFRIN) 0.05 % nasal spray Place 1 spray into both nostrils 2 (two) times daily as needed for congestion.    Historical Provider, MD  pantoprazole (PROTONIX) 40 MG tablet Take 40 mg by mouth daily.    Historical Provider, MD  POTASSIUM GLUCONATE PO Take 1 tablet by mouth daily.    Historical Provider, MD  simvastatin (ZOCOR) 40 MG tablet Take 40 mg by mouth daily.    Historical Provider, MD  sitaGLIPtin (JANUVIA) 25 MG tablet Take 25 mg by mouth daily.    Historical Provider, MD  sulfamethoxazole-trimethoprim (BACTRIM DS,SEPTRA DS) 800-160 MG  tablet Reported on 08/17/2015 04/07/15   Historical Provider, MD  tamsulosin (FLOMAX) 0.4 MG CAPS capsule Take 0.4 mg by mouth daily. Reported on 08/17/2015    Historical Provider, MD  torsemide (DEMADEX) 10 MG tablet Take 10 mg by mouth daily. Reported on 08/17/2015    Historical Provider, MD  torsemide (DEMADEX) 20 MG tablet Reported on 08/17/2015 04/07/15   Historical Provider, MD  triamcinolone cream (KENALOG) 0.1 % Reported on 08/17/2015 04/28/15   Historical Provider, MD  Vitamin D, Ergocalciferol, (DRISDOL) 50000 UNITS CAPS capsule Take 50,000 Units by mouth every 7 (seven) days.    Historical Provider, MD    Physical Exam: Vitals:   06/29/16 1258 06/29/16 1300 06/29/16 1436  BP:  173/88 175/64  Pulse:  84 66  Resp:  18 16  Temp:  97.6 F (36.4 C)   TempSrc:  Oral   SpO2: 96% 96% 99%  Weight:  97.5 kg (215 lb)   Height:  6' (1.829 m)       Constitutional: not in pain or dyspnea Vitals:   06/29/16 1258 06/29/16 1300 06/29/16 1436  BP:  173/88 175/64  Pulse:  84 66  Resp:  18 16  Temp:  97.6 F (36.4 C)   TempSrc:  Oral   SpO2: 96% 96% 99%  Weight:  97.5 kg (215 lb)   Height:  6' (1.829 m)    Eyes: PERRL, lids and conjunctivae normal Head normocephalic, nose and ears no deformities.  ENMT: Mucous membranes are moist. Posterior pharynx clear of any exudate or lesions.Normal dentition.  Neck: normal, supple, no masses, no thyromegaly Respiratory: clear to auscultation bilaterally, no wheezing, no crackles. Normal respiratory effort. No accessory muscle use.  Cardiovascular: Regular rate and rhythm, no murmurs / rubs / gallops. No extremity edema. 2+ pedal pulses. No carotid bruits.  Abdomen: no tenderness, no masses palpated. No hepatosplenomegaly. Bowel sounds positive.  Musculoskeletal: no clubbing / cyanosis. No joint deformity upper and lower extremities. Good ROM, no contractures. Normal muscle tone. Cast to right leg and ankle.  Skin: no rashes, lesions, ulcers. No  induration Neurologic: CN 2-12 grossly intact. Sensation intact, DTR normal. Strength 5/5 in all 4. Resting tremor on the right upper extremity.    Labs on Admission: I have personally reviewed following labs and imaging studies  CBC:  Recent Labs Lab 06/29/16 1451 06/29/16 1504  WBC 7.5  --   NEUTROABS 6.1  --   HGB 10.8* 10.2*  HCT 32.5* 30.0*  MCV 86.7  --   PLT 139*  --    Basic Metabolic Panel:  Recent Labs Lab 06/29/16 1504  NA 142  K 4.2  CL 105  GLUCOSE 155*  BUN 25*  CREATININE 1.60*   GFR: Estimated Creatinine Clearance: 44.6 mL/min (by C-G formula based on SCr of 1.6 mg/dL (H)). Liver Function Tests: No results for input(s): AST, ALT, ALKPHOS, BILITOT, PROT,  ALBUMIN in the last 168 hours. No results for input(s): LIPASE, AMYLASE in the last 168 hours. No results for input(s): AMMONIA in the last 168 hours. Coagulation Profile: No results for input(s): INR, PROTIME in the last 168 hours. Cardiac Enzymes: No results for input(s): CKTOTAL, CKMB, CKMBINDEX, TROPONINI in the last 168 hours. BNP (last 3 results) No results for input(s): PROBNP in the last 8760 hours. HbA1C: No results for input(s): HGBA1C in the last 72 hours. CBG: No results for input(s): GLUCAP in the last 168 hours. Lipid Profile: No results for input(s): CHOL, HDL, LDLCALC, TRIG, CHOLHDL, LDLDIRECT in the last 72 hours. Thyroid Function Tests: No results for input(s): TSH, T4TOTAL, FREET4, T3FREE, THYROIDAB in the last 72 hours. Anemia Panel: No results for input(s): VITAMINB12, FOLATE, FERRITIN, TIBC, IRON, RETICCTPCT in the last 72 hours. Urine analysis:    Component Value Date/Time   COLORURINE YELLOW 08/05/2014 1419   APPEARANCEUR CLOUDY (A) 08/05/2014 1419   LABSPEC 1.023 08/05/2014 1419   PHURINE 5.0 08/05/2014 1419   GLUCOSEU NEGATIVE 08/05/2014 1419   HGBUR NEGATIVE 08/05/2014 1419   BILIRUBINUR NEGATIVE 08/05/2014 1419   KETONESUR NEGATIVE 08/05/2014 1419   PROTEINUR  30 (A) 08/05/2014 1419   UROBILINOGEN 0.2 08/05/2014 1419   NITRITE NEGATIVE 08/05/2014 1419   LEUKOCYTESUR NEGATIVE 08/05/2014 1419   Sepsis Labs: !!!!!!!!!!!!!!!!!!!!!!!!!!!!!!!!!!!!!!!!!!!! @LABRCNTIP (procalcitonin:4,lacticidven:4) )No results found for this or any previous visit (from the past 240 hour(s)).   Radiological Exams on Admission: Dg Ankle Complete Right  Result Date: 06/29/2016 CLINICAL DATA:  Right ankle pain after slip and fall.  Swelling. EXAM: RIGHT ANKLE - COMPLETE 3+ VIEW COMPARISON:  None. FINDINGS: Weber B fracture pattern of the ankle with oblique lateral malleolar fracture, transverse medial malleolar fracture, and questionable posterior malleolar fracture. The tibial shaft is subluxed about 10 mm medially with respect to the talus. Surrounding soft tissue swelling noted. Plantar calcaneal spur.  Vascular calcifications noted. IMPRESSION: 1. Weber B fracture pattern with fractures of the lateral and medial malleolus and probable fracture of the posterior malleolus given the medial subluxation of the tibia with respect to the talus. Unstable fracture. Electronically Signed   By: Van Clines M.D.   On: 06/29/2016 13:57   Ct Head Wo Contrast  Result Date: 06/29/2016 CLINICAL DATA:  Patient slipped and fell this afternoon. EXAM: CT HEAD WITHOUT CONTRAST TECHNIQUE: Contiguous axial images were obtained from the base of the skull through the vertex without intravenous contrast. COMPARISON:  None. FINDINGS: Brain: Mild superficial and central atrophy with chronic appearing small vessel ischemic disease of periventricular white matter. No acute intracranial hemorrhage, mass or midline shift. Basal cisterns are midline without effacement. Vascular: Moderate calcification of the carotid siphons bilaterally. No hyperdense vasculature. Skull: Nonacute. Sinuses/Orbits: Mucous retention cyst partially imaged in the right maxillary sinus. Mastoids are clear. Orbits are intact with  bilateral lens replacement surgical change. Other: None IMPRESSION: No acute intracranial abnormality. Chronic appearing small vessel ischemic disease of periventricular white matter with cerebral atrophy. Electronically Signed   By: Ashley Royalty M.D.   On: 06/29/2016 14:05    EKG: Independently reviewed. Atrial fibrillation rate of 72 bpm, left axis deviation.  Assessment/Plan Active Problems:   * No active hospital problems. *  This is an 83 year old male who presented to hospital after sustaining a mechanical fall. No head trauma or syncope. On initial physical examination blood pressure 166/80, heart 72, respiratory 11, saturation 97% room air. Oral mucosa is dry, his lungs are clear to auscultation, heart S1-S2  present irregularly irregular, lower extremities no edema, right leg with cast in place. Sodium 142, potassium 4.2, chloride 105, glucose 155, BUN 25, creatinine 1.60, white count 7.5, hemoglobin 10.8, hematocrit 32.5, platelets 139. CT head no acute abnormalities. Right ankle with Weber B fracture pattern.   The patient will be admitted to hospital with the working diagnosis of right ankle fracture with high fall risk.  1. Right ankle fracture. Will continue supportive care, cast has placed on the right ankle, orthopedics consultation. Patient will get morphine intravenously as needed for pain. Physical therapy evaluation. Plan is for surgical intervention in the morning. Patient is medically stable for orthopedic procedure.  2. Atrial fibrillation. Chronic and stable. Continue diltiazem 120 mg daily. Patient not on any anticoagulation, patient euvolemic, hold on torsemide for now.  3. Type 2 diabetes mellitus. Will hold on oral hypoglycemic agents, continue insulin sliding-scale for glucose coverage and monitoring. Nothing by mouth after midnight in preparation for surgical procedure.   4. Dyslipidemia continue simvastatin.  5. BPH. Continue tamsulosin and finasteride.   6.  Chronic kidney disease. Stage III.Marland Kitchen Base creatinine around 2.0, calculated GFR about 30. Plan to hold torsemide for now.    DVT prophylaxis: enoxparin Code Status: Full Family Communication: I spoke with patient's family at the bedside and all questions were addressed.  Disposition Plan: SNF Consults called: Orthopedics Admission status: Inpatient   Paizley Ramella Gerome Apley MD [facility] Pager [phone]  If 7PM-7AM, please contact night-coverage [email or web address] Password [facility]  06/29/2016, 4:23 PM

## 2016-06-29 NOTE — ED Notes (Signed)
Bed: WA07 Expected date:  Expected time:  Means of arrival:  Comments: 

## 2016-06-29 NOTE — ED Notes (Signed)
Patient requesting more pain medications.  Sent Dr Cathlean Sauer message through [facility] requesting something stronger than acetaminophen.

## 2016-06-29 NOTE — Anesthesia Preprocedure Evaluation (Addendum)
Anesthesia Evaluation  Patient identified by MRN, date of birth, ID band Patient awake    Reviewed: Allergy & Precautions, NPO status , Patient's Chart, lab work & pertinent test results  History of Anesthesia Complications Negative for: history of anesthetic complications  Airway Mallampati: II  TM Distance: >3 FB Neck ROM: Full    Dental  (+) Edentulous Upper, Edentulous Lower   Pulmonary COPD, former smoker (quit 1994),    breath sounds clear to auscultation       Cardiovascular hypertension, Pt. on medications (-) angina+ dysrhythmias Atrial Fibrillation  Rhythm:Irregular Rate:Normal  '16 ECHO:  Normal LVF, mod MR, mild TR   Neuro/Psych Recurrent falls    GI/Hepatic Neg liver ROS, GERD  Medicated and Controlled,  Endo/Other  diabetes (glu 137)  Renal/GU Renal InsufficiencyRenal disease (creat 1.60)     Musculoskeletal   Abdominal (+) - obese,   Peds  Hematology  (+) Blood dyscrasia (Hb 9.7), anemia ,   Anesthesia Other Findings   Reproductive/Obstetrics                            Anesthesia Physical Anesthesia Plan  ASA: III  Anesthesia Plan: General   Post-op Pain Management:    Induction: Intravenous  Airway Management Planned: Oral ETT  Additional Equipment:   Intra-op Plan:   Post-operative Plan: Extubation in OR  Informed Consent: I have reviewed the patients History and Physical, chart, labs and discussed the procedure including the risks, benefits and alternatives for the proposed anesthesia with the patient or authorized representative who has indicated his/her understanding and acceptance.   Dental advisory given  Plan Discussed with: CRNA and Surgeon  Anesthesia Plan Comments: (Plan routine monitors, GETA )        Anesthesia Quick Evaluation

## 2016-06-30 ENCOUNTER — Inpatient Hospital Stay (HOSPITAL_COMMUNITY): Payer: Medicare PPO

## 2016-06-30 ENCOUNTER — Encounter (HOSPITAL_COMMUNITY)
Admission: EM | Disposition: A | Discharge status: Skilled Nursing Facility | Payer: Self-pay | Source: Home / Self Care | Attending: Internal Medicine

## 2016-06-30 ENCOUNTER — Encounter (HOSPITAL_COMMUNITY): Payer: Self-pay | Admitting: Certified Registered Nurse Anesthetist

## 2016-06-30 ENCOUNTER — Inpatient Hospital Stay (HOSPITAL_COMMUNITY): Payer: Medicare PPO | Admitting: Anesthesiology

## 2016-06-30 DIAGNOSIS — N400 Enlarged prostate without lower urinary tract symptoms: Secondary | ICD-10-CM

## 2016-06-30 HISTORY — PX: ORIF ANKLE FRACTURE: SHX5408

## 2016-06-30 LAB — COMPREHENSIVE METABOLIC PANEL
ALBUMIN: 3.4 g/dL — AB (ref 3.5–5.0)
ALK PHOS: 89 U/L (ref 38–126)
ALT: 13 U/L — ABNORMAL LOW (ref 17–63)
AST: 16 U/L (ref 15–41)
Anion gap: 6 (ref 5–15)
BILIRUBIN TOTAL: 0.8 mg/dL (ref 0.3–1.2)
BUN: 20 mg/dL (ref 6–20)
CALCIUM: 9.2 mg/dL (ref 8.9–10.3)
CO2: 26 mmol/L (ref 22–32)
Chloride: 108 mmol/L (ref 101–111)
Creatinine, Ser: 1.53 mg/dL — ABNORMAL HIGH (ref 0.61–1.24)
GFR calc Af Amer: 48 mL/min — ABNORMAL LOW (ref >60)
GFR calc non Af Amer: 41 mL/min — ABNORMAL LOW (ref >60)
GLUCOSE: 137 mg/dL — AB (ref 65–99)
Potassium: 3.9 mmol/L (ref 3.5–5.1)
Sodium: 140 mmol/L (ref 135–145)
TOTAL PROTEIN: 5.8 g/dL — AB (ref 6.5–8.1)

## 2016-06-30 LAB — CBC
HEMATOCRIT: 29.9 % — AB (ref 39.0–52.0)
Hemoglobin: 9.7 g/dL — ABNORMAL LOW (ref 13.0–17.0)
MCH: 28.5 pg (ref 26.0–34.0)
MCHC: 32.4 g/dL (ref 30.0–36.0)
MCV: 87.9 fL (ref 78.0–100.0)
Platelets: 133 10*3/uL — ABNORMAL LOW (ref 150–400)
RBC: 3.4 MIL/uL — ABNORMAL LOW (ref 4.22–5.81)
RDW: 14.7 % (ref 11.5–15.5)
WBC: 6 10*3/uL (ref 4.0–10.5)

## 2016-06-30 LAB — GLUCOSE, CAPILLARY
Glucose-Capillary: 127 mg/dL — ABNORMAL HIGH (ref 65–99)
Glucose-Capillary: 126 mg/dL — ABNORMAL HIGH (ref 65–99)
Glucose-Capillary: 130 mg/dL — ABNORMAL HIGH (ref 65–99)
Glucose-Capillary: 148 mg/dL — ABNORMAL HIGH (ref 65–99)
Glucose-Capillary: 166 mg/dL — ABNORMAL HIGH (ref 65–99)

## 2016-06-30 LAB — SURGICAL PCR SCREEN
MRSA, PCR: NEGATIVE
STAPHYLOCOCCUS AUREUS: NEGATIVE

## 2016-06-30 SURGERY — OPEN REDUCTION INTERNAL FIXATION (ORIF) ANKLE FRACTURE
Anesthesia: General | Laterality: Right

## 2016-06-30 MED ORDER — FENTANYL CITRATE (PF) 100 MCG/2ML IJ SOLN
INTRAMUSCULAR | Status: AC
  Filled 2016-06-30: qty 4

## 2016-06-30 MED ORDER — LIDOCAINE 2% (20 MG/ML) 5 ML SYRINGE
INTRAMUSCULAR | Status: AC
  Filled 2016-06-30: qty 5

## 2016-06-30 MED ORDER — PHENYLEPHRINE HCL 10 MG/ML IJ SOLN
INTRAMUSCULAR | Status: DC | PRN
  Administered 2016-06-30 (×5): 40 ug via INTRAVENOUS

## 2016-06-30 MED ORDER — LIDOCAINE HCL (CARDIAC) 20 MG/ML IV SOLN
INTRAVENOUS | Status: DC | PRN
  Administered 2016-06-30: 20 mg via INTRAVENOUS

## 2016-06-30 MED ORDER — PROPOFOL 10 MG/ML IV BOLUS
INTRAVENOUS | Status: AC
  Filled 2016-06-30: qty 20

## 2016-06-30 MED ORDER — POLYETHYLENE GLYCOL 3350 17 G PO PACK
17.0000 g | PACK | Freq: Every day | ORAL | Status: DC
  Administered 2016-06-30 – 2016-07-03 (×4): 17 g via ORAL
  Filled 2016-06-30 (×3): qty 1

## 2016-06-30 MED ORDER — CLINDAMYCIN PHOSPHATE 600 MG/50ML IV SOLN
600.0000 mg | Freq: Four times a day (QID) | INTRAVENOUS | Status: AC
  Administered 2016-06-30 – 2016-07-01 (×3): 600 mg via INTRAVENOUS
  Filled 2016-06-30 (×3): qty 50

## 2016-06-30 MED ORDER — DOCUSATE SODIUM 100 MG PO CAPS
100.0000 mg | ORAL_CAPSULE | Freq: Two times a day (BID) | ORAL | Status: DC
  Administered 2016-06-30 – 2016-07-03 (×7): 100 mg via ORAL
  Filled 2016-06-30 (×6): qty 1

## 2016-06-30 MED ORDER — ACETAMINOPHEN 650 MG RE SUPP: 650.0000 mg | Freq: Four times a day (QID) | RECTAL | Status: DC | PRN

## 2016-06-30 MED ORDER — ONDANSETRON HCL 4 MG PO TABS: 4.0000 mg | ORAL_TABLET | Freq: Four times a day (QID) | ORAL | Status: DC | PRN

## 2016-06-30 MED ORDER — METOCLOPRAMIDE HCL 5 MG/ML IJ SOLN: 5.0000 mg | Freq: Three times a day (TID) | INTRAMUSCULAR | Status: DC | PRN

## 2016-06-30 MED ORDER — FENTANYL CITRATE (PF) 100 MCG/2ML IJ SOLN
INTRAMUSCULAR | Status: DC | PRN
  Administered 2016-06-30 (×4): 50 ug via INTRAVENOUS

## 2016-06-30 MED ORDER — ACETAMINOPHEN 325 MG PO TABS: 650.0000 mg | ORAL_TABLET | Freq: Four times a day (QID) | ORAL | Status: DC | PRN

## 2016-06-30 MED ORDER — PROPOFOL 10 MG/ML IV BOLUS
INTRAVENOUS | Status: DC | PRN
  Administered 2016-06-30: 130 mg via INTRAVENOUS

## 2016-06-30 MED ORDER — 0.9 % SODIUM CHLORIDE (POUR BTL) OPTIME
TOPICAL | Status: DC | PRN
  Administered 2016-06-30: 1000 mL

## 2016-06-30 MED ORDER — EPHEDRINE SULFATE 50 MG/ML IJ SOLN
INTRAMUSCULAR | Status: DC | PRN
  Administered 2016-06-30: 10 mg via INTRAVENOUS
  Administered 2016-06-30: 5 mg via INTRAVENOUS

## 2016-06-30 MED ORDER — ROCURONIUM BROMIDE 100 MG/10ML IV SOLN
INTRAVENOUS | Status: DC | PRN
  Administered 2016-06-30: 50 mg via INTRAVENOUS

## 2016-06-30 MED ORDER — ONDANSETRON HCL 4 MG/2ML IJ SOLN
INTRAMUSCULAR | Status: AC
  Filled 2016-06-30: qty 2

## 2016-06-30 MED ORDER — ONDANSETRON HCL 4 MG/2ML IJ SOLN
INTRAMUSCULAR | Status: DC | PRN
  Administered 2016-06-30: 4 mg via INTRAVENOUS

## 2016-06-30 MED ORDER — ONDANSETRON HCL 4 MG/2ML IJ SOLN: 4.0000 mg | Freq: Four times a day (QID) | INTRAMUSCULAR | Status: DC | PRN

## 2016-06-30 MED ORDER — POTASSIUM CHLORIDE IN NACL 20-0.9 MEQ/L-% IV SOLN
INTRAVENOUS | Status: AC
  Administered 2016-06-30 (×2): via INTRAVENOUS
  Filled 2016-06-30: qty 1000

## 2016-06-30 MED ORDER — METOCLOPRAMIDE HCL 5 MG PO TABS: 5.0000 mg | ORAL_TABLET | Freq: Three times a day (TID) | ORAL | Status: DC | PRN

## 2016-06-30 MED ORDER — ROCURONIUM BROMIDE 50 MG/5ML IV SOSY
PREFILLED_SYRINGE | INTRAVENOUS | Status: AC
  Filled 2016-06-30: qty 5

## 2016-06-30 MED ORDER — LACTATED RINGERS IV SOLN
INTRAVENOUS | Status: DC | PRN
  Administered 2016-06-30 (×2): via INTRAVENOUS

## 2016-06-30 MED ORDER — SUGAMMADEX SODIUM 200 MG/2ML IV SOLN
INTRAVENOUS | Status: DC | PRN
  Administered 2016-06-30: 150 mg via INTRAVENOUS

## 2016-06-30 MED ORDER — POTASSIUM CHLORIDE IN NACL 20-0.9 MEQ/L-% IV SOLN
INTRAVENOUS | Status: DC
  Administered 2016-06-30: 13:00:00 via INTRAVENOUS

## 2016-06-30 MED ORDER — SUGAMMADEX SODIUM 200 MG/2ML IV SOLN
INTRAVENOUS | Status: AC
  Filled 2016-06-30: qty 2

## 2016-06-30 SURGICAL SUPPLY — 81 items
BANDAGE ACE 4X5 VEL STRL LF (GAUZE/BANDAGES/DRESSINGS) ×6 IMPLANT
BANDAGE ACE 6X5 VEL STRL LF (GAUZE/BANDAGES/DRESSINGS) ×3 IMPLANT
BANDAGE ESMARK 6X9 LF (GAUZE/BANDAGES/DRESSINGS) IMPLANT
BENZOIN TINCTURE PRP APPL 2/3 (GAUZE/BANDAGES/DRESSINGS) IMPLANT
BIT DRILL 2 CANN GRADUATED (BIT) ×3 IMPLANT
BIT DRILL 2.5 CANN LNG (BIT) ×3 IMPLANT
BIT DRILL 2.6 CANN (BIT) ×3 IMPLANT
BNDG ESMARK 6X9 LF (GAUZE/BANDAGES/DRESSINGS)
BNDG GAUZE ELAST 4 BULKY (GAUZE/BANDAGES/DRESSINGS) ×3 IMPLANT
BNDG PLASTER X FAST 3X3 WHT LF (CAST SUPPLIES) ×3 IMPLANT
BOOTCOVER CLEANROOM LRG (PROTECTIVE WEAR) IMPLANT
CANISTER SUCTION 2500CC (MISCELLANEOUS) ×3 IMPLANT
CLOSURE STERI-STRIP 1/2X4 (GAUZE/BANDAGES/DRESSINGS)
CLSR STERI-STRIP ANTIMIC 1/2X4 (GAUZE/BANDAGES/DRESSINGS) IMPLANT
COVER SURGICAL LIGHT HANDLE (MISCELLANEOUS) ×3 IMPLANT
CUFF TOURNIQUET SINGLE 34IN LL (TOURNIQUET CUFF) IMPLANT
CUFF TOURNIQUET SINGLE 44IN (TOURNIQUET CUFF) IMPLANT
DECANTER SPIKE VIAL GLASS SM (MISCELLANEOUS) IMPLANT
DRAPE C-ARM 42X72 X-RAY (DRAPES) ×3 IMPLANT
DRAPE C-ARMOR (DRAPES) ×3 IMPLANT
DRAPE INCISE IOBAN 66X45 STRL (DRAPES) IMPLANT
DRAPE U-SHAPE 47X51 STRL (DRAPES) IMPLANT
DRSG ADAPTIC 3X8 NADH LF (GAUZE/BANDAGES/DRESSINGS) ×3 IMPLANT
DRSG PAD ABDOMINAL 8X10 ST (GAUZE/BANDAGES/DRESSINGS) ×3 IMPLANT
DURAPREP 26ML APPLICATOR (WOUND CARE) IMPLANT
ELECT REM PT RETURN 9FT ADLT (ELECTROSURGICAL) ×3
ELECTRODE REM PT RTRN 9FT ADLT (ELECTROSURGICAL) ×1 IMPLANT
GAUZE SPONGE 4X4 12PLY STRL (GAUZE/BANDAGES/DRESSINGS) IMPLANT
GAUZE XEROFORM 1X8 LF (GAUZE/BANDAGES/DRESSINGS) ×3 IMPLANT
GLOVE BIOGEL PI ORTHO PRO SZ8 (GLOVE) ×4
GLOVE ORTHO TXT STRL SZ7.5 (GLOVE) ×3 IMPLANT
GLOVE PI ORTHO PRO STRL SZ8 (GLOVE) ×2 IMPLANT
GLOVE SURG ORTHO 8.0 STRL STRW (GLOVE) ×3 IMPLANT
GOWN STRL REUS W/ TWL LRG LVL3 (GOWN DISPOSABLE) ×1 IMPLANT
GOWN STRL REUS W/ TWL XL LVL3 (GOWN DISPOSABLE) ×2 IMPLANT
GOWN STRL REUS W/TWL LRG LVL3 (GOWN DISPOSABLE) ×2
GOWN STRL REUS W/TWL XL LVL3 (GOWN DISPOSABLE) ×4
GUIDEWIRE 1.35MM (WIRE) ×6 IMPLANT
KIT BASIN OR (CUSTOM PROCEDURE TRAY) ×3 IMPLANT
KIT ROOM TURNOVER OR (KITS) ×3 IMPLANT
MANIFOLD NEPTUNE II (INSTRUMENTS) IMPLANT
NS IRRIG 1000ML POUR BTL (IV SOLUTION) ×3 IMPLANT
PACK ORTHO EXTREMITY (CUSTOM PROCEDURE TRAY) ×3 IMPLANT
PAD ABD 8X10 STRL (GAUZE/BANDAGES/DRESSINGS) ×3 IMPLANT
PAD ARMBOARD 7.5X6 YLW CONV (MISCELLANEOUS) ×6 IMPLANT
PAD CAST 4YDX4 CTTN HI CHSV (CAST SUPPLIES) ×1 IMPLANT
PADDING CAST ABS 4INX4YD NS (CAST SUPPLIES) ×2
PADDING CAST ABS 6INX4YD NS (CAST SUPPLIES) ×4
PADDING CAST ABS COTTON 4X4 ST (CAST SUPPLIES) ×1 IMPLANT
PADDING CAST ABS COTTON 6X4 NS (CAST SUPPLIES) ×2 IMPLANT
PADDING CAST COTTON 4X4 STRL (CAST SUPPLIES) ×2
PLATE FIBULA 6 HOLE RT LOCK (Plate) ×2 IMPLANT
SCREW CANN 4.0X34MM SHT THD (Screw) ×2 IMPLANT
SCREW CANN 4.0X36MM SHT THD (Screw) ×3 IMPLANT
SCREW CORTICAL 2.7X18 LO PRO (Screw) ×2 IMPLANT
SCREW LOCK T10 FT 18X2.7X (Screw) ×4 IMPLANT
SCREW LOCK T15 FT 10X3.5XST (Screw) ×1 IMPLANT
SCREW LOCK T15 FT 14X3.5XST (Screw) ×1 IMPLANT
SCREW LOCKING 2.7X14MM (Screw) ×3 IMPLANT
SCREW LOCKING 2.7X18MM (Screw) ×8 IMPLANT
SCREW LOCKING 3.5X10 (Screw) ×2 IMPLANT
SCREW LOCKING 3.5X14MM (Screw) ×2 IMPLANT
SCREW LOW PROFILE 3.5X14 (Screw) ×6 IMPLANT
SCREW LOW PROFILE 3.5X16 (Screw) ×3 IMPLANT
SPONGE GAUZE 4X4 12PLY STER LF (GAUZE/BANDAGES/DRESSINGS) ×3 IMPLANT
SPONGE LAP 4X18 X RAY DECT (DISPOSABLE) ×3 IMPLANT
SUCTION FRAZIER HANDLE 10FR (MISCELLANEOUS) ×2
SUCTION TUBE FRAZIER 10FR DISP (MISCELLANEOUS) ×1 IMPLANT
SUT ETHILON 2 0 PSLX (SUTURE) ×3 IMPLANT
SUT MNCRL AB 3-0 PS2 18 (SUTURE) IMPLANT
SUT VIC AB 0 CT1 27 (SUTURE) ×2
SUT VIC AB 0 CT1 27XBRD ANBCTR (SUTURE) ×1 IMPLANT
SUT VIC AB 3-0 SH 8-18 (SUTURE) IMPLANT
SYR CONTROL 10ML LL (SYRINGE) IMPLANT
TOWEL OR 17X24 6PK STRL BLUE (TOWEL DISPOSABLE) ×3 IMPLANT
TOWEL OR 17X26 10 PK STRL BLUE (TOWEL DISPOSABLE) ×3 IMPLANT
TUBE CONNECTING 12'X1/4 (SUCTIONS) ×1
TUBE CONNECTING 12X1/4 (SUCTIONS) ×2 IMPLANT
UNDERPAD 30X30 (UNDERPADS AND DIAPERS) ×3 IMPLANT
WATER STERILE IRR 1000ML POUR (IV SOLUTION) IMPLANT
YANKAUER SUCT BULB TIP NO VENT (SUCTIONS) ×3 IMPLANT

## 2016-06-30 NOTE — Consult Note (Signed)
Orthopaedic Trauma Service Consultation  Reason for Consult: right ankle deformity Referring Physician: Mauricio Daniel Arrien MD  Anthem Beaudin is an 83 y.o. male.  HPI: Fall on ice in the driveway with resulting deformity. Splinted in ED yesterday. Denies numbness and can flex and extend great and lesser toes. Denies LOC. Pain improved with splint. Niece in room.  Past Medical History:  Diagnosis Date  . Anemia   . Arthritis   . Atrial fibrillation ([facility])   . Diabetes mellitus without complication ([facility])   . Edema    right leg  . Enlarged prostate   . Esophageal reflux   . Hyperlipidemia   . Hypertension   . Thyroid nodule   . Tremor    right hand  . Vitamin D deficiency     Past Surgical History:  Procedure Laterality Date  . JOINT REPLACEMENT Left    knee  . KNEE SURGERY      History reviewed. No pertinent family history.  Social History:  reports that he quit smoking about 23 years ago. His smoking use included Cigarettes. He has never used smokeless tobacco. He reports that he does not drink alcohol. His drug history is not on file.  Allergies:  Allergies  Allergen Reactions  . Keflet [Cephalexin] Rash    Medications:  Prior to Admission:  Prescriptions Prior to Admission  Medication Sig Dispense Refill Last Dose  . sitaGLIPtin (JANUVIA) 50 MG tablet Take 50 mg by mouth daily.   unknown  . Cholecalciferol (VITAMIN D3) 3000 UNITS TABS Take 1 tablet by mouth daily.   unknown  . diltiazem (CARDIZEM CD) 120 MG 24 hr capsule Take 1 capsule (120 mg total) by mouth daily. 30 capsule 0 unknown  . exenatide (BYETTA) 5 MCG/0.02ML SOPN injection Inject 5 mcg into the skin daily as needed (bs > 130).   unknown  . ferrous sulfate 325 (65 FE) MG tablet Take 325 mg by mouth daily with breakfast.   unknown  . finasteride (PROSCAR) 5 MG tablet Take 5 mg by mouth daily.   unknown  . Mesalamine 800 MG TBEC Take 800 mg by mouth 3 (three) times daily.   unknown  . Omega-3 Fatty  Acids (OMEGA-3 FISH OIL) 300 MG CAPS Take 300 mg by mouth daily.   unknown  . oxymetazoline (AFRIN) 0.05 % nasal spray Place 1 spray into both nostrils 2 (two) times daily as needed for congestion.   unknown  . pantoprazole (PROTONIX) 40 MG tablet Take 40 mg by mouth daily.   unknown  . POTASSIUM GLUCONATE PO Take 1 tablet by mouth daily.   unknown  . simvastatin (ZOCOR) 40 MG tablet Take 40 mg by mouth daily.   unknown  . tamsulosin (FLOMAX) 0.4 MG CAPS capsule Take 0.4 mg by mouth daily. Reported on 08/17/2015   unknown  . Vitamin D, Ergocalciferol, (DRISDOL) 50000 UNITS CAPS capsule Take 50,000 Units by mouth every 7 (seven) days.   unknown    Results for orders placed or performed during the hospital encounter of 06/29/16 (from the past 48 hour(s))  CBC with Differential/Platelet     Status: Abnormal   Collection Time: 06/29/16  2:51 PM  Result Value Ref Range   WBC 7.5 4.0 - 10.5 K/uL   RBC 3.75 (L) 4.22 - 5.81 MIL/uL   Hemoglobin 10.8 (L) 13.0 - 17.0 g/dL   HCT 32.5 (L) 39.0 - 52.0 %   MCV 86.7 78.0 - 100.0 fL   MCH 28.8 26.0 - 34.0 pg     MCHC 33.2 30.0 - 36.0 g/dL   RDW 14.3 11.5 - 15.5 %   Platelets 139 (L) 150 - 400 K/uL   Neutrophils Relative % 83 %   Neutro Abs 6.1 1.7 - 7.7 K/uL   Lymphocytes Relative 10 %   Lymphs Abs 0.8 0.7 - 4.0 K/uL   Monocytes Relative 7 %   Monocytes Absolute 0.5 0.1 - 1.0 K/uL   Eosinophils Relative 0 %   Eosinophils Absolute 0.0 0.0 - 0.7 K/uL   Basophils Relative 0 %   Basophils Absolute 0.0 0.0 - 0.1 K/uL  I-stat chem 8, ed     Status: Abnormal   Collection Time: 06/29/16  3:04 PM  Result Value Ref Range   Sodium 142 135 - 145 mmol/L   Potassium 4.2 3.5 - 5.1 mmol/L   Chloride 105 101 - 111 mmol/L   BUN 25 (H) 6 - 20 mg/dL   Creatinine, Ser 1.60 (H) 0.61 - 1.24 mg/dL   Glucose, Bld 155 (H) 65 - 99 mg/dL   Calcium, Ion 1.23 1.15 - 1.40 mmol/L   TCO2 24 0 - 100 mmol/L   Hemoglobin 10.2 (L) 13.0 - 17.0 g/dL   HCT 30.0 (L) 39.0 - 52.0 %   CBC     Status: Abnormal   Collection Time: 06/29/16  9:37 PM  Result Value Ref Range   WBC 7.5 4.0 - 10.5 K/uL   RBC 3.51 (L) 4.22 - 5.81 MIL/uL   Hemoglobin 10.0 (L) 13.0 - 17.0 g/dL   HCT 30.6 (L) 39.0 - 52.0 %   MCV 87.2 78.0 - 100.0 fL   MCH 28.5 26.0 - 34.0 pg   MCHC 32.7 30.0 - 36.0 g/dL   RDW 14.6 11.5 - 15.5 %   Platelets 130 (L) 150 - 400 K/uL  Creatinine, serum     Status: Abnormal   Collection Time: 06/29/16  9:37 PM  Result Value Ref Range   Creatinine, Ser 1.52 (H) 0.61 - 1.24 mg/dL   GFR calc non Af Amer 42 (L) >60 mL/min   GFR calc Af Amer 48 (L) >60 mL/min    Comment: (NOTE) The eGFR has been calculated using the CKD EPI equation. This calculation has not been validated in all clinical situations. eGFR's persistently <60 mL/min signify possible Chronic Kidney Disease.   Surgical PCR screen     Status: None   Collection Time: 06/29/16 10:50 PM  Result Value Ref Range   MRSA, PCR NEGATIVE NEGATIVE   Staphylococcus aureus NEGATIVE NEGATIVE    Comment:        The Xpert SA Assay (FDA approved for NASAL specimens in patients over 63 years of age), is one component of a comprehensive surveillance program.  Test performance has been validated by [facility] for patients greater than or equal to 54 year old. It is not intended to diagnose infection nor to guide or monitor treatment.   Glucose, capillary     Status: Abnormal   Collection Time: 06/29/16 11:03 PM  Result Value Ref Range   Glucose-Capillary 113 (H) 65 - 99 mg/dL  Comprehensive metabolic panel     Status: Abnormal   Collection Time: 06/30/16  5:13 AM  Result Value Ref Range   Sodium 140 135 - 145 mmol/L   Potassium 3.9 3.5 - 5.1 mmol/L   Chloride 108 101 - 111 mmol/L   CO2 26 22 - 32 mmol/L   Glucose, Bld 137 (H) 65 - 99 mg/dL   BUN 20 6 -  20 mg/dL   Creatinine, Ser 1.53 (H) 0.61 - 1.24 mg/dL   Calcium 9.2 8.9 - 10.3 mg/dL   Total Protein 5.8 (L) 6.5 - 8.1 g/dL   Albumin 3.4 (L) 3.5 -  5.0 g/dL   AST 16 15 - 41 U/L   ALT 13 (L) 17 - 63 U/L   Alkaline Phosphatase 89 38 - 126 U/L   Total Bilirubin 0.8 0.3 - 1.2 mg/dL   GFR calc non Af Amer 41 (L) >60 mL/min   GFR calc Af Amer 48 (L) >60 mL/min    Comment: (NOTE) The eGFR has been calculated using the CKD EPI equation. This calculation has not been validated in all clinical situations. eGFR's persistently <60 mL/min signify possible Chronic Kidney Disease.    Anion gap 6 5 - 15  CBC     Status: Abnormal   Collection Time: 06/30/16  5:13 AM  Result Value Ref Range   WBC 6.0 4.0 - 10.5 K/uL   RBC 3.40 (L) 4.22 - 5.81 MIL/uL   Hemoglobin 9.7 (L) 13.0 - 17.0 g/dL   HCT 29.9 (L) 39.0 - 52.0 %   MCV 87.9 78.0 - 100.0 fL   MCH 28.5 26.0 - 34.0 pg   MCHC 32.4 30.0 - 36.0 g/dL   RDW 14.7 11.5 - 15.5 %   Platelets 133 (L) 150 - 400 K/uL  Glucose, capillary     Status: Abnormal   Collection Time: 06/30/16  5:47 AM  Result Value Ref Range   Glucose-Capillary 127 (H) 65 - 99 mg/dL    Dg Ankle Complete Right  Result Date: 06/29/2016 CLINICAL DATA:  Right ankle pain after slip and fall.  Swelling. EXAM: RIGHT ANKLE - COMPLETE 3+ VIEW COMPARISON:  None. FINDINGS: Weber B fracture pattern of the ankle with oblique lateral malleolar fracture, transverse medial malleolar fracture, and questionable posterior malleolar fracture. The tibial shaft is subluxed about 10 mm medially with respect to the talus. Surrounding soft tissue swelling noted. Plantar calcaneal spur.  Vascular calcifications noted. IMPRESSION: 1. Weber B fracture pattern with fractures of the lateral and medial malleolus and probable fracture of the posterior malleolus given the medial subluxation of the tibia with respect to the talus. Unstable fracture. Electronically Signed   By: Van Clines M.D.   On: 06/29/2016 13:57   Ct Head Wo Contrast  Result Date: 06/29/2016 CLINICAL DATA:  Patient slipped and fell this afternoon. EXAM: CT HEAD WITHOUT CONTRAST  TECHNIQUE: Contiguous axial images were obtained from the base of the skull through the vertex without intravenous contrast. COMPARISON:  None. FINDINGS: Brain: Mild superficial and central atrophy with chronic appearing small vessel ischemic disease of periventricular white matter. No acute intracranial hemorrhage, mass or midline shift. Basal cisterns are midline without effacement. Vascular: Moderate calcification of the carotid siphons bilaterally. No hyperdense vasculature. Skull: Nonacute. Sinuses/Orbits: Mucous retention cyst partially imaged in the right maxillary sinus. Mastoids are clear. Orbits are intact with bilateral lens replacement surgical change. Other: None IMPRESSION: No acute intracranial abnormality. Chronic appearing small vessel ischemic disease of periventricular white matter with cerebral atrophy. Electronically Signed   By: Ashley Royalty M.D.   On: 06/29/2016 14:05    ROS No recent fever, bleeding abnormalities, urologic dysfunction, GI problems, or weight gain.  Blood pressure (!) 151/65, pulse 72, temperature 98.1 F (36.7 C), temperature source Oral, resp. rate 12, height 6' (1.829 m), weight 97.5 kg (215 lb), SpO2 98 %. Physical Exam NCAT, pleasant RLE Splint intact, clean, dry  Valgus deformity  Edema/ swelling present   Sens: DPN, SPN, TN intact  Motor: EHL, FHL, and lessor toe ext and flex all intact grossly  Brisk cap refill, warm to touch  Assessment/Plan: Right trimalleolar fracture subluxation/ dislocation.  I discussed with the patient the risks and benefits of surgery, including the possibility of infection, including deep infection that could result in limb loss, nerve injury, vessel injury, wound breakdown, arthritis, symptomatic hardware, DVT/ PE, loss of motion, and need for further surgery among others.  We also specifically discussed the need to stage surgery because of the elevated risk of soft tissue breakdown that could lead to amputation.  He  understood these risks and wished to proceed.   Altamese Mesa, MD [facility] [phone] (p)  06/30/2016  7:47 AM

## 2016-06-30 NOTE — Progress Notes (Signed)
[facility] PROGRESS NOTE  Glen Lee VEH:[id number] DOB: 1934/02/23 DOA: 06/29/2016 PCP: Daphene Jaeger, PA-C  Assessment/Plan: 83 y.o. male with PMH of HTN, DM, DJD, A fib, Neuropathy, ambulatory dysfunction, who presents to the hospital after staying mechanical fall. Found to have right ankle fracture with high fall risk.  Right ankle fracture. Underwent ORIF today. Cont pain control. Monitor, pt/ot   H/o atrial fibrillation. Chronic and stable. Continue diltiazem 120 mg daily. Patient not on any anticoagulation due to bleeding, recurrent falls. patient euvolemic, hold on torsemide for now. Type 2 diabetes mellitus. monitor on ISS BPH. Continue tamsulosin and finasteride.  Chronic kidney disease. Stage III.[place] creatinine around 2.0, calculated GFR about 30. Plan to hold torsemide for now.   Code Status: full Family Communication: d/w patient (indicate person spoken with, relationship, and if by phone, the number) Disposition Plan: obtain pt/ot    Consultants:  Ortho   Procedures:  ORIF on 1/19  Antibiotics:  periop clinda (indicate start date, and stop date if known)  HPI/Subjective: Reports feeling well. No distress, wants to sleep   Objective: Vitals:   06/30/16 1023 06/30/16 1032  BP: 130/66   Pulse: 73 83  Resp: 18 17  Temp:  97 F (36.1 C)    Intake/Output Summary (Last 24 hours) at 06/30/16 1409 Last data filed at 06/30/16 1034  Gross per 24 hour  Intake             1100 ml  Output              325 ml  Net              775 ml   Filed Weights   06/29/16 1300  Weight: 97.5 kg (215 lb)    Exam:   General:  Comfortable   Cardiovascular: s1,s2 rrr  Respiratory: CTA BL  Abdomen: soft, nt, nd  Musculoskeletal: post op pain in right ankle    Data Reviewed: Basic Metabolic Panel:  Recent Labs Lab 06/29/16 1504 06/29/16 2137 06/30/16 0513  NA 142  --  140  K 4.2  --  3.9  CL 105  --  108  CO2  --   --  26  GLUCOSE 155*  --   137*  BUN 25*  --  20  CREATININE 1.60* 1.52* 1.53*  CALCIUM  --   --  9.2   Liver Function Tests:  Recent Labs Lab 06/30/16 0513  AST 16  ALT 13*  ALKPHOS 89  BILITOT 0.8  PROT 5.8*  ALBUMIN 3.4*   No results for input(s): LIPASE, AMYLASE in the last 168 hours. No results for input(s): AMMONIA in the last 168 hours. CBC:  Recent Labs Lab 06/29/16 1451 06/29/16 1504 06/29/16 2137 06/30/16 0513  WBC 7.5  --  7.5 6.0  NEUTROABS 6.1  --   --   --   HGB 10.8* 10.2* 10.0* 9.7*  HCT 32.5* 30.0* 30.6* 29.9*  MCV 86.7  --  87.2 87.9  PLT 139*  --  130* 133*   Cardiac Enzymes: No results for input(s): CKTOTAL, CKMB, CKMBINDEX, TROPONINI in the last 168 hours. BNP (last 3 results) No results for input(s): BNP in the last 8760 hours.  ProBNP (last 3 results) No results for input(s): PROBNP in the last 8760 hours.  CBG:  Recent Labs Lab 06/29/16 2303 06/30/16 0547 06/30/16 1011 06/30/16 1135  GLUCAP 113* 127* 126* 130*    Recent Results (from the past 240 hour(s))  Surgical PCR  screen     Status: None   Collection Time: 06/29/16 10:50 PM  Result Value Ref Range Status   MRSA, PCR NEGATIVE NEGATIVE Final   Staphylococcus aureus NEGATIVE NEGATIVE Final    Comment:        The Xpert SA Assay (FDA approved for NASAL specimens in patients over 41 years of age), is one component of a comprehensive surveillance program.  Test performance has been validated by [facility] for patients greater than or equal to 34 year old. It is not intended to diagnose infection nor to guide or monitor treatment.      Studies: Dg Ankle Complete Right  Result Date: 06/30/2016 CLINICAL DATA:  Status post ankle fracture. EXAM: RIGHT ANKLE - COMPLETE 3+ VIEW COMPARISON:  06/30/2016 FINDINGS: Exam detail is diminished secondary to overlying casting material. Sideplate and screw device is identified reducing the distal fibular fracture. Two screws reduces the medial malleolar  fracture. The hardware components and fracture fragments are in anatomic alignment. IMPRESSION: 1. Status post ORIF of fractures involving the distal fibula and medial malleolus. Electronically Signed   By: Kerby Moors M.D.   On: 06/30/2016 11:27   Dg Ankle Complete Right  Result Date: 06/30/2016 CLINICAL DATA:  Intraoperative imaging for fixation of right ankle fracture suffered in a slip and fall 06/29/2016. Initial encounter. EXAM: RIGHT ANKLE - COMPLETE 3+ VIEW; DG C-ARM 61-120 MIN COMPARISON:  Plain films of the right ankle 06/29/2016. FINDINGS: We are provided with 5 fluoroscopic spot views of the right ankle. Images demonstrate 2 screws in the medial malleolus and lateral plate and screws for fixation of distal tibial and fibular fractures. Hardware is intact. Position and alignment appear anatomic. No acute finding. IMPRESSION: ORIF right ankle fractures as described.  No acute abnormality. Electronically Signed   By: Inge Rise M.D.   On: 06/30/2016 09:35   Dg Ankle Complete Right  Result Date: 06/29/2016 CLINICAL DATA:  Right ankle pain after slip and fall.  Swelling. EXAM: RIGHT ANKLE - COMPLETE 3+ VIEW COMPARISON:  None. FINDINGS: Weber B fracture pattern of the ankle with oblique lateral malleolar fracture, transverse medial malleolar fracture, and questionable posterior malleolar fracture. The tibial shaft is subluxed about 10 mm medially with respect to the talus. Surrounding soft tissue swelling noted. Plantar calcaneal spur.  Vascular calcifications noted. IMPRESSION: 1. Weber B fracture pattern with fractures of the lateral and medial malleolus and probable fracture of the posterior malleolus given the medial subluxation of the tibia with respect to the talus. Unstable fracture. Electronically Signed   By: Van Clines M.D.   On: 06/29/2016 13:57   Ct Head Wo Contrast  Result Date: 06/29/2016 CLINICAL DATA:  Patient slipped and fell this afternoon. EXAM: CT HEAD WITHOUT  CONTRAST TECHNIQUE: Contiguous axial images were obtained from the base of the skull through the vertex without intravenous contrast. COMPARISON:  None. FINDINGS: Brain: Mild superficial and central atrophy with chronic appearing small vessel ischemic disease of periventricular white matter. No acute intracranial hemorrhage, mass or midline shift. Basal cisterns are midline without effacement. Vascular: Moderate calcification of the carotid siphons bilaterally. No hyperdense vasculature. Skull: Nonacute. Sinuses/Orbits: Mucous retention cyst partially imaged in the right maxillary sinus. Mastoids are clear. Orbits are intact with bilateral lens replacement surgical change. Other: None IMPRESSION: No acute intracranial abnormality. Chronic appearing small vessel ischemic disease of periventricular white matter with cerebral atrophy. Electronically Signed   By: Ashley Royalty M.D.   On: 06/29/2016 14:05   Dg C-arm  61-120 Min  Result Date: 06/30/2016 CLINICAL DATA:  Intraoperative imaging for fixation of right ankle fracture suffered in a slip and fall 06/29/2016. Initial encounter. EXAM: RIGHT ANKLE - COMPLETE 3+ VIEW; DG C-ARM 61-120 MIN COMPARISON:  Plain films of the right ankle 06/29/2016. FINDINGS: We are provided with 5 fluoroscopic spot views of the right ankle. Images demonstrate 2 screws in the medial malleolus and lateral plate and screws for fixation of distal tibial and fibular fractures. Hardware is intact. Position and alignment appear anatomic. No acute finding. IMPRESSION: ORIF right ankle fractures as described.  No acute abnormality. Electronically Signed   By: Inge Rise M.D.   On: 06/30/2016 09:35    Scheduled Meds: . cholecalciferol  3,000 Units Oral Daily  . clindamycin (CLEOCIN) IV  600 mg Intravenous Q6H  . diltiazem  120 mg Oral Daily  . docusate sodium  100 mg Oral BID  . enoxaparin (LOVENOX) injection  40 mg Subcutaneous Q24H  . ferrous sulfate  325 mg Oral Q breakfast  .  finasteride  5 mg Oral Daily  . insulin aspart  0-9 Units Subcutaneous TID WC  . Mesalamine  800 mg Oral TID  . pantoprazole  40 mg Oral Daily  . polyethylene glycol  17 g Oral Daily  . simvastatin  40 mg Oral QHS  . sodium chloride flush  3 mL Intravenous Q12H  . tamsulosin  0.4 mg Oral Daily   Continuous Infusions: . 0.9 % NaCl with KCl 20 mEq / L 75 mL/hr at 06/30/16 1318    Active Problems:   Ankle fracture    Time spent: >35 minutes     Kinnie Feil  [facility] Pager [phone]. If 7PM-7AM, please contact night-coverage at [email or web address], password [facility] 06/30/2016, 2:09 PM  LOS: 1 day

## 2016-06-30 NOTE — Discharge Instructions (Signed)
Orthopaedic Trauma Service Discharge Instructions   General Discharge Instructions  WEIGHT BEARING STATUS: nonweightbearing right leg  RANGE OF MOTION/ACTIVITY: no ankle range of motion on R ankle, do not remove splint  Wound Care:do not remove splint. Do not get splint wet. We will remove at first follow up visit   [place] have likely been given narcotic medications to help control your pain.  After a traumatic event that results in an fracture (broken bone) with or without surgery, it is ok to use narcotic pain medications to help control one's pain.  We understand that everyone responds to pain differently and each individual patient will be evaluated on a regular basis for the continued need for narcotic medications. Ideally, narcotic medication use should last no more than 6-8 weeks (coinciding with fracture healing).   As a patient it is your responsibility as well to monitor narcotic medication use and report the amount and frequency you use these medications when you come to your office visit.   We would also advise that if you are using narcotic medications, you should take a dose prior to therapy to maximize you participation.  IF YOU ARE ON NARCOTIC MEDICATIONS IT IS NOT PERMISSIBLE TO OPERATE A MOTOR VEHICLE (MOTORCYCLE/CAR/TRUCK/MOPED) OR HEAVY MACHINERY DO NOT MIX NARCOTICS WITH OTHER CNS (CENTRAL NERVOUS SYSTEM) DEPRESSANTS SUCH AS ALCOHOL  Diet: as you were eating previously.  Can use over the counter stool softeners and bowel preparations, such as Miralax, to help with bowel movements.  Narcotics can be constipating.  Be sure to drink plenty of fluids    STOP SMOKING OR USING NICOTINE PRODUCTS!!!!  As discussed nicotine severely impairs your body's ability to heal surgical and traumatic wounds but also impairs bone healing.  Wounds and bone heal by forming microscopic blood vessels (angiogenesis) and nicotine is a vasoconstrictor  (essentially, shrinks blood vessels).  Therefore, if vasoconstriction occurs to these microscopic blood vessels they essentially disappear and are unable to deliver necessary nutrients to the healing tissue.  This is one modifiable factor that you can do to dramatically increase your chances of healing your injury.    (This means no smoking, no nicotine gum, patches, etc)  DO NOT USE NONSTEROIDAL ANTI-INFLAMMATORY DRUGS (NSAID'S)  Using products such as Advil (ibuprofen), Aleve (naproxen), Motrin (ibuprofen) for additional pain control during fracture healing can delay and/or prevent the healing response.  If you would like to take over the counter (OTC) medication, Tylenol (acetaminophen) is ok.  However, some narcotic medications that are given for pain control contain acetaminophen as well. Therefore, you should not exceed more than 4000 mg of tylenol in a day if you do not have liver disease.  Also note that there are may OTC medicines, such as cold medicines and allergy medicines that my contain tylenol as well.  If you have any questions about medications and/or interactions please ask your doctor/PA or your pharmacist.      ICE AND ELEVATE INJURED/OPERATIVE EXTREMITY  Using ice and elevating the injured extremity above your heart can help with swelling and pain control.  Icing in a pulsatile fashion, such as 20 minutes on and 20 minutes off, can be followed.    Do not place ice directly on skin. Make sure there is a barrier between to skin and the ice pack.    Using frozen items such as frozen peas works well as the conform nicely to the are that needs to be iced.  USE AN ACE  WRAP OR TED HOSE FOR SWELLING CONTROL  In addition to icing and elevation, Ace wraps or TED hose are used to help limit and resolve swelling.  It is recommended to use Ace wraps or TED hose until you are informed to stop.    When using Ace Wraps start the wrapping distally (farthest away from the body) and wrap proximally  (closer to the body)   Example: If you had surgery on your leg or thing and you do not have a splint on, start the ace wrap at the toes and work your way up to the thigh        If you had surgery on your upper extremity and do not have a splint on, start the ace wrap at your fingers and work your way up to the upper arm  IF YOU ARE IN A SPLINT OR CAST DO NOT [place]   If your splint gets wet for any reason please contact the office immediately. You may shower in your splint or cast as long as you keep it dry.  This can be done by wrapping in a cast cover or garbage back (or similar)  Do Not stick any thing down your splint or cast such as pencils, money, or hangers to try and scratch yourself with.  If you feel itchy take benadryl as prescribed on the bottle for itching  IF YOU ARE IN A CAM BOOT (BLACK BOOT)  You may remove boot periodically. Perform daily dressing changes as noted below.  Wash the liner of the boot regularly and wear a sock when wearing the boot. It is recommended that you sleep in the boot until told otherwise  CALL THE OFFICE WITH ANY QUESTIONS OR CONCERNS: [phone]

## 2016-06-30 NOTE — Anesthesia Postprocedure Evaluation (Signed)
Anesthesia Post Note  Patient: Aubrey Voong  Procedure(s) Performed: Procedure(s) (LRB): OPEN REDUCTION INTERNAL FIXATION (ORIF) ANKLE FRACTURE (Right)  Patient location during evaluation: PACU Anesthesia Type: General Level of consciousness: awake and alert, oriented and patient cooperative Pain management: pain level controlled Vital Signs Assessment: post-procedure vital signs reviewed and stable Respiratory status: spontaneous breathing, nonlabored ventilation and respiratory function stable Cardiovascular status: blood pressure returned to baseline and stable Postop Assessment: no signs of nausea or vomiting Anesthetic complications: no       Last Vitals:  Vitals:   06/30/16 1023 06/30/16 1032  BP: 130/66   Pulse: 73 83  Resp: 18 17  Temp:  36.1 C    Last Pain:  Vitals:   06/30/16 0425  TempSrc: Oral  PainSc:                  Nora Rooke,E. Amillia Biffle

## 2016-06-30 NOTE — Brief Op Note (Signed)
06/29/2016 - 06/30/2016  10:26 AM  PATIENT:  Glen Lee  83 y.o. male  PRE-OPERATIVE DIAGNOSIS:  Right Ankle Trimalleolar Fracture Subluxation  POST-OPERATIVE DIAGNOSIS:  Right Ankle Trimalleolar Fracture Subluxation  PROCEDURE:  Procedure(s): 1. OPEN REDUCTION INTERNAL FIXATION (ORIF) TRIMALLEOLAR ANKLE FRACTURE (Right) WITHOUT FIXATION OF POSTERIOR LIP 2. STRESS EVALUATION OF SYNDESMOSIS  SURGEON:  Surgeon(s) and Role:    * [place], MD - Primary  PHYSICIAN ASSISTANT: NONE  ASSISTANTS: none   ANESTHESIA:   general  EBL:  Total I/O In: 1100 [I.V.:1100] Out: 50 [Blood:50]  BLOOD ADMINISTERED:none  DRAINS: none   LOCAL MEDICATIONS USED:  NONE  SPECIMEN:  No Specimen  DISPOSITION OF SPECIMEN:  N/A  COUNTS:  YES  TOURNIQUET:  * No tourniquets in log *  DICTATION: .Other Dictation: Dictation Number [id number]  PLAN OF CARE: Admit to inpatient   PATIENT DISPOSITION:  PACU - hemodynamically stable.   Delay start of Pharmacological VTE agent (>24hrs) due to surgical blood loss or risk of bleeding: no

## 2016-06-30 NOTE — Transfer of Care (Signed)
Immediate Anesthesia Transfer of Care Note  Patient: Charlene Cowdrey  Procedure(s) Performed: Procedure(s): OPEN REDUCTION INTERNAL FIXATION (ORIF) ANKLE FRACTURE (Right)  Patient Location: PACU  Anesthesia Type:General  Level of Consciousness: awake, alert  and oriented  Airway & Oxygen Therapy: Patient Spontanous Breathing and Patient connected to nasal cannula oxygen  Post-op Assessment: Report given to RN and Post -op Vital signs reviewed and stable  Post vital signs: Reviewed and stable  Last Vitals:  Vitals:   06/30/16 0733 06/30/16 0734  BP:    Pulse: 62 72  Resp: 12   Temp:      Last Pain:  Vitals:   06/30/16 0425  TempSrc: Oral  PainSc:       Patients Stated Pain Goal: 0 ([id number])  Complications: No apparent anesthesia complications

## 2016-06-30 NOTE — Anesthesia Procedure Notes (Signed)
Procedure Name: Intubation Date/Time: 06/30/2016 7:55 AM Performed by: Candis Shine Pre-anesthesia Checklist: Patient identified, Emergency Drugs available, Suction available and Patient being monitored Patient Re-evaluated:Patient Re-evaluated prior to inductionOxygen Delivery Method: Circle System Utilized Preoxygenation: Pre-oxygenation with 100% oxygen Intubation Type: IV induction and Cricoid Pressure applied Ventilation: Mask ventilation without difficulty and Oral airway inserted - appropriate to patient size Laryngoscope Size: Mac and 4 Grade View: Grade I Tube type: Oral Tube size: 7.5 mm Number of attempts: 1 Airway Equipment and Method: Stylet and Oral airway Placement Confirmation: ETT inserted through vocal cords under direct vision,  positive ETCO2 and breath sounds checked- equal and bilateral Secured at: 23 cm Tube secured with: Tape Dental Injury: Teeth and Oropharynx as per pre-operative assessment

## 2016-07-01 ENCOUNTER — Inpatient Hospital Stay (HOSPITAL_COMMUNITY): Payer: Medicare PPO

## 2016-07-01 DIAGNOSIS — R0902 Hypoxemia: Secondary | ICD-10-CM | POA: Diagnosis present

## 2016-07-01 LAB — GLUCOSE, CAPILLARY
GLUCOSE-CAPILLARY: 128 mg/dL — AB (ref 65–99)
Glucose-Capillary: 139 mg/dL — ABNORMAL HIGH (ref 65–99)
Glucose-Capillary: 157 mg/dL — ABNORMAL HIGH (ref 65–99)
Glucose-Capillary: 141 mg/dL — ABNORMAL HIGH (ref 65–99)

## 2016-07-01 LAB — BASIC METABOLIC PANEL
Anion gap: 7 (ref 5–15)
BUN: 18 mg/dL (ref 6–20)
CALCIUM: 8.7 mg/dL — AB (ref 8.9–10.3)
CO2: 24 mmol/L (ref 22–32)
CREATININE: 1.47 mg/dL — AB (ref 0.61–1.24)
Chloride: 105 mmol/L (ref 101–111)
GFR calc non Af Amer: 43 mL/min — ABNORMAL LOW (ref >60)
GFR, EST AFRICAN AMERICAN: 50 mL/min — AB (ref >60)
Glucose, Bld: 125 mg/dL — ABNORMAL HIGH (ref 65–99)
Potassium: 4.2 mmol/L (ref 3.5–5.1)
SODIUM: 136 mmol/L (ref 135–145)

## 2016-07-01 MED ORDER — OXYCODONE HCL 5 MG PO TABS
5.0000 mg | ORAL_TABLET | ORAL | Status: DC | PRN
  Administered 2016-07-01 – 2016-07-03 (×5): 5 mg via ORAL
  Filled 2016-07-01 (×6): qty 1

## 2016-07-01 MED ORDER — SODIUM BICARBONATE/SODIUM CHLORIDE MOUTHWASH
OROMUCOSAL | Status: DC | PRN
  Administered 2016-07-01: 09:00:00 via OROMUCOSAL
  Filled 2016-07-01 (×2): qty 1000

## 2016-07-01 MED ORDER — TORSEMIDE 20 MG PO TABS
10.0000 mg | ORAL_TABLET | Freq: Every day | ORAL | Status: DC
  Administered 2016-07-01: 10 mg via ORAL
  Filled 2016-07-01: qty 1

## 2016-07-01 NOTE — Evaluation (Signed)
Physical Therapy Evaluation Patient Details Name: Glen Lee MRN: [id number] DOB: 01-27-1934 Today's Date: 07/01/2016   History of Present Illness  Pt is an 83 y.o. male s/p ORIF R ankle 1/19 after fall due to ice and resulting R ankle fracture. Pt has a PMH significant for  anemia, arthritis, atrial fibrillation, DM without complication, edema, esophageal reflux, hyperlipidemia, hypertension, thyroid nodule, tremor of R hand, and vitamin D deficiency.   Clinical Impression  Pt is POD 1 following the above procedure. Prior to admission, pt lived alone in a single level home and was mostly independent with use of an assistive device. Pt requires Max A +2 to perform bed mobility and transfers and requires max cues to reduce weight bearing through RLE with standing. Pt continues to require acute services to address transfers and bed mobs to assist transition to next venue of care.     Follow Up Recommendations SNF;Supervision/Assistance - 24 hour    Equipment Recommendations  None recommended by PT    Recommendations for Other Services       Precautions / Restrictions Precautions Precautions: Fall Precaution Comments: fall resulted in current hospitalization Restrictions Weight Bearing Restrictions: Yes RLE Weight Bearing: Non weight bearing      Mobility  Bed Mobility Overal bed mobility: Needs Assistance Bed Mobility: Supine to Sit     Supine to sit: Max assist;+2 for physical assistance     General bed mobility comments: Requries assistance to move LE's OOB and bring trunk upright at EOB  Transfers Overall transfer level: Needs assistance Equipment used: Rolling walker (2 wheeled) Transfers: Sit to/from [facility] Sit to Stand: Mod assist;+2 physical assistance;From elevated surface Stand pivot transfers: +2 physical assistance;Max assist;+2 safety/equipment       General transfer comment: Required multiple attempts for stand-pivot transfer but able  to complete with max assist +2. Pt requiring consistent VC's to prevent WB through R LE.  Ambulation/Gait                Stairs            Wheelchair Mobility    Modified Rankin (Stroke Patients Only)       Balance Overall balance assessment: Needs assistance Sitting-balance support: No upper extremity supported;Feet supported Sitting balance-Leahy Scale: Fair Sitting balance - Comments: Required support at times to maintain balance sitting without back support. Postural control: Left lateral lean Standing balance support: Bilateral upper extremity supported;During functional activity Standing balance-Leahy Scale: Poor Standing balance comment: Reliant on RW and therapists for support.                             Pertinent Vitals/Pain Pain Assessment: Faces Faces Pain Scale: Hurts whole lot Pain Location: R ankle Pain Descriptors / Indicators: Aching;Discomfort;Grimacing;Operative site guarding;Sore Pain Intervention(s): Limited activity within patient's tolerance;Monitored during session;Repositioned;Ice applied    Home Living Family/patient expects to be discharged to:: Skilled nursing facility Living Arrangements: Alone               Additional Comments: Pt planning to D/C to SNF for continued rehabilitation. Has walk-in shower and one story home with a ramp.    Prior Function Level of Independence: Independent with assistive device(s)         Comments: Using RW when needed but not all the time. Also uses AE kit for LB ADL.     Hand Dominance   Dominant Hand: Right    Extremity/Trunk Assessment  Upper Extremity Assessment Upper Extremity Assessment: Defer to OT evaluation RUE Deficits / Details: R UE tremor at baseline but is functional. AROM limited to approximately 120 degrees shoulder flexion. Elbow and wrist AROM WNL. Generalized weakness grossly. Highly decreased fine motor coordination. RUE Coordination: decreased fine  motor;decreased gross motor LUE Deficits / Details: Decreased shoulder AROM to approximately 100 degrees due to previous injury. Grossly generalized weakness but L strength greater than R. LUE Coordination: decreased fine motor    Lower Extremity Assessment Lower Extremity Assessment: RLE deficits/detail RLE Deficits / Details: pt with post op pain and weakness. At least 3/5 knee and hip but increased rigidity noted.  RLE: Unable to fully assess due to immobilization       Communication   Communication: No difficulties  Cognition Arousal/Alertness: Awake/alert Behavior During Therapy: WFL for tasks assessed/performed;Flat affect Overall Cognitive Status: Within Functional Limits for tasks assessed                      General Comments      Exercises Total Joint Exercises Ankle Circles/Pumps: AROM;20 reps;Supine;Right Quad Sets: AROM;Both;10 reps;Supine   Assessment/Plan    PT Assessment Patient needs continued PT services  PT Problem List Decreased strength;Decreased range of motion;Decreased balance;Decreased activity tolerance;Decreased mobility;Decreased knowledge of use of DME;Pain          PT Treatment Interventions DME instruction;Gait training;Stair training;Functional mobility training;Therapeutic activities;Therapeutic exercise;Balance training    PT Goals (Current goals can be found in the Care Plan section)  Acute Rehab PT Goals Patient Stated Goal: to get rehab before going home PT Goal Formulation: With patient Time For Goal Achievement: 07/08/16 Potential to Achieve Goals: Good    Frequency Min 3X/week   Barriers to discharge        Co-evaluation PT/OT/SLP Co-Evaluation/Treatment: Yes Reason for Co-Treatment: For patient/therapist safety;To address functional/ADL transfers PT goals addressed during session: Mobility/safety with mobility OT goals addressed during session: ADL's and self-care       End of Session Equipment Utilized During  Treatment: Gait belt Activity Tolerance: Patient limited by pain Patient left: in chair;with call bell/phone within reach Nurse Communication: Mobility status;Patient requests pain meds         Time: 1457-1534 PT Time Calculation (min) (ACUTE ONLY): 37 min   Charges:   PT Evaluation $PT Eval Moderate Complexity: 1 Procedure     PT G Codes:        Scheryl Marten PT, DPT  [phone]  07/01/2016, 6:39 PM

## 2016-07-01 NOTE — Evaluation (Signed)
Occupational Therapy Evaluation Patient Details Name: Glen Lee MRN: [id number] DOB: 07/16/1933 Today's Date: 07/01/2016    History of Present Illness Pt is an 83 y.o. male s/p ORIF R ankle 1/19 after fall due to ice and resulting R ankle fracture. Pt has a PMH significant for  anemia, arthritis, atrial fibrillation, DM without complication, edema, esophageal reflux, hyperlipidemia, hypertension, thyroid nodule, tremor of R hand, and vitamin D deficiency.    Clinical Impression   PTA, pt was independent with assistive devices for ADL and functional mobility and was using a RW and adaptive equipment. Pt currently requires total assist for LB ADL at bed level, min assist for UB ADL seated at EOB, and max assist +2 for stand-pivot transfer. Pt lives alone and demonstrates significant functional decline from PLOF. Thus, pt would benefit from short-term SNF placement for continued rehabilitation services in order to improve independence with ADL and facilitate maximum return to PLOF. OT will continue to follow while admitted in order to improve independence with ADL and functional mobility in preparation for D/C to SNF.     Follow Up Recommendations  SNF;Supervision/Assistance - 24 hour    Equipment Recommendations  Other (comment) (TBD at next venue)    Recommendations for Other Services       Precautions / Restrictions Precautions Precautions: Fall Restrictions Weight Bearing Restrictions: Yes RLE Weight Bearing: Non weight bearing      Mobility Bed Mobility Overal bed mobility: Needs Assistance Bed Mobility: Supine to Sit     Supine to sit: Max assist;+2 for physical assistance        Transfers Overall transfer level: Needs assistance Equipment used: Rolling walker (2 wheeled) Transfers: Sit to/from [facility] Sit to Stand: +2 physical assistance;Mod assist Stand pivot transfers: +2 physical assistance;Max assist       General transfer comment:  Required multiple attempts for stand-pivot transfer but able to complete with max assist +2. Pt requiring consistent VC's to prevent WB through R LE.    Balance Overall balance assessment: Needs assistance Sitting-balance support: No upper extremity supported;Feet supported Sitting balance-Leahy Scale: Fair Sitting balance - Comments: Required support at times to maintain balance sitting without back support.   Standing balance support: Bilateral upper extremity supported;During functional activity Standing balance-Leahy Scale: Poor Standing balance comment: Reliant on RW and therapists for support.                            ADL Overall ADL's : Needs assistance/impaired Eating/Feeding: Set up;Bed level   Grooming: Minimal assistance;Sitting   Upper Body Bathing: Minimal assistance;Sitting   Lower Body Bathing: Total assistance;Bed level   Upper Body Dressing : Minimal assistance;Sitting   Lower Body Dressing: Total assistance;Bed level   Toilet Transfer: Maximal assistance;+2 for physical assistance;Stand-pivot;RW Toilet Transfer Details (indicate cue type and reason): Attempted x3 and successful on final attempt with max assist +2. Toileting- Clothing Manipulation and Hygiene: Maximal assistance;Bed level         General ADL Comments: Pt educated on role of OT in recovery process as well as benefits of participating in ADL. Pt able to tolerate sitting at EOB for UB dressing and grooming tasks with min assist to maintain balance.     Vision Vision Assessment?: No apparent visual deficits   Perception     Praxis      Pertinent Vitals/Pain Pain Assessment: Faces Faces Pain Scale: Hurts whole lot Pain Location: R ankle Pain Descriptors / Indicators: Aching;Discomfort;Grimacing;Operative  site guarding;Sore Pain Intervention(s): Limited activity within patient's tolerance;Monitored during session;Repositioned;Ice applied     Hand Dominance Right    Extremity/Trunk Assessment Upper Extremity Assessment Upper Extremity Assessment: RUE deficits/detail;LUE deficits/detail;Generalized weakness RUE Deficits / Details: R UE tremor at baseline but is functional. AROM limited to approximately 120 degrees shoulder flexion. Elbow and wrist AROM WNL. Generalized weakness grossly. Highly decreased fine motor coordination. RUE Coordination: decreased fine motor;decreased gross motor LUE Deficits / Details: Decreased shoulder AROM to approximately 100 degrees due to previous injury. Grossly generalized weakness but L strength greater than R. LUE Coordination: decreased fine motor   Lower Extremity Assessment Lower Extremity Assessment: Defer to PT evaluation       Communication Communication Communication: No difficulties   Cognition Arousal/Alertness: Awake/alert Behavior During Therapy: WFL for tasks assessed/performed Overall Cognitive Status: Within Functional Limits for tasks assessed                     General Comments       Exercises       Shoulder Instructions      Home Living Family/patient expects to be discharged to:: Skilled nursing facility Living Arrangements: Alone                               Additional Comments: Pt planning to D/C to SNF for continued rehabilitation. Has walk-in shower and one story home with a ramp.      Prior Functioning/Environment Level of Independence: Independent with assistive device(s)        Comments: Using RW when needed but not all the time. Also uses AE kit for LB ADL.        OT Problem List: Decreased strength;Decreased range of motion;Decreased activity tolerance;Impaired balance (sitting and/or standing);Decreased safety awareness;Decreased knowledge of use of DME or AE;Decreased knowledge of precautions;Pain   OT Treatment/Interventions: Self-care/ADL training;Therapeutic exercise;Therapeutic activities;Patient/family education;Balance training;Energy  conservation;DME and/or AE instruction    OT Goals(Current goals can be found in the care plan section) Acute Rehab OT Goals Patient Stated Goal: to get rehab before going home OT Goal Formulation: With patient Time For Goal Achievement: 07/08/16 Potential to Achieve Goals: Good ADL Goals Pt Will Perform Grooming: with min guard assist;standing Pt Will Perform Lower Body Bathing: with min guard assist;sit to/from stand;with adaptive equipment Pt Will Perform Lower Body Dressing: with min guard assist;with adaptive equipment;sit to/from stand Pt Will Transfer to Toilet: with min guard assist;ambulating;bedside commode (BSC over toilet) Pt Will Perform Toileting - Clothing Manipulation and hygiene: with min guard assist;sit to/from stand Pt Will Perform Tub/Shower Transfer: Shower transfer;ambulating;with min assist;rolling walker;shower seat Pt/caregiver will Perform Home Exercise Program: Both right and left upper extremity;Increased strength;Increased ROM;With written HEP provided  OT Frequency: Min 1X/week   Barriers to D/C:            Co-evaluation PT/OT/SLP Co-Evaluation/Treatment: Yes Reason for Co-Treatment: For patient/therapist safety;To address functional/ADL transfers   OT goals addressed during session: ADL's and self-care      End of Session Equipment Utilized During Treatment: Gait belt;Rolling walker Nurse Communication: Mobility status;Patient requests pain meds (IV needs attention)  Activity Tolerance: Patient tolerated treatment well Patient left: in chair;with call bell/phone within reach;with nursing/sitter in room   Time: 1457-1533 OT Time Calculation (min): 36 min Charges:  OT General Charges $OT Visit: 1 Procedure OT Evaluation $OT Eval Moderate Complexity: 1 Procedure G-CodesNorman Herrlich, OTR/L [phone] 07/01/2016,  5:30 PM

## 2016-07-01 NOTE — Progress Notes (Signed)
Subjective: 1 Day Post-Op Procedure(s) (LRB): OPEN REDUCTION INTERNAL FIXATION (ORIF) ANKLE FRACTURE (Right) Patient reports pain as 7 on 0-10 scale.    Objective: Vital signs in last 24 hours: Temp:  [98.4 F (36.9 C)-99.6 F (37.6 C)] 98.4 F (36.9 C) (01/20 1105) Pulse Rate:  [63-75] 75 (01/20 1105) Resp:  [16] 16 (01/20 1105) BP: (128-143)/(54-63) 128/60 (01/20 1105) SpO2:  [93 %-94 %] 94 % (01/20 1105)  Intake/Output from previous day: 01/19 0701 - 01/20 0700 In: 1100 [I.V.:1100] Out: 475 [Urine:425; Blood:50] Intake/Output this shift: Total I/O In: 240 [P.O.:240] Out: -    Recent Labs  06/29/16 1451 06/29/16 1504 06/29/16 2137 06/30/16 0513  HGB 10.8* 10.2* 10.0* 9.7*    Recent Labs  06/29/16 2137 06/30/16 0513  WBC 7.5 6.0  RBC 3.51* 3.40*  HCT 30.6* 29.9*  PLT 130* 133*    Recent Labs  06/30/16 0513 07/01/16 0340  NA 140 136  K 3.9 4.2  CL 108 105  CO2 26 24  BUN 20 18  CREATININE 1.53* 1.47*  GLUCOSE 137* 125*  CALCIUM 9.2 8.7*   No results for input(s): LABPT, INR in the last 72 hours.  ABD soft Neurovascular intact Sensation intact distally Dorsiflexion/Plantar flexion intact Incision: dressing C/D/I  Assessment/Plan: 1 Day Post-Op Procedure(s) (LRB): OPEN REDUCTION INTERNAL FIXATION (ORIF) ANKLE FRACTURE (Right)  Active Problems:   Ankle fracture   Hypoxia  Advance diet Up with therapy  Mackensie Pilson J 07/01/2016, 2:42 PM

## 2016-07-01 NOTE — Progress Notes (Signed)
Patient ID: Glen Lee, male   DOB: 16-Nov-1933, 83 y.o.   MRN: [id number]                                                                PROGRESS NOTE                                                                                                                                                                                                             Patient Demographics:    Gurinder Toral, is a 83 y.o. male, DOB - 09-27-1933, JSE:[id number]  Admit date - 06/29/2016   Admitting Physician Mauricio Gerome Apley, MD  Outpatient Primary MD for the patient is Daphene Jaeger, PA-C  LOS - 2  Outpatient Specialists:    Chief Complaint  Patient presents with  . Fall  . Ankle Injury       Brief Narrative  83 y.o. male with medical history significant of ambulatory dysfunction, who presents to the hospital after staying mechanical fall. This morning when he was walking through his dry weight which was covered with ice he fell sustaining injury to his right ankle. After the fall he developed severe pain of the right ankle, sharp in nature, 10 out of 10 intensity, worse with movement, no improving factors, he was unable to stand back on his feet. He was brought into the hospital for further evaluation.  He normally uses a walker for ambulation. He does have neuropathy, with tremors on his right upper extremity.  ED Course: Noted complex fracture to his right ankle, unsafe for home discharge due to increased fall risk. Consulted orthopedics for further intervention.   Subjective:    Jehan Bonano underwent ORIF of the R ankle 1/19 appears to be doing well.    No headache, No chest pain, No abdominal pain - No Nausea, No new weakness tingling or numbness, No Cough - SOB.    Assessment  & Plan :    Active Problems:   Ankle fracture   Right ankle fracture. Underwent ORIF 1/19 Cont pain control. Monitor, pt/ot   Low pox yesterday/hypoxia Incentive spirometry CXR  H/o atrial  fibrillation.Chronic and stable.  Continue diltiazem 120 mg daily. Patient not on any anticoagulation due to bleeding, recurrent falls. patient euvolemic, restart on torsemide Check cbc, cmp in am  Type 2 diabetes mellitus.monitor on ISS  BPH. Continue tamsulosin and finasteride.   Chronic kidney disease. Stage III.Marland Kitchen Base creatinine around 2.0, calculated GFR about 30.   Code Status : FULL CODE  Family Communication  :  w patient  Disposition Plan  : pending PT  Barriers For Discharge :   Consults  :  orthopedics  Procedures  : ORIF R ankle fracture 1/19  DVT Prophylaxis  :  Lovenox -  SCDs   Lab Results  Component Value Date   PLT 133 (L) 06/30/2016    Antibiotics  :     Anti-infectives    Start     Dose/Rate Route Frequency Ordered Stop   06/30/16 1600  clindamycin (CLEOCIN) IVPB 600 mg     600 mg 100 mL/hr over 30 Minutes Intravenous Every 6 hours 06/30/16 1056 07/01/16 0959   06/30/16 0600  clindamycin (CLEOCIN) IVPB 900 mg     900 mg 100 mL/hr over 30 Minutes Intravenous On call to O.R. 06/29/16 2054 06/30/16 0830        Objective:   Vitals:   06/30/16 1032 06/30/16 2051 07/01/16 0000 07/01/16 0444  BP:  (!) 143/63 (!) 141/54 (!) 141/56  Pulse: 83 70 72 63  Resp: 17 16 16 16   Temp: 97 F (36.1 C) 99.6 F (37.6 C) 98.8 F (37.1 C) 98.8 F (37.1 C)  TempSrc:  Oral Oral Oral  SpO2: (!) 89% 94% 94% 93%  Weight:      Height:        Wt Readings from Last 3 Encounters:  06/29/16 97.5 kg (215 lb)  08/17/15 99.8 kg (220 lb)  05/14/15 94.1 kg (207 lb 6.4 oz)     Intake/Output Summary (Last 24 hours) at 07/01/16 0545 Last data filed at 07/01/16 0449  Gross per 24 hour  Intake             1100 ml  Output              475 ml  Net              625 ml     Physical Exam  Awake Alert, Oriented X 3, No new F.N deficits, Normal affect [place].AT,PERRAL Supple Neck,No JVD, No cervical lymphadenopathy appriciated.  Symmetrical Chest wall movement, Good  air movement bilaterally, CTAB RRR,No Gallops,Rubs or new Murmurs, No Parasternal Heave +ve B.Sounds, Abd Soft, No tenderness, No organomegaly appriciated, No rebound - guarding or rigidity. No Cyanosis, Clubbing or edema, No new Rash or bruise   Slight resting tremor right hand   Data Review:    CBC  Recent Labs Lab 06/29/16 1451 06/29/16 1504 06/29/16 2137 06/30/16 0513  WBC 7.5  --  7.5 6.0  HGB 10.8* 10.2* 10.0* 9.7*  HCT 32.5* 30.0* 30.6* 29.9*  PLT 139*  --  130* 133*  MCV 86.7  --  87.2 87.9  MCH 28.8  --  28.5 28.5  MCHC 33.2  --  32.7 32.4  RDW 14.3  --  14.6 14.7  LYMPHSABS 0.8  --   --   --   MONOABS 0.5  --   --   --   EOSABS 0.0  --   --   --   BASOSABS 0.0  --   --   --     Chemistries   Recent Labs Lab 06/29/16 1504 06/29/16 2137 06/30/16 0513  NA 142  --  140  K 4.2  --  3.9  CL  105  --  108  CO2  --   --  26  GLUCOSE 155*  --  137*  BUN 25*  --  20  CREATININE 1.60* 1.52* 1.53*  CALCIUM  --   --  9.2  AST  --   --  16  ALT  --   --  13*  ALKPHOS  --   --  89  BILITOT  --   --  0.8   ------------------------------------------------------------------------------------------------------------------ No results for input(s): CHOL, HDL, LDLCALC, TRIG, CHOLHDL, LDLDIRECT in the last 72 hours.  Lab Results  Component Value Date   HGBA1C 6.6 (H) 08/05/2014   ------------------------------------------------------------------------------------------------------------------ No results for input(s): TSH, T4TOTAL, T3FREE, THYROIDAB in the last 72 hours.  Invalid input(s): FREET3 ------------------------------------------------------------------------------------------------------------------ No results for input(s): VITAMINB12, FOLATE, FERRITIN, TIBC, IRON, RETICCTPCT in the last 72 hours.  Coagulation profile No results for input(s): INR, PROTIME in the last 168 hours.  No results for input(s): DDIMER in the last 72 hours.  Cardiac Enzymes No  results for input(s): CKMB, TROPONINI, MYOGLOBIN in the last 168 hours.  Invalid input(s): CK ------------------------------------------------------------------------------------------------------------------    Component Value Date/Time   BNP 239.0 (H) 08/04/2014 2058    Inpatient Medications  Scheduled Meds: . cholecalciferol  3,000 Units Oral Daily  . clindamycin (CLEOCIN) IV  600 mg Intravenous Q6H  . diltiazem  120 mg Oral Daily  . docusate sodium  100 mg Oral BID  . enoxaparin (LOVENOX) injection  40 mg Subcutaneous Q24H  . ferrous sulfate  325 mg Oral Q breakfast  . finasteride  5 mg Oral Daily  . insulin aspart  0-9 Units Subcutaneous TID WC  . Mesalamine  800 mg Oral TID  . pantoprazole  40 mg Oral Daily  . polyethylene glycol  17 g Oral Daily  . simvastatin  40 mg Oral QHS  . sodium chloride flush  3 mL Intravenous Q12H  . tamsulosin  0.4 mg Oral Daily   Continuous Infusions: PRN Meds:.sodium chloride, acetaminophen **OR** acetaminophen, metoCLOPramide **OR** metoCLOPramide (REGLAN) injection, morphine injection, ondansetron **OR** ondansetron (ZOFRAN) IV, sodium chloride flush  Micro Results Recent Results (from the past 240 hour(s))  Surgical PCR screen     Status: None   Collection Time: 06/29/16 10:50 PM  Result Value Ref Range Status   MRSA, PCR NEGATIVE NEGATIVE Final   Staphylococcus aureus NEGATIVE NEGATIVE Final    Comment:        The Xpert SA Assay (FDA approved for NASAL specimens in patients over 12 years of age), is one component of a comprehensive surveillance program.  Test performance has been validated by [facility] for patients greater than or equal to 48 year old. It is not intended to diagnose infection nor to guide or monitor treatment.     Radiology Reports Dg Ankle Complete Right  Result Date: 06/30/2016 CLINICAL DATA:  Status post ankle fracture. EXAM: RIGHT ANKLE - COMPLETE 3+ VIEW COMPARISON:  06/30/2016 FINDINGS: Exam detail  is diminished secondary to overlying casting material. Sideplate and screw device is identified reducing the distal fibular fracture. Two screws reduces the medial malleolar fracture. The hardware components and fracture fragments are in anatomic alignment. IMPRESSION: 1. Status post ORIF of fractures involving the distal fibula and medial malleolus. Electronically Signed   By: Kerby Moors M.D.   On: 06/30/2016 11:27   Dg Ankle Complete Right  Result Date: 06/30/2016 CLINICAL DATA:  Intraoperative imaging for fixation of right ankle fracture suffered in a slip and fall 06/29/2016.  Initial encounter. EXAM: RIGHT ANKLE - COMPLETE 3+ VIEW; DG C-ARM 61-120 MIN COMPARISON:  Plain films of the right ankle 06/29/2016. FINDINGS: We are provided with 5 fluoroscopic spot views of the right ankle. Images demonstrate 2 screws in the medial malleolus and lateral plate and screws for fixation of distal tibial and fibular fractures. Hardware is intact. Position and alignment appear anatomic. No acute finding. IMPRESSION: ORIF right ankle fractures as described.  No acute abnormality. Electronically Signed   By: Inge Rise M.D.   On: 06/30/2016 09:35   Dg Ankle Complete Right  Result Date: 06/29/2016 CLINICAL DATA:  Right ankle pain after slip and fall.  Swelling. EXAM: RIGHT ANKLE - COMPLETE 3+ VIEW COMPARISON:  None. FINDINGS: Weber B fracture pattern of the ankle with oblique lateral malleolar fracture, transverse medial malleolar fracture, and questionable posterior malleolar fracture. The tibial shaft is subluxed about 10 mm medially with respect to the talus. Surrounding soft tissue swelling noted. Plantar calcaneal spur.  Vascular calcifications noted. IMPRESSION: 1. Weber B fracture pattern with fractures of the lateral and medial malleolus and probable fracture of the posterior malleolus given the medial subluxation of the tibia with respect to the talus. Unstable fracture. Electronically Signed   By:  Van Clines M.D.   On: 06/29/2016 13:57   Ct Head Wo Contrast  Result Date: 06/29/2016 CLINICAL DATA:  Patient slipped and fell this afternoon. EXAM: CT HEAD WITHOUT CONTRAST TECHNIQUE: Contiguous axial images were obtained from the base of the skull through the vertex without intravenous contrast. COMPARISON:  None. FINDINGS: Brain: Mild superficial and central atrophy with chronic appearing small vessel ischemic disease of periventricular white matter. No acute intracranial hemorrhage, mass or midline shift. Basal cisterns are midline without effacement. Vascular: Moderate calcification of the carotid siphons bilaterally. No hyperdense vasculature. Skull: Nonacute. Sinuses/Orbits: Mucous retention cyst partially imaged in the right maxillary sinus. Mastoids are clear. Orbits are intact with bilateral lens replacement surgical change. Other: None IMPRESSION: No acute intracranial abnormality. Chronic appearing small vessel ischemic disease of periventricular white matter with cerebral atrophy. Electronically Signed   By: Ashley Royalty M.D.   On: 06/29/2016 14:05   Dg C-arm 61-120 Min  Result Date: 06/30/2016 CLINICAL DATA:  Intraoperative imaging for fixation of right ankle fracture suffered in a slip and fall 06/29/2016. Initial encounter. EXAM: RIGHT ANKLE - COMPLETE 3+ VIEW; DG C-ARM 61-120 MIN COMPARISON:  Plain films of the right ankle 06/29/2016. FINDINGS: We are provided with 5 fluoroscopic spot views of the right ankle. Images demonstrate 2 screws in the medial malleolus and lateral plate and screws for fixation of distal tibial and fibular fractures. Hardware is intact. Position and alignment appear anatomic. No acute finding. IMPRESSION: ORIF right ankle fractures as described.  No acute abnormality. Electronically Signed   By: Inge Rise M.D.   On: 06/30/2016 09:35    Time Spent in minutes  30   Jani Gravel M.D on 07/01/2016 at 5:45 AM  Between 7am to 7pm - Pager -  [phone]  After 7pm go to [email or web address] - password [email or web address]  [facility] -  Office  [phone]

## 2016-07-01 NOTE — Progress Notes (Signed)
A copy of the [place] and Advanced Directive provided by family and placed in the patient's chart.  Family also brought updated list of medications, also placed on chart and updated.  Patient no longer takes Torsemide.

## 2016-07-02 DIAGNOSIS — S82401D Unspecified fracture of shaft of right fibula, subsequent encounter for closed fracture with routine healing: Secondary | ICD-10-CM

## 2016-07-02 DIAGNOSIS — S8254XD Nondisplaced fracture of medial malleolus of right tibia, subsequent encounter for closed fracture with routine healing: Secondary | ICD-10-CM

## 2016-07-02 LAB — COMPREHENSIVE METABOLIC PANEL
ALK PHOS: 77 U/L (ref 38–126)
ALT: 12 U/L — AB (ref 17–63)
AST: 14 U/L — AB (ref 15–41)
Albumin: 2.9 g/dL — ABNORMAL LOW (ref 3.5–5.0)
Anion gap: 8 (ref 5–15)
BILIRUBIN TOTAL: 1 mg/dL (ref 0.3–1.2)
BUN: 24 mg/dL — AB (ref 6–20)
CALCIUM: 8.8 mg/dL — AB (ref 8.9–10.3)
CO2: 23 mmol/L (ref 22–32)
CREATININE: 1.77 mg/dL — AB (ref 0.61–1.24)
Chloride: 105 mmol/L (ref 101–111)
GFR, EST AFRICAN AMERICAN: 40 mL/min — AB (ref >60)
GFR, EST NON AFRICAN AMERICAN: 35 mL/min — AB (ref >60)
Glucose, Bld: 131 mg/dL — ABNORMAL HIGH (ref 65–99)
Potassium: 4.2 mmol/L (ref 3.5–5.1)
Sodium: 136 mmol/L (ref 135–145)
TOTAL PROTEIN: 5.5 g/dL — AB (ref 6.5–8.1)

## 2016-07-02 LAB — CBC
HEMATOCRIT: 28.3 % — AB (ref 39.0–52.0)
HEMOGLOBIN: 9.3 g/dL — AB (ref 13.0–17.0)
MCH: 28.8 pg (ref 26.0–34.0)
MCHC: 32.9 g/dL (ref 30.0–36.0)
MCV: 87.6 fL (ref 78.0–100.0)
Platelets: 125 10*3/uL — ABNORMAL LOW (ref 150–400)
RBC: 3.23 MIL/uL — AB (ref 4.22–5.81)
RDW: 14.6 % (ref 11.5–15.5)
WBC: 5.9 10*3/uL (ref 4.0–10.5)

## 2016-07-02 LAB — GLUCOSE, CAPILLARY
GLUCOSE-CAPILLARY: 119 mg/dL — AB (ref 65–99)
GLUCOSE-CAPILLARY: 130 mg/dL — AB (ref 65–99)
Glucose-Capillary: 136 mg/dL — ABNORMAL HIGH (ref 65–99)
Glucose-Capillary: 132 mg/dL — ABNORMAL HIGH (ref 65–99)

## 2016-07-02 MED ORDER — ENOXAPARIN SODIUM 30 MG/0.3ML ~~LOC~~ SOLN
30.0000 mg | SUBCUTANEOUS | Status: DC
  Administered 2016-07-02: 30 mg via SUBCUTANEOUS
  Filled 2016-07-02: qty 0.3

## 2016-07-02 NOTE — NC FL2 (Signed)
[place] LEVEL OF CARE SCREENING TOOL     IDENTIFICATION  Patient Name: Glen Lee Birthdate: 21-Feb-1934 Sex: male Admission Date (Current Location): 06/29/2016  [facility] and [place] Number:  Herbalist and Address:  The . [facility] [address] [place], [place] [address]      Provider Number: [id number]  Attending Physician Name and Address:  Jani Gravel, MD  Relative Name and Phone Number:       Current Level of Care: Hospital Recommended Level of Care: [place] Prior Approval Number:    Date Approved/Denied: 07/15/14 PASRR Number:  [id number]   Discharge Plan: SNF    Current Diagnoses: Patient Active Problem List   Diagnosis Date Noted  . Hypoxia   . Ankle fracture 06/29/2016  . Leg edema, right 08/17/2015  . Varicose veins of lower extremities with complications 17/49/4496  . Chronic venous insufficiency 05/14/2015  . PAF (paroxysmal atrial fibrillation) ([place]) 09/16/2014  . Sepsis ([place]) 08/07/2014  . Persistent atrial fibrillation ([place])   . Acute bronchitis 08/04/2014  . Diabetes mellitus ([place]) 08/04/2014  . Hypotension 08/04/2014  . CKD (chronic kidney disease) stage 4, GFR 15-29 ml/min (HCC) 08/04/2014    Orientation RESPIRATION BLADDER Height & Weight     Place, Self, Situation  Normal Continent Weight: 215 lb (97.5 kg) Height:  6' (182.9 cm)  BEHAVIORAL SYMPTOMS/MOOD NEUROLOGICAL BOWEL NUTRITION STATUS      Continent  (Please see d/c summary)  AMBULATORY STATUS COMMUNICATION OF NEEDS Skin   Extensive Assist Verbally Surgical wounds (Closed incision right leg, compression wrap)                       Personal Care Assistance Level of Assistance  Bathing, Feeding, Dressing Bathing Assistance: Maximum assistance Feeding assistance: Limited assistance Dressing Assistance: Maximum assistance     Functional Limitations Info  Sight, Hearing, Speech Sight Info: Adequate Hearing  Info: Impaired Speech Info: Adequate    SPECIAL CARE FACTORS FREQUENCY  PT (By licensed PT), OT (By licensed OT)     PT Frequency: 3x week OT Frequency: 3x week            Contractures Contractures Info: Not present    Additional Factors Info  Code Status, Allergies Code Status Info: Full Allergies Info: Keflet (Cephalexin)           Current Medications (07/02/2016):  This is the current hospital active medication list Current Facility-Administered Medications  Medication Dose Route Frequency Provider Last Rate Last Dose  . 0.9 %  sodium chloride infusion  250 mL Intravenous PRN Tawni Millers, MD      . acetaminophen (TYLENOL) tablet 650 mg  650 mg Oral Q6H PRN Tawni Millers, MD   650 mg at 07/02/16 0515   Or  . acetaminophen (TYLENOL) suppository 650 mg  650 mg Rectal Q6H PRN Tawni Millers, MD      . cholecalciferol (VITAMIN D) tablet 3,000 Units  3,000 Units Oral Daily Mauricio Gerome Apley, MD   3,000 Units at 07/01/16 1107  . diltiazem (CARDIZEM CD) 24 hr capsule 120 mg  120 mg Oral Daily Tawni Millers, MD   120 mg at 07/01/16 1109  . docusate sodium (COLACE) capsule 100 mg  100 mg Oral BID Ainsley Spinner, PA-C   100 mg at 07/01/16 2058  . enoxaparin (LOVENOX) injection 30 mg  30 mg Subcutaneous Q24H Jani Gravel, MD      . ferrous  sulfate tablet 325 mg  325 mg Oral Q breakfast Tawni Millers, MD   325 mg at 07/01/16 0848  . finasteride (PROSCAR) tablet 5 mg  5 mg Oral Daily Mauricio Gerome Apley, MD   5 mg at 07/01/16 1107  . insulin aspart (novoLOG) injection 0-9 Units  0-9 Units Subcutaneous TID WC Tawni Millers, MD   1 Units at 07/01/16 1815  . Mesalamine (ASACOL) DR capsule 800 mg  800 mg Oral TID Tawni Millers, MD   800 mg at 07/01/16 2059  . metoCLOPramide (REGLAN) tablet 5-10 mg  5-10 mg Oral Q8H PRN Ainsley Spinner, PA-C       Or  . metoCLOPramide (REGLAN) injection 5-10 mg  5-10 mg Intravenous Q8H PRN Ainsley Spinner, PA-C      . morphine 2 MG/ML injection 1 mg  1 mg Intravenous Q4H PRN Tawni Millers, MD   1 mg at 06/30/16 2243  . ondansetron (ZOFRAN) tablet 4 mg  4 mg Oral Q6H PRN Tawni Millers, MD       Or  . ondansetron [facility]) injection 4 mg  4 mg Intravenous Q6H PRN Tawni Millers, MD      . oxyCODONE (Oxy IR/ROXICODONE) immediate release tablet 5 mg  5 mg Oral Q4H PRN Jani Gravel, MD   5 mg at 07/02/16 0515  . pantoprazole (PROTONIX) EC tablet 40 mg  40 mg Oral Daily Mauricio Gerome Apley, MD   40 mg at 07/01/16 1107  . polyethylene glycol (MIRALAX / GLYCOLAX) packet 17 g  17 g Oral Daily Ainsley Spinner, PA-C   17 g at 07/01/16 1108  . simvastatin (ZOCOR) tablet 40 mg  40 mg Oral QHS Tawni Millers, MD   40 mg at 07/01/16 2059  . sodium bicarbonate/sodium chloride mouthwash 1056m   Mouth Rinse PRN JJani Gravel MD      . sodium chloride flush (NS) 0.9 % injection 3 mL  3 mL Intravenous Q12H MTawni Millers MD   3 mL at 07/01/16 2059  . sodium chloride flush (NS) 0.9 % injection 3 mL  3 mL Intravenous PRN MTawni Millers MD      . tamsulosin ([facility] capsule 0.4 mg  0.4 mg Oral Daily Mauricio DGerome Apley MD   0.4 mg at 07/01/16 1107     Discharge Medications: Please see discharge summary for a list of discharge medications.  Relevant Imaging Results:  Relevant Lab Results:   Additional Information SSN: [id number] BAlla German LCSW

## 2016-07-02 NOTE — Progress Notes (Signed)
Patient ID: Glen Lee, male   DOB: 1934-04-02, 83 y.o.   MRN: [id number]                                                                PROGRESS NOTE                                                                                                                                                                                                             Patient Demographics:    Glen Lee, is a 83 y.o. male, DOB - Jan 30, 1934, PVX:[id number]  Admit date - 06/29/2016   Admitting Physician Mauricio Gerome Apley, MD  Outpatient Primary MD for the patient is HOUT, Marye Round, PA-C  LOS - 3  Outpatient Specialists:    Chief Complaint  Patient presents with  . Fall  . Ankle Injury       Brief Narrative 82 y.o.malewith medical history significant of ambulatory dysfunction, who presents to the hospital after staying mechanical fall. This morning when he was walking through his dry weight which was covered with ice he fell sustaining injury to his right ankle. After the fall he developed severe pain of the right ankle, sharp in nature, 10 out of 10 intensity, worse with movement, no improving factors, he was unable to stand back on his feet. He was brought into the hospital for further evaluation.  He normally uses a walker for ambulation. He does have neuropathy, with tremors on his right upper extremity.  ED Course:Noted complex fracture to his right ankle, unsafe for home discharge due to increased fall risk. Consultedorthopedics for further intervention.   Subjective:    Glen Lee underwent ORIF of the R ankle 1/19 appears to be doing well.  Pt states doesn't take torsemide at home as was on initial med list. This has been stopped. Pt would like to go to [facility]    No headache, No chest pain, No abdominal pain - No Nausea, No new weakness tingling or numbness, No Cough - SOB.    Assessment  & Plan :    Active Problems:   Ankle fracture   Hypoxia   Right ankle fracture.  Underwent ORIF 1/19 Cont pain control. Monitor, PT reordered, pt needs to have PT evaluation so can go to SNF Pt prefers [facility] if possible Social  work is working on this.   Low pox yesterday/hypoxia resolved Incentive spirometry CXR=> negative  H/o atrial fibrillation.Chronic and stable.  Continue diltiazem 120 mg daily. Patient not on any anticoagulation due to bleeding, recurrent falls. patient euvolemic,  Check cbc, cmp in am  Type 2 diabetes mellitus.monitor on ISS  BPH. Continue tamsulosin and finasteride.   Chronic kidney disease. Stage III.[place] creatinine around 2.0, calculated GFR about 30.   Code Status : FULL CODE  Family Communication  :  w patient  Disposition Plan  : pending PT  Barriers For Discharge :   Consults  :  orthopedics  Procedures  : ORIF R ankle fracture 1/19  DVT Prophylaxis  :  Lovenox -  SCDs     Lab Results  Component Value Date   PLT 125 (L) 07/02/2016    Antibiotics  :    Anti-infectives    Start     Dose/Rate Route Frequency Ordered Stop   06/30/16 1600  clindamycin (CLEOCIN) IVPB 600 mg     600 mg 100 mL/hr over 30 Minutes Intravenous Every 6 hours 06/30/16 1056 07/01/16 0918   06/30/16 0600  clindamycin (CLEOCIN) IVPB 900 mg     900 mg 100 mL/hr over 30 Minutes Intravenous On call to O.R. 06/29/16 2054 06/30/16 0830        Objective:   Vitals:   07/01/16 1105 07/01/16 1500 07/01/16 2018 07/02/16 0430  BP: 128/60 128/65 (!) 144/56 (!) 148/53  Pulse: 75 70 61 60  Resp: 16 16 16 18   Temp: 98.4 F (36.9 C) 98.8 F (37.1 C) 98.2 F (36.8 C) 98.2 F (36.8 C)  TempSrc: Oral Oral Oral Oral  SpO2: 94% 93% 96% 92%  Weight:      Height:        Wt Readings from Last 3 Encounters:  06/29/16 97.5 kg (215 lb)  08/17/15 99.8 kg (220 lb)  05/14/15 94.1 kg (207 lb 6.4 oz)     Intake/Output Summary (Last 24 hours) at 07/02/16 0946 Last data filed at 07/02/16 0830  Gross per 24 hour  Intake              960  ml  Output             2000 ml  Net            -1040 ml     Physical Exam  Awake Alert, Oriented X 3, No new F.N deficits, Normal affect [place].AT,PERRAL Supple Neck,No JVD, No cervical lymphadenopathy appriciated.  Symmetrical Chest wall movement, Good air movement bilaterally, CTAB RRR,No Gallops,Rubs or new Murmurs, No Parasternal Heave +ve B.Sounds, Abd Soft, No tenderness, No organomegaly appriciated, No rebound - guarding or rigidity. No Cyanosis, Clubbing or edema, No new Rash or bruise   Condom catheter in place,  Slight resting tremor of the right hand    Data Review:    CBC  Recent Labs Lab 06/29/16 1451 06/29/16 1504 06/29/16 2137 06/30/16 0513 07/02/16 0534  WBC 7.5  --  7.5 6.0 5.9  HGB 10.8* 10.2* 10.0* 9.7* 9.3*  HCT 32.5* 30.0* 30.6* 29.9* 28.3*  PLT 139*  --  130* 133* 125*  MCV 86.7  --  87.2 87.9 87.6  MCH 28.8  --  28.5 28.5 28.8  MCHC 33.2  --  32.7 32.4 32.9  RDW 14.3  --  14.6 14.7 14.6  LYMPHSABS 0.8  --   --   --   --   MONOABS 0.5  --   --   --   --  EOSABS 0.0  --   --   --   --   BASOSABS 0.0  --   --   --   --     Chemistries   Recent Labs Lab 06/29/16 1504 06/29/16 2137 06/30/16 0513 07/01/16 0340 07/02/16 0534  NA 142  --  140 136 136  K 4.2  --  3.9 4.2 4.2  CL 105  --  108 105 105  CO2  --   --  26 24 23   GLUCOSE 155*  --  137* 125* 131*  BUN 25*  --  20 18 24*  CREATININE 1.60* 1.52* 1.53* 1.47* 1.77*  CALCIUM  --   --  9.2 8.7* 8.8*  AST  --   --  16  --  14*  ALT  --   --  13*  --  12*  ALKPHOS  --   --  89  --  77  BILITOT  --   --  0.8  --  1.0   ------------------------------------------------------------------------------------------------------------------ No results for input(s): CHOL, HDL, LDLCALC, TRIG, CHOLHDL, LDLDIRECT in the last 72 hours.  Lab Results  Component Value Date   HGBA1C 6.6 (H) 08/05/2014    ------------------------------------------------------------------------------------------------------------------ No results for input(s): TSH, T4TOTAL, T3FREE, THYROIDAB in the last 72 hours.  Invalid input(s): FREET3 ------------------------------------------------------------------------------------------------------------------ No results for input(s): VITAMINB12, FOLATE, FERRITIN, TIBC, IRON, RETICCTPCT in the last 72 hours.  Coagulation profile No results for input(s): INR, PROTIME in the last 168 hours.  No results for input(s): DDIMER in the last 72 hours.  Cardiac Enzymes No results for input(s): CKMB, TROPONINI, MYOGLOBIN in the last 168 hours.  Invalid input(s): CK ------------------------------------------------------------------------------------------------------------------    Component Value Date/Time   BNP 239.0 (H) 08/04/2014 2058    Inpatient Medications  Scheduled Meds: . cholecalciferol  3,000 Units Oral Daily  . diltiazem  120 mg Oral Daily  . docusate sodium  100 mg Oral BID  . enoxaparin (LOVENOX) injection  40 mg Subcutaneous Q24H  . ferrous sulfate  325 mg Oral Q breakfast  . finasteride  5 mg Oral Daily  . insulin aspart  0-9 Units Subcutaneous TID WC  . Mesalamine  800 mg Oral TID  . pantoprazole  40 mg Oral Daily  . polyethylene glycol  17 g Oral Daily  . simvastatin  40 mg Oral QHS  . sodium chloride flush  3 mL Intravenous Q12H  . tamsulosin  0.4 mg Oral Daily   Continuous Infusions: PRN Meds:.sodium chloride, acetaminophen **OR** acetaminophen, metoCLOPramide **OR** metoCLOPramide (REGLAN) injection, morphine injection, ondansetron **OR** ondansetron (ZOFRAN) IV, oxyCODONE, sodium bicarbonate/sodium chloride, sodium chloride flush  Micro Results Recent Results (from the past 240 hour(s))  Surgical PCR screen     Status: None   Collection Time: 06/29/16 10:50 PM  Result Value Ref Range Status   MRSA, PCR NEGATIVE NEGATIVE Final    Staphylococcus aureus NEGATIVE NEGATIVE Final    Comment:        The Xpert SA Assay (FDA approved for NASAL specimens in patients over 48 years of age), is one component of a comprehensive surveillance program.  Test performance has been validated by [facility] for patients greater than or equal to 83 year old. It is not intended to diagnose infection nor to guide or monitor treatment.     Radiology Reports Dg Ankle Complete Right  Result Date: 06/30/2016 CLINICAL DATA:  Status post ankle fracture. EXAM: RIGHT ANKLE - COMPLETE 3+ VIEW COMPARISON:  06/30/2016 FINDINGS: Exam detail is  diminished secondary to overlying casting material. Sideplate and screw device is identified reducing the distal fibular fracture. Two screws reduces the medial malleolar fracture. The hardware components and fracture fragments are in anatomic alignment. IMPRESSION: 1. Status post ORIF of fractures involving the distal fibula and medial malleolus. Electronically Signed   By: Kerby Moors M.D.   On: 06/30/2016 11:27   Dg Ankle Complete Right  Result Date: 06/30/2016 CLINICAL DATA:  Intraoperative imaging for fixation of right ankle fracture suffered in a slip and fall 06/29/2016. Initial encounter. EXAM: RIGHT ANKLE - COMPLETE 3+ VIEW; DG C-ARM 61-120 MIN COMPARISON:  Plain films of the right ankle 06/29/2016. FINDINGS: We are provided with 5 fluoroscopic spot views of the right ankle. Images demonstrate 2 screws in the medial malleolus and lateral plate and screws for fixation of distal tibial and fibular fractures. Hardware is intact. Position and alignment appear anatomic. No acute finding. IMPRESSION: ORIF right ankle fractures as described.  No acute abnormality. Electronically Signed   By: Inge Rise M.D.   On: 06/30/2016 09:35   Dg Ankle Complete Right  Result Date: 06/29/2016 CLINICAL DATA:  Right ankle pain after slip and fall.  Swelling. EXAM: RIGHT ANKLE - COMPLETE 3+ VIEW COMPARISON:  None.  FINDINGS: Weber B fracture pattern of the ankle with oblique lateral malleolar fracture, transverse medial malleolar fracture, and questionable posterior malleolar fracture. The tibial shaft is subluxed about 10 mm medially with respect to the talus. Surrounding soft tissue swelling noted. Plantar calcaneal spur.  Vascular calcifications noted. IMPRESSION: 1. Weber B fracture pattern with fractures of the lateral and medial malleolus and probable fracture of the posterior malleolus given the medial subluxation of the tibia with respect to the talus. Unstable fracture. Electronically Signed   By: Van Clines M.D.   On: 06/29/2016 13:57   Ct Head Wo Contrast  Result Date: 06/29/2016 CLINICAL DATA:  Patient slipped and fell this afternoon. EXAM: CT HEAD WITHOUT CONTRAST TECHNIQUE: Contiguous axial images were obtained from the base of the skull through the vertex without intravenous contrast. COMPARISON:  None. FINDINGS: Brain: Mild superficial and central atrophy with chronic appearing small vessel ischemic disease of periventricular white matter. No acute intracranial hemorrhage, mass or midline shift. Basal cisterns are midline without effacement. Vascular: Moderate calcification of the carotid siphons bilaterally. No hyperdense vasculature. Skull: Nonacute. Sinuses/Orbits: Mucous retention cyst partially imaged in the right maxillary sinus. Mastoids are clear. Orbits are intact with bilateral lens replacement surgical change. Other: None IMPRESSION: No acute intracranial abnormality. Chronic appearing small vessel ischemic disease of periventricular white matter with cerebral atrophy. Electronically Signed   By: Ashley Royalty M.D.   On: 06/29/2016 14:05   Dg Chest Port 1 View  Result Date: 07/01/2016 CLINICAL DATA:  Hypoxia EXAM: PORTABLE CHEST 1 VIEW COMPARISON:  08/05/2014 FINDINGS: Heart size is normal. The aorta is unfolded. There is left lower lobe atelectasis/pneumonia. The remainder the chest is  clear. The vascularity is normal. No effusions. IMPRESSION: Left lower lobe atelectasis/ pneumonia. Electronically Signed   By: Nelson Chimes M.D.   On: 07/01/2016 08:28   Dg C-arm 61-120 Min  Result Date: 06/30/2016 CLINICAL DATA:  Intraoperative imaging for fixation of right ankle fracture suffered in a slip and fall 06/29/2016. Initial encounter. EXAM: RIGHT ANKLE - COMPLETE 3+ VIEW; DG C-ARM 61-120 MIN COMPARISON:  Plain films of the right ankle 06/29/2016. FINDINGS: We are provided with 5 fluoroscopic spot views of the right ankle. Images demonstrate 2 screws in the medial  malleolus and lateral plate and screws for fixation of distal tibial and fibular fractures. Hardware is intact. Position and alignment appear anatomic. No acute finding. IMPRESSION: ORIF right ankle fractures as described.  No acute abnormality. Electronically Signed   By: Inge Rise M.D.   On: 06/30/2016 09:35    Time Spent in minutes  30   Jani Gravel M.D on 07/02/2016 at 9:46 AM  Between 7am to 7pm - Pager - [phone]  After 7pm go to [email or web address] - password [facility]  [facility] -  Office  [phone]

## 2016-07-02 NOTE — Clinical Social Work Note (Signed)
Clinical Social Work Assessment  Patient Details  Name: Glen Lee MRN: [id number] Date of Birth: 1933/06/18  Date of referral:  07/02/16               Reason for consult:  Facility Placement                Permission sought to share information with:  Family Supports Permission granted to share information::  Yes, Verbal Permission Granted  Name::     Laurene  Agency::     Relationship::  [employer] Information:  [phone]  Housing/Transportation Living arrangements for the past 2 months:  [place] of Information:  Patient Patient Interpreter Needed:  None Criminal Activity/Legal Involvement Pertinent to Current Situation/Hospitalization:  No - Comment as needed Significant Relationships:  Adult Children, Other Family Members Lives with:  Self Do you feel safe going back to the place where you live?  Yes Need for family participation in patient care:     Care giving concerns:  No family or friends at bedside during initial assessment. Pt gave CSW verbal permission to contact his sister or niece if needed.   Social Worker assessment / plan:  CSW spoke with pt at bedside to complete initial assessment. Pt lives home alone. Pt has a sister and niece in the area. Pt reports his sister is his POA, however he reports her health has been poor lately therefore pt reports his niece has taken over. Pt is agreeable to SNF placement at this time. Pt wants to go to [facility]. CSW will fax referal to facility and follow up with pt when bed off is accepted or declined. CSW has submitted for authorization at this time.   Employment status:  Retired Nurse, adult PT Recommendations:  [place] / Referral to community resources:  [place]  Patient/Family's Response to care:  Pt verbalized understanding of CSW role and expressed appreciation for support. Pt denies any concern  regarding pt care at this time.  Patient/Family's Understanding of and Emotional Response to Diagnosis, Current Treatment, and Prognosis:  Pt understanding and realistic regarding physical limitations. Pt is agreeable to SNF placement at this time. Pt prefers [facility] Pt denies any concern regarding treatment plan at this time. CSW will continue to provide support.  Emotional Assessment Appearance:  Appears stated age Attitude/Demeanor/Rapport:   (Patient was appropriate.) Affect (typically observed):  Accepting, Appropriate, Calm Orientation:  Oriented to Self, Oriented to Place, Oriented to  Time, Oriented to Situation Alcohol / Substance use:  Not Applicable Psych involvement (Current and /or in the community):  No (Comment)  Discharge Needs  Concerns to be addressed:  No discharge needs identified Readmission within the last 30 days:  No Current discharge risk:  Dependent with Mobility Barriers to Discharge:  Continued Medical Work up   [employer], LCSW 07/02/2016, 2:48 PM

## 2016-07-02 NOTE — Clinical Social Work Placement (Signed)
   CLINICAL SOCIAL WORK PLACEMENT  NOTE  Date:  07/02/2016  Patient Details  Name: Glen Lee MRN: [id number] Date of Birth: 10-Nov-1933  Clinical Social Work is seeking post-discharge placement for this patient at the   level of care (*CSW will initial, date and re-position this form in  chart as items are completed):      Patient/family provided with [place] Work Department's list of facilities offering this level of care within the geographic area requested by the patient (or if unable, by the patient's family).      Patient/family informed of their freedom to choose among providers that offer the needed level of care, that participate in Medicare, Medicaid or managed care program needed by the patient, have an available bed and are willing to accept the patient.      Patient/family informed of [place]'s ownership interest in [facility] and [facility], as well as of the fact that they are under no obligation to receive care at these facilities.  PASRR submitted to EDS on       PASRR number received on 07/02/16     Existing PASRR number confirmed on       FL2 transmitted to all facilities in geographic area requested by pt/family on 07/02/16     FL2 transmitted to all facilities within larger geographic area on       Patient informed that his/her managed care company has contracts with or will negotiate with certain facilities, including the following:            Patient/family informed of bed offers received.  Patient chooses bed at       Physician recommends and patient chooses bed at      Patient to be transferred to   on  .  Patient to be transferred to facility by       Patient family notified on   of transfer.  Name of family member notified:        PHYSICIAN Please prepare priority discharge summary, including medications, Please prepare prescriptions     Additional Comment:     _______________________________________________ Alla German, LCSW 07/02/2016, 2:52 PM

## 2016-07-03 DIAGNOSIS — I10 Essential (primary) hypertension: Secondary | ICD-10-CM | POA: Diagnosis not present

## 2016-07-03 DIAGNOSIS — I482 Chronic atrial fibrillation: Secondary | ICD-10-CM | POA: Diagnosis not present

## 2016-07-03 DIAGNOSIS — T148XXD Other injury of unspecified body region, subsequent encounter: Secondary | ICD-10-CM | POA: Diagnosis not present

## 2016-07-03 DIAGNOSIS — R0902 Hypoxemia: Secondary | ICD-10-CM

## 2016-07-03 DIAGNOSIS — N184 Chronic kidney disease, stage 4 (severe): Secondary | ICD-10-CM | POA: Diagnosis not present

## 2016-07-03 DIAGNOSIS — D649 Anemia, unspecified: Secondary | ICD-10-CM | POA: Diagnosis not present

## 2016-07-03 DIAGNOSIS — I4891 Unspecified atrial fibrillation: Secondary | ICD-10-CM | POA: Diagnosis not present

## 2016-07-03 DIAGNOSIS — E119 Type 2 diabetes mellitus without complications: Secondary | ICD-10-CM

## 2016-07-03 DIAGNOSIS — T148XXA Other injury of unspecified body region, initial encounter: Secondary | ICD-10-CM | POA: Diagnosis not present

## 2016-07-03 DIAGNOSIS — M25571 Pain in right ankle and joints of right foot: Secondary | ICD-10-CM | POA: Diagnosis not present

## 2016-07-03 DIAGNOSIS — N401 Enlarged prostate with lower urinary tract symptoms: Secondary | ICD-10-CM | POA: Diagnosis not present

## 2016-07-03 DIAGNOSIS — Z4789 Encounter for other orthopedic aftercare: Secondary | ICD-10-CM | POA: Diagnosis not present

## 2016-07-03 DIAGNOSIS — S82891A Other fracture of right lower leg, initial encounter for closed fracture: Secondary | ICD-10-CM

## 2016-07-03 DIAGNOSIS — S92909A Unspecified fracture of unspecified foot, initial encounter for closed fracture: Secondary | ICD-10-CM | POA: Diagnosis not present

## 2016-07-03 DIAGNOSIS — E1122 Type 2 diabetes mellitus with diabetic chronic kidney disease: Secondary | ICD-10-CM | POA: Diagnosis not present

## 2016-07-03 DIAGNOSIS — R262 Difficulty in walking, not elsewhere classified: Secondary | ICD-10-CM | POA: Diagnosis not present

## 2016-07-03 DIAGNOSIS — K51919 Ulcerative colitis, unspecified with unspecified complications: Secondary | ICD-10-CM | POA: Diagnosis not present

## 2016-07-03 DIAGNOSIS — S82891D Other fracture of right lower leg, subsequent encounter for closed fracture with routine healing: Secondary | ICD-10-CM | POA: Diagnosis not present

## 2016-07-03 DIAGNOSIS — R531 Weakness: Secondary | ICD-10-CM | POA: Diagnosis not present

## 2016-07-03 DIAGNOSIS — M6281 Muscle weakness (generalized): Secondary | ICD-10-CM | POA: Diagnosis not present

## 2016-07-03 DIAGNOSIS — I480 Paroxysmal atrial fibrillation: Secondary | ICD-10-CM

## 2016-07-03 LAB — CBC
HCT: 30.1 % — ABNORMAL LOW (ref 39.0–52.0)
Hemoglobin: 9.7 g/dL — ABNORMAL LOW (ref 13.0–17.0)
MCH: 28.3 pg (ref 26.0–34.0)
MCHC: 32.2 g/dL (ref 30.0–36.0)
MCV: 87.8 fL (ref 78.0–100.0)
PLATELETS: 131 10*3/uL — AB (ref 150–400)
RBC: 3.43 MIL/uL — ABNORMAL LOW (ref 4.22–5.81)
RDW: 14.7 % (ref 11.5–15.5)
WBC: 7.2 10*3/uL (ref 4.0–10.5)

## 2016-07-03 LAB — COMPREHENSIVE METABOLIC PANEL
ALBUMIN: 2.9 g/dL — AB (ref 3.5–5.0)
ALT: 16 U/L — ABNORMAL LOW (ref 17–63)
AST: 17 U/L (ref 15–41)
Alkaline Phosphatase: 88 U/L (ref 38–126)
Anion gap: 8 (ref 5–15)
BUN: 28 mg/dL — AB (ref 6–20)
CHLORIDE: 105 mmol/L (ref 101–111)
CO2: 23 mmol/L (ref 22–32)
Calcium: 9 mg/dL (ref 8.9–10.3)
Creatinine, Ser: 1.88 mg/dL — ABNORMAL HIGH (ref 0.61–1.24)
GFR calc Af Amer: 37 mL/min — ABNORMAL LOW (ref >60)
GFR, EST NON AFRICAN AMERICAN: 32 mL/min — AB (ref >60)
GLUCOSE: 138 mg/dL — AB (ref 65–99)
POTASSIUM: 4.2 mmol/L (ref 3.5–5.1)
Sodium: 136 mmol/L (ref 135–145)
Total Bilirubin: 1.2 mg/dL (ref 0.3–1.2)
Total Protein: 6 g/dL — ABNORMAL LOW (ref 6.5–8.1)

## 2016-07-03 LAB — GLUCOSE, CAPILLARY
GLUCOSE-CAPILLARY: 138 mg/dL — AB (ref 65–99)
Glucose-Capillary: 133 mg/dL — ABNORMAL HIGH (ref 65–99)
Glucose-Capillary: 186 mg/dL — ABNORMAL HIGH (ref 65–99)

## 2016-07-03 MED ORDER — ENOXAPARIN SODIUM 30 MG/0.3ML ~~LOC~~ SOLN: 30.0000 mg | SUBCUTANEOUS | 0 refills | Status: DC

## 2016-07-03 MED ORDER — OXYCODONE HCL 5 MG PO TABS: 5.0000 mg | ORAL_TABLET | ORAL | 0 refills | Status: DC | PRN

## 2016-07-03 MED ORDER — BISACODYL 10 MG RE SUPP
10.0000 mg | Freq: Once | RECTAL | Status: AC
  Administered 2016-07-03: 10 mg via RECTAL
  Filled 2016-07-03: qty 1

## 2016-07-03 MED ORDER — ACETAMINOPHEN 500 MG PO TABS: 500.0000 mg | ORAL_TABLET | Freq: Four times a day (QID) | ORAL | 0 refills | Status: DC | PRN

## 2016-07-03 NOTE — Clinical Social Work Note (Addendum)
[employer] has no record of clinicals faxed to them yesterday by weekend CSW for [facility]. [facility] cannot take patient unless authorization is obtained and this usually takes 24 hours. LOG has been approved by Surveyor, quantity of social work. CSW faxed referral to other facilities that have taken LOG's in the past. [facility] will not take an LOG. Admissions coordinator at [facility] will review with staff and see if they can accept an LOG or not. She will have to see how he is participating in PT first.  Dayton Scrape, [place]  4:08 pm LOG approved by [employer] and patient can discharge there today. Patient and his niece updated and are agreeable.   Dayton Scrape, [place]

## 2016-07-03 NOTE — Consult Note (Signed)
[facility] CM Primary Care Navigator  07/03/2016  Fabrice Dyal April 21, 1934 [id number]  Met with patient at the bedside and spoke with niece Mayra Reel) on the phone to identify possible discharge needs. Patient reports having a fall on the driveway causing severe pain to right ankle and not able to stand up, that had led to this admission/surgery.  Patient confirms Daphene Jaeger, [facility] with [place] as the primary care provider.    Patient shared using [facility] in [place] and [place] mail service to obtain medications without any problem.   Patient manages his own medications at home as stated.  He provides transportation for himself to his doctors' appointments but niece states she will be able to provide it after he discharges.  Niece lives close by him, thus, she will serve as the primary caregiver at home.   Discharge plan is to skilled nursing facility (still in process per niece), preference mentioned as [facility] in [place] but may change as stated.  Patient and niece voiced understanding to call primary care provider's office when he returns back home, for a post discharge follow-up appointment within a week or sooner if needed. Patient letter provided as their reminder.  Niece states that patient had been managing his DM and HTN well. Patient denies further needs or concerns at this time. A1c reading is at 6.6. [facility] care management contact information provided for future needs that may arise.  For additional questions please contact:  Edwena Felty A. Dravyn Severs, BSN, RN-BC [facility] PRIMARY CARE Navigator Cell: [phone]

## 2016-07-03 NOTE — Care Management Important Message (Signed)
Important Message  Patient Details  Name: Glen Lee MRN: [id number] Date of Birth: 1934-03-12   Medicare Important Message Given:  Yes    Zelena Bushong 07/03/2016, 11:11 AM

## 2016-07-03 NOTE — Clinical Social Work Note (Signed)
CSW facilitated patient discharge including contacting patient family and facility to confirm patient discharge plans. Clinical information faxed to facility and family agreeable with plan. CSW arranged ambulance transport via [facility] to [facility]. RN to call report prior to discharge [phone]).  CSW will sign off for now as social work intervention is no longer needed. Please consult [place] again if new needs arise.  Dayton Scrape, [place]

## 2016-07-03 NOTE — Progress Notes (Signed)
Attempted to call report to [facility]. Receiving RN unable to take report, left callback number. [facility] to transport. Will continue to monitor

## 2016-07-03 NOTE — Clinical Social Work Placement (Signed)
   CLINICAL SOCIAL WORK PLACEMENT  NOTE  Date:  07/03/2016  Patient Details  Name: Glen Lee MRN: [id number] Date of Birth: 10/16/1933  Clinical Social Work is seeking post-discharge placement for this patient at the [place] level of care (*CSW will initial, date and re-position this form in  chart as items are completed):  Yes   Patient/family provided with [facility] Work Department's list of facilities offering this level of care within the geographic area requested by the patient (or if unable, by the patient's family).  Yes   Patient/family informed of their freedom to choose among providers that offer the needed level of care, that participate in Medicare, Medicaid or managed care program needed by the patient, have an available bed and are willing to accept the patient.  Yes   Patient/family informed of [facility]'s ownership interest in [facility] and [facility], as well as of the fact that they are under no obligation to receive care at these facilities.  PASRR submitted to EDS on 07/02/16     PASRR number received on       Existing PASRR number confirmed on 07/02/16     FL2 transmitted to all facilities in geographic area requested by pt/family on 07/02/16     FL2 transmitted to all facilities within larger geographic area on       Patient informed that his/her managed care company has contracts with or will negotiate with certain facilities, including the following:        Yes   Patient/family informed of bed offers received.  Patient chooses bed at [facility]     Physician recommends and patient chooses bed at      Patient to be transferred to [facility] on 07/03/16.  Patient to be transferred to facility by [facility]     Patient family notified on 07/03/16 of transfer.  Name of family member notified:  [name]     PHYSICIAN       Additional Comment:     _______________________________________________ Candie Chroman, LCSW 07/03/2016, 4:12 PM

## 2016-07-03 NOTE — Progress Notes (Signed)
Physical Therapy Treatment Patient Details Name: Glen Lee MRN: [id number] DOB: 1933-07-18 Today's Date: 07/03/2016    History of Present Illness Pt is an 83 y.o. male s/p ORIF R ankle 1/19 after fall due to ice and resulting R ankle fracture. Pt has a PMH significant for  anemia, arthritis, atrial fibrillation, DM without complication, edema, esophageal reflux, hyperlipidemia, hypertension, thyroid nodule, tremor of R hand, and vitamin D deficiency.     PT Comments    Patient with difficulty attempting to stand today.  Unable to reach upright standing position on LLE.   Agree with need for SNF at d/c for continued therapy.  Follow Up Recommendations  SNF;Supervision/Assistance - 24 hour     Equipment Recommendations  None recommended by PT    Recommendations for Other Services       Precautions / Restrictions Precautions Precautions: Fall Precaution Comments: fall resulted in current hospitalization Restrictions Weight Bearing Restrictions: Yes RLE Weight Bearing: Non weight bearing    Mobility  Bed Mobility Overal bed mobility: Needs Assistance Bed Mobility: Supine to Sit;Sit to Supine     Supine to sit: Max assist;+2 for physical assistance Sit to supine: Max assist;+2 for physical assistance   General bed mobility comments: Requries assistance to move LE's OOB and bring trunk upright at EOB.  Once upright, able to maintain balance.  Requires +2 max assist to return to supine, to control trunk and bring LE's onto bed.  Transfers Overall transfer level: Needs assistance Equipment used: Rolling walker (2 wheeled) Transfers: Sit to/from Stand Sit to Stand: Max assist;+2 physical assistance         General transfer comment: Verbal cues for hand placement, technique, NWB on RLE.  Supported RLE in front of patient to maintain NWB (placed on staff's shoe).  Attempted x3 to move to standing with +2 max assist.  Patient able to clear hips from bed, but unable to move  to upright standing position.  Returned to supine.  Ambulation/Gait                 Stairs            Wheelchair Mobility    Modified Rankin (Stroke Patients Only)       Balance           Standing balance support: Bilateral upper extremity supported Standing balance-Leahy Scale: Zero Standing balance comment: Reliant on RW and therapists for support.                    Cognition Arousal/Alertness: Awake/alert Behavior During Therapy: WFL for tasks assessed/performed;Flat affect Overall Cognitive Status: Within Functional Limits for tasks assessed                      Exercises Total Joint Exercises Ankle Circles/Pumps: AROM;Left;10 reps;Supine Quad Sets: AROM;Both;10 reps;Supine Short Arc Quad: AROM;Left;10 reps;Supine Heel Slides: AROM;Left;10 reps;Supine Hip ABduction/ADduction: AROM;Left;10 reps;Supine Bridges: AROM;Left;10 reps;Supine;Other (comment) (1/2 bridges)    General Comments        Pertinent Vitals/Pain Pain Assessment: Faces Faces Pain Scale: Hurts little more Pain Location: R ankle Pain Descriptors / Indicators: Aching;Grimacing Pain Intervention(s): Monitored during session;Repositioned    Home Living                      Prior Function            PT Goals (current goals can now be found in the care plan section) Acute Rehab PT Goals Patient  Stated Goal: to get rehab before going home Progress towards PT goals: Progressing toward goals    Frequency    Min 3X/week      PT Plan Current plan remains appropriate    Co-evaluation             End of Session Equipment Utilized During Treatment: Gait belt Activity Tolerance: Patient limited by fatigue;Patient limited by pain Patient left: in bed;with call bell/phone within reach;with bed alarm set     Time: 1521-1544 PT Time Calculation (min) (ACUTE ONLY): 23 min  Charges:  $Therapeutic Exercise: 8-22 mins $Therapeutic Activity: 8-22  mins                    G Codes:      Despina Pole Jul 22, 2016, 8:17 PM Carita Pian. Sanjuana Kava, [place] Pager [phone]

## 2016-07-03 NOTE — Progress Notes (Signed)
Orthopaedic Trauma Service Progress Note  Subjective  Doing well No complaints Has selected [facility], waiting to hear back regarding bed availability  Ortho issues stable Pain controlled   ROS As above  Objective   BP (!) 153/64 (BP Location: Right Arm)   Pulse 73   Temp 98.1 F (36.7 C) (Oral)   Resp (!) 67   Ht 6' (1.829 m)   Wt 97.5 kg (215 lb)   SpO2 95%   BMI 29.16 kg/m   Intake/Output      01/21 0701 - 01/22 0700 01/22 0701 - 01/23 0700   P.O. 240    Total Intake(mL/kg) 240 (2.5)    Urine (mL/kg/hr) 1150 (0.5)    Total Output 1150     Net -910            Labs Results for Glen Lee, Glen Lee (MRN [id number]) as of 07/03/2016 09:20  Ref. Range 07/03/2016 05:24  Sodium Latest Ref Range: 135 - 145 mmol/L 136  Potassium Latest Ref Range: 3.5 - 5.1 mmol/L 4.2  Chloride Latest Ref Range: 101 - 111 mmol/L 105  CO2 Latest Ref Range: 22 - 32 mmol/L 23  Glucose Latest Ref Range: 65 - 99 mg/dL 138 (H)  BUN Latest Ref Range: 6 - 20 mg/dL 28 (H)  Creatinine Latest Ref Range: 0.61 - 1.24 mg/dL 1.88 (H)  Calcium Latest Ref Range: 8.9 - 10.3 mg/dL 9.0  Anion gap Latest Ref Range: 5 - 15  8  Alkaline Phosphatase Latest Ref Range: 38 - 126 U/L 88  Albumin Latest Ref Range: 3.5 - 5.0 g/dL 2.9 (L)  AST Latest Ref Range: 15 - 41 U/L 17  ALT Latest Ref Range: 17 - 63 U/L 16 (L)  Total Protein Latest Ref Range: 6.5 - 8.1 g/dL 6.0 (L)  Total Bilirubin Latest Ref Range: 0.3 - 1.2 mg/dL 1.2  EGFR (African American) Latest Ref Range: >60 mL/min 37 (L)  EGFR (Non-African Amer.) Latest Ref Range: >60 mL/min 32 (L)  WBC Latest Ref Range: 4.0 - 10.5 K/uL 7.2  RBC Latest Ref Range: 4.22 - 5.81 MIL/uL 3.43 (L)  Hemoglobin Latest Ref Range: 13.0 - 17.0 g/dL 9.7 (L)  HCT Latest Ref Range: 39.0 - 52.0 % 30.1 (L)  MCV Latest Ref Range: 78.0 - 100.0 fL 87.8  MCH Latest Ref Range: 26.0 - 34.0 pg 28.3  MCHC Latest Ref Range: 30.0 - 36.0 g/dL 32.2  RDW Latest Ref Range: 11.5 - 15.5 % 14.7   Platelets Latest Ref Range: 150 - 400 K/uL 131 (L)     Exam  Gen: awake and alert, appears very comfortable, very pleasant  Ext:       Right Lower Extremity   Splint fitting well  Dressing c/d/i  Ext warm  + DP pulse  Brisk cap refill   DPN, SPN, TN sensation intact  EHL, FHL, lesser toe motor functions intact     Assessment and Plan   POD/HD#: 47  83 y/o male s/p fall with R ankle fracture s/p ORIF  - R ankle fx s/p ORIF  NWB x 6 weeks as syndesmosis was intact  Splint remains on until first office follow up   PT/OT  Ice and elevate  Unrestricted ROM of toes and knee    snf when bed available    - Pain management:  Oxy IR and tylenol   - Medical issues   Per primary service   - DVT/PE prophylaxis:  Renally adjusted lovenox x 21 days  - ID:  Completed periop abx   - Dispo:  Ok to [facility] to snf from ortho standpoint  Follow up with ortho in 10-14 days     Jari Pigg, PA-C [facility] [phone] (O) 07/03/2016 9:19 AM

## 2016-07-03 NOTE — Op Note (Signed)
NAMELADEN, FIELDHOUSE NO.:  [id number]  MEDICAL RECORD NO.:  [id number]  LOCATION:                                 FACILITY:  PHYSICIAN:  Astrid Divine. Marcelino Scot, M.D. DATE OF BIRTH:  27-Oct-1933  DATE OF PROCEDURE:  06/30/2016 DATE OF DISCHARGE:                              OPERATIVE REPORT   PREOPERATIVE DIAGNOSIS:  Right ankle trimalleolar fracture subluxation.  POSTOPERATIVE DIAGNOSIS:  Right ankle trimalleolar fracture subluxation.  PROCEDURE: 1. Open reduction and internal fixation of right ankle trimalleolar     fracture subluxation without repair of the posterior lip. 2. Stress evaluation of the syndesmosis under live fluoro.  SURGEON:  Astrid Divine. Marcelino Scot, MD  ASSISTANT:  None.  ANESTHESIA:  General.  I/O:  Jackson/O 1100 mL crystalloid.  EBL:  50 mL.  TOURNIQUET:  None.  DISPOSITION:  To PACU.  CONDITION:  Stable.  BRIEF SUMMARY AND INDICATION FOR PROCEDURE:  The patient is a very pleasant 83 year old male who slipped on the ice in his driveway resulting in ankle fracture and deformity.  He denied loss of consciousness or other injury.  He was initially seen and evaluated in our ED and placed into a posterior and stirrup splint.  He now presents after evaluation and optimization by the Medical Service for definitive treatment if the skin allows, otherwise, spanning external fixation as a staged maneuver.  I did discuss with the patient, risks and benefits of surgery including the potential for wound breakdown that could result in deep infection and potentially result in limb loss, arthritis, loss of motion, DVT, PE, heart attack, stroke, and other medical complications in addition to symptomatic hardware.  The patient acknowledged these risk and did wish to proceed.  BRIEF SUMMARY OF PROCEDURE:  Patient was taken to the operating room where general anesthesia was induced.  She did receive Cleocin for preoperative antibiotics given a rash to  Keflex.  His right lower extremity was prepped and draped in usual sterile fashion.  No tourniquet was used during the procedure.  The skin showed signs of the persistent subluxation with blistering present medially.  The skin wrinkled easily, however, on the lateral side.  Consequently, I felt it was stable for direct open reduction on the lateral side with percutaneous fixation of the medial side, this could be obtained versus return to the OR.  Because of his age and comorbidities as well as the potential to obtain a good reduction as performed by the immediate efforts at same in the OR, I did feel optimistic that we could place this fixation percutaneously.  The superficial peroneal nerve was carefully watched for and retracted anteriorly.  The fracture site was exposed, leaving the periosteum intact.  The fracture site was cleaned out with curette and then reduced and held with a Lobster claw clamp.  It was rather significantly comminuted.  I did apply internal rotation and anterior translation of the distal ankle fragment to assist with this reduction and maintenance during plate application.  Standard screws were placed in the shaft and this helped to eliminate the persistent subluxation of the ankle.  Once this was achieved, I then placed some distal fixation, double checked  the plate position on orthogonal views and continued with 2 bicortical screws proximally, then 1 additional locked as well as a standard screw distally and then 3 additional ones that were locked.  One of the screw holes did not engage the lock screws distally and consequently could not be used.  I then turned my attention to the medial side where 2 small stab incisions were made and the K-wires for cannulated screws placed across the medial malleolar fracture into the distal metaphysis.  These were drilled and then partially threaded screws placed, achieving a good fixation and compression.  This fracture  was non-anatomic, but did not have any debris within the joint and established a firm medial anchor for the deltoid and helped to counter the fracture forces and was consistent with appropriate position of the talus within the mortise.  I then performed a live fluoro stress evaluation of the syndesmosis and this did not reveal any instability.  All wounds were irrigated thoroughly and then closed in standard layered fashion using 2-0 Vicryl and a series of both horizontal mattress and vertical mattress sutures as his skin has a tendency to invert on the lateral side, simple sutures were placed medially.  Posterior stirrup splint was then applied as well as a very padded dressing.  He was taken to the PACU in stable condition.  PROGNOSIS:  Patient will be nonweightbearing for the next 6 weeks with protected weightbearing thereafter for an additional 2 weeks in a Cam boot.  We will plan to see him in 2 weeks for removal of sutures and re- evaluation.     Astrid Divine. Marcelino Scot, M.D.   ______________________________ Astrid Divine. Marcelino Scot, M.D.    MHH/MEDQ  D:  06/30/2016  T:  07/01/2016  Job:  [id number]

## 2016-07-03 NOTE — Discharge Summary (Signed)
Physician Discharge Summary  Dawan Farney XFG:[id number] DOB: 05-Aug-1933 DOA: 06/29/2016  PCP: Daphene Jaeger, PA-C  Admit date: 06/29/2016 Discharge date: 07/03/2016  Admitted From: Home Disposition: Home  Recommendations for Outpatient Follow-up:  1. Follow up with PCP in 1-2 weeks 2. Please obtain BMP/CBC in one week 3. Lovenox for DVT prophylaxis  Home Health: NA Equipment/Devices:NA  Discharge Condition: Stable CODE STATUS: Full Code Diet recommendation: Diet Carb Modified Fluid consistency: Thin; Room service appropriate? Yes Diet Carb Modified  Brief/Interim Summary: 83 y.o.malewith medical history significant of ambulatory dysfunction, who presents to the hospital after staying mechanical fall. This morning when he was walking through his dry weight which was covered with ice he fell sustaining injury to his right ankle. After the fall he developed severe pain of the right ankle, sharp in nature, 10 out of 10 intensity, worse with movement, no improving factors, he was unable to stand back on his feet. He was brought into the hospital for further evaluation.  Discharge Diagnoses:  Principal Problem:   Ankle fracture Active Problems:   Diabetes mellitus (HCC)   CKD (chronic kidney disease) stage 4, GFR 15-29 ml/min (HCC)   PAF (paroxysmal atrial fibrillation) (HCC)   Hypoxia   Right ankle fracture.  Secondary to mechanical fall, Underwent ORIF 1/19 Patient did very okay, per orthopedics had no dysfunction of the syndesmosis. Discharge to skilled nursing facility for further rehabilitation. DVT prophylaxis: Lovenox. Pain control: Oxycodone, prescribed by orthopedics.  Transient hypoxia Likely postoperative atelectasis, currently 95% on room air. CXR was done and showed atelectasis versus pneumonia, this is likely atelectasis as patient does not have WBC or fever. No indication for antibiotics.  H/o atrial fibrillation.Chronic and stable.  Rate is controlled  with diltiazem 120 mg daily. CHA2DS2-VASc is likely 3 secondary to HTN, DM and age, patient not on anticoagulation secondary to previous bleeding.   Type 2 diabetes mellitus.monitor on ISS, restarted home medications on discharge.    BPH. Continue tamsulosin and finasteride.   Chronic kidney disease. Stage III  Base creatinine around 2.0, calculated GFR about 30. Creatinine is 1.8 on discharge.   Discharge Instructions  Discharge Instructions    Diet Carb Modified    Complete by:  As directed    Increase activity slowly    Complete by:  As directed    Non weight bearing    Complete by:  As directed    Laterality:  right   Extremity:  Lower     Allergies as of 07/03/2016      Reactions   Keflet [cephalexin] Rash      Medication List    TAKE these medications   acetaminophen 500 MG tablet Commonly known as:  TYLENOL Take 1-2 tablets (500-1,000 mg total) by mouth every 6 (six) hours as needed.   diltiazem 120 MG 24 hr capsule Commonly known as:  CARDIZEM CD Take 1 capsule (120 mg total) by mouth daily.   enoxaparin 30 MG/0.3ML injection Commonly known as:  LOVENOX Inject 0.3 mLs (30 mg total) into the skin daily.   exenatide 5 MCG/0.02ML Sopn injection Commonly known as:  BYETTA Inject 5 mcg into the skin daily as needed (bs > 130).   ferrous sulfate 325 (65 FE) MG tablet Take 325 mg by mouth daily with breakfast.   finasteride 5 MG tablet Commonly known as:  PROSCAR Take 5 mg by mouth daily.   Mesalamine 800 MG Tbec Take 800 mg by mouth 3 (three) times daily.   Omega-3 Fish Oil  300 MG Caps Take 300 mg by mouth daily.   oxyCODONE 5 MG immediate release tablet Commonly known as:  Oxy IR/ROXICODONE Take 1 tablet (5 mg total) by mouth every 4 (four) hours as needed for moderate pain or severe pain.   oxymetazoline 0.05 % nasal spray Commonly known as:  AFRIN Place 1 spray into both nostrils 2 (two) times daily as needed for congestion.   pantoprazole 40  MG tablet Commonly known as:  PROTONIX Take 40 mg by mouth daily.   POTASSIUM GLUCONATE PO Take 1 tablet by mouth daily.   simvastatin 40 MG tablet Commonly known as:  ZOCOR Take 40 mg by mouth daily.   sitaGLIPtin 50 MG tablet Commonly known as:  JANUVIA Take 50 mg by mouth daily.   tamsulosin 0.4 MG Caps capsule Commonly known as:  FLOMAX Take 0.4 mg by mouth daily. Reported on 08/17/2015   Vitamin D (Ergocalciferol) 50000 units Caps capsule Commonly known as:  DRISDOL Take 50,000 Units by mouth every 7 (seven) days.   Vitamin D3 3000 units Tabs Take 1 tablet by mouth daily.      Follow-up Information    HANDY,MICHAEL H, MD. Schedule an appointment as soon as possible for a visit in 2 week(s).   Specialty:  Orthopedic Surgery Contact information: [place] [address] [place] [address] [phone]          Allergies  Allergen Reactions  . Keflet [Cephalexin] Rash    Consultations:  Treatment Team:   Marchia Bond, MD  Altamese Decherd, MD   Procedures Open reduction and internal fixation of right ankle trimalleolar fracture subluxation without repair of the posterior lip.  Radiological studies: Dg Ankle Complete Right  Result Date: 06/30/2016 CLINICAL DATA:  Status post ankle fracture. EXAM: RIGHT ANKLE - COMPLETE 3+ VIEW COMPARISON:  06/30/2016 FINDINGS: Exam detail is diminished secondary to overlying casting material. Sideplate and screw device is identified reducing the distal fibular fracture. Two screws reduces the medial malleolar fracture. The hardware components and fracture fragments are in anatomic alignment. IMPRESSION: 1. Status post ORIF of fractures involving the distal fibula and medial malleolus. Electronically Signed   By: Kerby Moors M.D.   On: 06/30/2016 11:27   Dg Ankle Complete Right  Result Date: 06/30/2016 CLINICAL DATA:  Intraoperative imaging for fixation of right ankle fracture suffered in a slip and fall  06/29/2016. Initial encounter. EXAM: RIGHT ANKLE - COMPLETE 3+ VIEW; DG C-ARM 61-120 MIN COMPARISON:  Plain films of the right ankle 06/29/2016. FINDINGS: We are provided with 5 fluoroscopic spot views of the right ankle. Images demonstrate 2 screws in the medial malleolus and lateral plate and screws for fixation of distal tibial and fibular fractures. Hardware is intact. Position and alignment appear anatomic. No acute finding. IMPRESSION: ORIF right ankle fractures as described.  No acute abnormality. Electronically Signed   By: Inge Rise M.D.   On: 06/30/2016 09:35   Dg Ankle Complete Right  Result Date: 06/29/2016 CLINICAL DATA:  Right ankle pain after slip and fall.  Swelling. EXAM: RIGHT ANKLE - COMPLETE 3+ VIEW COMPARISON:  None. FINDINGS: Weber B fracture pattern of the ankle with oblique lateral malleolar fracture, transverse medial malleolar fracture, and questionable posterior malleolar fracture. The tibial shaft is subluxed about 10 mm medially with respect to the talus. Surrounding soft tissue swelling noted. Plantar calcaneal spur.  Vascular calcifications noted. IMPRESSION: 1. Weber B fracture pattern with fractures of the lateral and medial malleolus and probable fracture of  the posterior malleolus given the medial subluxation of the tibia with respect to the talus. Unstable fracture. Electronically Signed   By: Van Clines M.D.   On: 06/29/2016 13:57   Ct Head Wo Contrast  Result Date: 06/29/2016 CLINICAL DATA:  Patient slipped and fell this afternoon. EXAM: CT HEAD WITHOUT CONTRAST TECHNIQUE: Contiguous axial images were obtained from the base of the skull through the vertex without intravenous contrast. COMPARISON:  None. FINDINGS: Brain: Mild superficial and central atrophy with chronic appearing small vessel ischemic disease of periventricular white matter. No acute intracranial hemorrhage, mass or midline shift. Basal cisterns are midline without effacement. Vascular:  Moderate calcification of the carotid siphons bilaterally. No hyperdense vasculature. Skull: Nonacute. Sinuses/Orbits: Mucous retention cyst partially imaged in the right maxillary sinus. Mastoids are clear. Orbits are intact with bilateral lens replacement surgical change. Other: None IMPRESSION: No acute intracranial abnormality. Chronic appearing small vessel ischemic disease of periventricular white matter with cerebral atrophy. Electronically Signed   By: Ashley Royalty M.D.   On: 06/29/2016 14:05   Dg Chest Port 1 View  Result Date: 07/01/2016 CLINICAL DATA:  Hypoxia EXAM: PORTABLE CHEST 1 VIEW COMPARISON:  08/05/2014 FINDINGS: Heart size is normal. The aorta is unfolded. There is left lower lobe atelectasis/pneumonia. The remainder the chest is clear. The vascularity is normal. No effusions. IMPRESSION: Left lower lobe atelectasis/ pneumonia. Electronically Signed   By: Nelson Chimes M.D.   On: 07/01/2016 08:28   Dg C-arm 61-120 Min  Result Date: 06/30/2016 CLINICAL DATA:  Intraoperative imaging for fixation of right ankle fracture suffered in a slip and fall 06/29/2016. Initial encounter. EXAM: RIGHT ANKLE - COMPLETE 3+ VIEW; DG C-ARM 61-120 MIN COMPARISON:  Plain films of the right ankle 06/29/2016. FINDINGS: We are provided with 5 fluoroscopic spot views of the right ankle. Images demonstrate 2 screws in the medial malleolus and lateral plate and screws for fixation of distal tibial and fibular fractures. Hardware is intact. Position and alignment appear anatomic. No acute finding. IMPRESSION: ORIF right ankle fractures as described.  No acute abnormality. Electronically Signed   By: Inge Rise M.D.   On: 06/30/2016 09:35     Subjective:  Discharge Exam: Vitals:   07/02/16 1159 07/02/16 1410 07/02/16 2043 07/03/16 0619  BP: (!) 173/65 (!) 145/64 (!) 152/61 (!) 153/64  Pulse: 65 66 64 73  Resp:  19 16 (!) 67  Temp:  97.8 F (36.6 C) 99 F (37.2 C) 98.1 F (36.7 C)  TempSrc:   Oral  Oral  SpO2:  100% 96% 95%  Weight:      Height:       General: Pt is alert, awake, not in acute distress Cardiovascular: RRR, S1/S2 +, no rubs, no gallops Respiratory: CTA bilaterally, no wheezing, no rhonchi Abdominal: Soft, NT, ND, bowel sounds + Extremities: no edema, no cyanosis   The results of significant diagnostics from this hospitalization (including imaging, microbiology, ancillary and laboratory) are listed below for reference.    Microbiology: Recent Results (from the past 240 hour(s))  Surgical PCR screen     Status: None   Collection Time: 06/29/16 10:50 PM  Result Value Ref Range Status   MRSA, PCR NEGATIVE NEGATIVE Final   Staphylococcus aureus NEGATIVE NEGATIVE Final    Comment:        The Xpert SA Assay (FDA approved for NASAL specimens in patients over 16 years of age), is one component of a comprehensive surveillance program.  Test performance has been validated by  [facility] for patients greater than or equal to 70 year old. It is not intended to diagnose infection nor to guide or monitor treatment.      Labs: BNP (last 3 results) No results for input(s): BNP in the last 8760 hours. Basic Metabolic Panel:  Recent Labs Lab 06/29/16 1504 06/29/16 2137 06/30/16 0513 07/01/16 0340 07/02/16 0534 07/03/16 0524  NA 142  --  140 136 136 136  K 4.2  --  3.9 4.2 4.2 4.2  CL 105  --  108 105 105 105  CO2  --   --  26 24 23 23   GLUCOSE 155*  --  137* 125* 131* 138*  BUN 25*  --  20 18 24* 28*  CREATININE 1.60* 1.52* 1.53* 1.47* 1.77* 1.88*  CALCIUM  --   --  9.2 8.7* 8.8* 9.0   Liver Function Tests:  Recent Labs Lab 06/30/16 0513 07/02/16 0534 07/03/16 0524  AST 16 14* 17  ALT 13* 12* 16*  ALKPHOS 89 77 88  BILITOT 0.8 1.0 1.2  PROT 5.8* 5.5* 6.0*  ALBUMIN 3.4* 2.9* 2.9*   No results for input(s): LIPASE, AMYLASE in the last 168 hours. No results for input(s): AMMONIA in the last 168 hours. CBC:  Recent Labs Lab 06/29/16 1451  06/29/16 1504 06/29/16 2137 06/30/16 0513 07/02/16 0534 07/03/16 0524  WBC 7.5  --  7.5 6.0 5.9 7.2  NEUTROABS 6.1  --   --   --   --   --   HGB 10.8* 10.2* 10.0* 9.7* 9.3* 9.7*  HCT 32.5* 30.0* 30.6* 29.9* 28.3* 30.1*  MCV 86.7  --  87.2 87.9 87.6 87.8  PLT 139*  --  130* 133* 125* 131*   Cardiac Enzymes: No results for input(s): CKTOTAL, CKMB, CKMBINDEX, TROPONINI in the last 168 hours. BNP: Invalid input(s): POCBNP CBG:  Recent Labs Lab 07/01/16 2139 07/02/16 0629 07/02/16 1114 07/02/16 1649 07/02/16 2052  GLUCAP 128* 119* 130* 136* 132*   D-Dimer No results for input(s): DDIMER in the last 72 hours. Hgb A1c No results for input(s): HGBA1C in the last 72 hours. Lipid Profile No results for input(s): CHOL, HDL, LDLCALC, TRIG, CHOLHDL, LDLDIRECT in the last 72 hours. Thyroid function studies No results for input(s): TSH, T4TOTAL, T3FREE, THYROIDAB in the last 72 hours.  Invalid input(s): FREET3 Anemia work up No results for input(s): VITAMINB12, FOLATE, FERRITIN, TIBC, IRON, RETICCTPCT in the last 72 hours. Urinalysis    Component Value Date/Time   COLORURINE YELLOW 08/05/2014 1419   APPEARANCEUR CLOUDY (A) 08/05/2014 1419   LABSPEC 1.023 08/05/2014 1419   PHURINE 5.0 08/05/2014 1419   GLUCOSEU NEGATIVE 08/05/2014 1419   HGBUR NEGATIVE 08/05/2014 1419   BILIRUBINUR NEGATIVE 08/05/2014 1419   KETONESUR NEGATIVE 08/05/2014 1419   PROTEINUR 30 (A) 08/05/2014 1419   UROBILINOGEN 0.2 08/05/2014 1419   NITRITE NEGATIVE 08/05/2014 1419   LEUKOCYTESUR NEGATIVE 08/05/2014 1419   Sepsis Labs Invalid input(s): PROCALCITONIN,  WBC,  LACTICIDVEN Microbiology Recent Results (from the past 240 hour(s))  Surgical PCR screen     Status: None   Collection Time: 06/29/16 10:50 PM  Result Value Ref Range Status   MRSA, PCR NEGATIVE NEGATIVE Final   Staphylococcus aureus NEGATIVE NEGATIVE Final    Comment:        The Xpert SA Assay (FDA approved for NASAL specimens in  patients over 23 years of age), is one component of a comprehensive surveillance program.  Test performance has been validated by [facility] for patients greater than or equal to 5 year old. It is not intended to diagnose infection nor to guide or monitor treatment.      Time coordinating discharge: Over 30 minutes  SIGNED:   Birdie Hopes, MD  [facility] 07/03/2016, 11:11 AM Pager   If 7PM-7AM, please contact night-coverage [email or web address] Password [email or web address]

## 2016-07-04 ENCOUNTER — Encounter (HOSPITAL_COMMUNITY): Payer: Self-pay | Admitting: Orthopedic Surgery

## 2016-07-07 DIAGNOSIS — E1122 Type 2 diabetes mellitus with diabetic chronic kidney disease: Secondary | ICD-10-CM | POA: Diagnosis not present

## 2016-07-07 DIAGNOSIS — I482 Chronic atrial fibrillation: Secondary | ICD-10-CM | POA: Diagnosis not present

## 2016-07-07 DIAGNOSIS — N401 Enlarged prostate with lower urinary tract symptoms: Secondary | ICD-10-CM | POA: Diagnosis not present

## 2016-07-07 DIAGNOSIS — S82891D Other fracture of right lower leg, subsequent encounter for closed fracture with routine healing: Secondary | ICD-10-CM | POA: Diagnosis not present

## 2016-07-11 DIAGNOSIS — R262 Difficulty in walking, not elsewhere classified: Secondary | ICD-10-CM | POA: Diagnosis not present

## 2016-07-11 DIAGNOSIS — M25571 Pain in right ankle and joints of right foot: Secondary | ICD-10-CM | POA: Diagnosis not present

## 2016-07-11 DIAGNOSIS — Z4789 Encounter for other orthopedic aftercare: Secondary | ICD-10-CM | POA: Diagnosis not present

## 2016-07-15 DIAGNOSIS — I480 Paroxysmal atrial fibrillation: Secondary | ICD-10-CM | POA: Diagnosis not present

## 2016-07-15 DIAGNOSIS — E1122 Type 2 diabetes mellitus with diabetic chronic kidney disease: Secondary | ICD-10-CM | POA: Diagnosis not present

## 2016-07-15 DIAGNOSIS — S82851D Displaced trimalleolar fracture of right lower leg, subsequent encounter for closed fracture with routine healing: Secondary | ICD-10-CM | POA: Diagnosis not present

## 2016-07-15 DIAGNOSIS — I129 Hypertensive chronic kidney disease with stage 1 through stage 4 chronic kidney disease, or unspecified chronic kidney disease: Secondary | ICD-10-CM | POA: Diagnosis not present

## 2016-07-15 DIAGNOSIS — Z9181 History of falling: Secondary | ICD-10-CM | POA: Diagnosis not present

## 2016-07-15 DIAGNOSIS — N184 Chronic kidney disease, stage 4 (severe): Secondary | ICD-10-CM | POA: Diagnosis not present

## 2016-07-16 DIAGNOSIS — R262 Difficulty in walking, not elsewhere classified: Secondary | ICD-10-CM | POA: Diagnosis not present

## 2016-07-16 DIAGNOSIS — Z4789 Encounter for other orthopedic aftercare: Secondary | ICD-10-CM | POA: Diagnosis not present

## 2016-07-16 DIAGNOSIS — K51919 Ulcerative colitis, unspecified with unspecified complications: Secondary | ICD-10-CM | POA: Diagnosis not present

## 2016-07-16 DIAGNOSIS — T148XXA Other injury of unspecified body region, initial encounter: Secondary | ICD-10-CM | POA: Diagnosis not present

## 2016-07-16 DIAGNOSIS — M6281 Muscle weakness (generalized): Secondary | ICD-10-CM | POA: Diagnosis not present

## 2016-07-17 DIAGNOSIS — S82851D Displaced trimalleolar fracture of right lower leg, subsequent encounter for closed fracture with routine healing: Secondary | ICD-10-CM | POA: Diagnosis not present

## 2016-07-17 DIAGNOSIS — S93311D Subluxation of tarsal joint of right foot, subsequent encounter: Secondary | ICD-10-CM | POA: Diagnosis not present

## 2016-07-18 DIAGNOSIS — Z9181 History of falling: Secondary | ICD-10-CM | POA: Diagnosis not present

## 2016-07-18 DIAGNOSIS — E1122 Type 2 diabetes mellitus with diabetic chronic kidney disease: Secondary | ICD-10-CM | POA: Diagnosis not present

## 2016-07-18 DIAGNOSIS — S82851D Displaced trimalleolar fracture of right lower leg, subsequent encounter for closed fracture with routine healing: Secondary | ICD-10-CM | POA: Diagnosis not present

## 2016-07-18 DIAGNOSIS — I480 Paroxysmal atrial fibrillation: Secondary | ICD-10-CM | POA: Diagnosis not present

## 2016-07-18 DIAGNOSIS — N184 Chronic kidney disease, stage 4 (severe): Secondary | ICD-10-CM | POA: Diagnosis not present

## 2016-07-18 DIAGNOSIS — I129 Hypertensive chronic kidney disease with stage 1 through stage 4 chronic kidney disease, or unspecified chronic kidney disease: Secondary | ICD-10-CM | POA: Diagnosis not present

## 2016-07-21 DIAGNOSIS — E1122 Type 2 diabetes mellitus with diabetic chronic kidney disease: Secondary | ICD-10-CM | POA: Diagnosis not present

## 2016-07-21 DIAGNOSIS — I480 Paroxysmal atrial fibrillation: Secondary | ICD-10-CM | POA: Diagnosis not present

## 2016-07-21 DIAGNOSIS — S82851D Displaced trimalleolar fracture of right lower leg, subsequent encounter for closed fracture with routine healing: Secondary | ICD-10-CM | POA: Diagnosis not present

## 2016-07-21 DIAGNOSIS — Z9181 History of falling: Secondary | ICD-10-CM | POA: Diagnosis not present

## 2016-07-21 DIAGNOSIS — N184 Chronic kidney disease, stage 4 (severe): Secondary | ICD-10-CM | POA: Diagnosis not present

## 2016-07-21 DIAGNOSIS — I129 Hypertensive chronic kidney disease with stage 1 through stage 4 chronic kidney disease, or unspecified chronic kidney disease: Secondary | ICD-10-CM | POA: Diagnosis not present

## 2016-07-25 DIAGNOSIS — N184 Chronic kidney disease, stage 4 (severe): Secondary | ICD-10-CM | POA: Diagnosis not present

## 2016-07-25 DIAGNOSIS — E1122 Type 2 diabetes mellitus with diabetic chronic kidney disease: Secondary | ICD-10-CM | POA: Diagnosis not present

## 2016-07-25 DIAGNOSIS — S82851D Displaced trimalleolar fracture of right lower leg, subsequent encounter for closed fracture with routine healing: Secondary | ICD-10-CM | POA: Diagnosis not present

## 2016-07-25 DIAGNOSIS — I480 Paroxysmal atrial fibrillation: Secondary | ICD-10-CM | POA: Diagnosis not present

## 2016-07-25 DIAGNOSIS — I129 Hypertensive chronic kidney disease with stage 1 through stage 4 chronic kidney disease, or unspecified chronic kidney disease: Secondary | ICD-10-CM | POA: Diagnosis not present

## 2016-07-25 DIAGNOSIS — Z9181 History of falling: Secondary | ICD-10-CM | POA: Diagnosis not present

## 2016-07-26 DIAGNOSIS — S82851D Displaced trimalleolar fracture of right lower leg, subsequent encounter for closed fracture with routine healing: Secondary | ICD-10-CM | POA: Diagnosis not present

## 2016-07-26 DIAGNOSIS — I480 Paroxysmal atrial fibrillation: Secondary | ICD-10-CM | POA: Diagnosis not present

## 2016-07-26 DIAGNOSIS — Z9181 History of falling: Secondary | ICD-10-CM | POA: Diagnosis not present

## 2016-07-26 DIAGNOSIS — E1122 Type 2 diabetes mellitus with diabetic chronic kidney disease: Secondary | ICD-10-CM | POA: Diagnosis not present

## 2016-07-26 DIAGNOSIS — I129 Hypertensive chronic kidney disease with stage 1 through stage 4 chronic kidney disease, or unspecified chronic kidney disease: Secondary | ICD-10-CM | POA: Diagnosis not present

## 2016-07-26 DIAGNOSIS — N184 Chronic kidney disease, stage 4 (severe): Secondary | ICD-10-CM | POA: Diagnosis not present

## 2016-07-28 DIAGNOSIS — I129 Hypertensive chronic kidney disease with stage 1 through stage 4 chronic kidney disease, or unspecified chronic kidney disease: Secondary | ICD-10-CM | POA: Diagnosis not present

## 2016-07-28 DIAGNOSIS — N184 Chronic kidney disease, stage 4 (severe): Secondary | ICD-10-CM | POA: Diagnosis not present

## 2016-07-28 DIAGNOSIS — S82851D Displaced trimalleolar fracture of right lower leg, subsequent encounter for closed fracture with routine healing: Secondary | ICD-10-CM | POA: Diagnosis not present

## 2016-07-28 DIAGNOSIS — E1122 Type 2 diabetes mellitus with diabetic chronic kidney disease: Secondary | ICD-10-CM | POA: Diagnosis not present

## 2016-07-28 DIAGNOSIS — I480 Paroxysmal atrial fibrillation: Secondary | ICD-10-CM | POA: Diagnosis not present

## 2016-07-28 DIAGNOSIS — Z9181 History of falling: Secondary | ICD-10-CM | POA: Diagnosis not present

## 2016-08-01 DIAGNOSIS — I129 Hypertensive chronic kidney disease with stage 1 through stage 4 chronic kidney disease, or unspecified chronic kidney disease: Secondary | ICD-10-CM | POA: Diagnosis not present

## 2016-08-01 DIAGNOSIS — E1122 Type 2 diabetes mellitus with diabetic chronic kidney disease: Secondary | ICD-10-CM | POA: Diagnosis not present

## 2016-08-01 DIAGNOSIS — S82851D Displaced trimalleolar fracture of right lower leg, subsequent encounter for closed fracture with routine healing: Secondary | ICD-10-CM | POA: Diagnosis not present

## 2016-08-01 DIAGNOSIS — Z9181 History of falling: Secondary | ICD-10-CM | POA: Diagnosis not present

## 2016-08-01 DIAGNOSIS — I480 Paroxysmal atrial fibrillation: Secondary | ICD-10-CM | POA: Diagnosis not present

## 2016-08-01 DIAGNOSIS — N184 Chronic kidney disease, stage 4 (severe): Secondary | ICD-10-CM | POA: Diagnosis not present

## 2016-08-02 DIAGNOSIS — I480 Paroxysmal atrial fibrillation: Secondary | ICD-10-CM | POA: Diagnosis not present

## 2016-08-02 DIAGNOSIS — I129 Hypertensive chronic kidney disease with stage 1 through stage 4 chronic kidney disease, or unspecified chronic kidney disease: Secondary | ICD-10-CM | POA: Diagnosis not present

## 2016-08-02 DIAGNOSIS — Z9181 History of falling: Secondary | ICD-10-CM | POA: Diagnosis not present

## 2016-08-02 DIAGNOSIS — S82851D Displaced trimalleolar fracture of right lower leg, subsequent encounter for closed fracture with routine healing: Secondary | ICD-10-CM | POA: Diagnosis not present

## 2016-08-02 DIAGNOSIS — N184 Chronic kidney disease, stage 4 (severe): Secondary | ICD-10-CM | POA: Diagnosis not present

## 2016-08-02 DIAGNOSIS — E1122 Type 2 diabetes mellitus with diabetic chronic kidney disease: Secondary | ICD-10-CM | POA: Diagnosis not present

## 2016-08-04 DIAGNOSIS — Z9181 History of falling: Secondary | ICD-10-CM | POA: Diagnosis not present

## 2016-08-04 DIAGNOSIS — I129 Hypertensive chronic kidney disease with stage 1 through stage 4 chronic kidney disease, or unspecified chronic kidney disease: Secondary | ICD-10-CM | POA: Diagnosis not present

## 2016-08-04 DIAGNOSIS — S82851D Displaced trimalleolar fracture of right lower leg, subsequent encounter for closed fracture with routine healing: Secondary | ICD-10-CM | POA: Diagnosis not present

## 2016-08-04 DIAGNOSIS — E1122 Type 2 diabetes mellitus with diabetic chronic kidney disease: Secondary | ICD-10-CM | POA: Diagnosis not present

## 2016-08-04 DIAGNOSIS — N184 Chronic kidney disease, stage 4 (severe): Secondary | ICD-10-CM | POA: Diagnosis not present

## 2016-08-04 DIAGNOSIS — I480 Paroxysmal atrial fibrillation: Secondary | ICD-10-CM | POA: Diagnosis not present

## 2016-08-07 DIAGNOSIS — S82851D Displaced trimalleolar fracture of right lower leg, subsequent encounter for closed fracture with routine healing: Secondary | ICD-10-CM | POA: Diagnosis not present

## 2016-08-07 DIAGNOSIS — S93311D Subluxation of tarsal joint of right foot, subsequent encounter: Secondary | ICD-10-CM | POA: Diagnosis not present

## 2016-08-08 DIAGNOSIS — N184 Chronic kidney disease, stage 4 (severe): Secondary | ICD-10-CM | POA: Diagnosis not present

## 2016-08-08 DIAGNOSIS — Z9181 History of falling: Secondary | ICD-10-CM | POA: Diagnosis not present

## 2016-08-08 DIAGNOSIS — I129 Hypertensive chronic kidney disease with stage 1 through stage 4 chronic kidney disease, or unspecified chronic kidney disease: Secondary | ICD-10-CM | POA: Diagnosis not present

## 2016-08-08 DIAGNOSIS — S82851D Displaced trimalleolar fracture of right lower leg, subsequent encounter for closed fracture with routine healing: Secondary | ICD-10-CM | POA: Diagnosis not present

## 2016-08-08 DIAGNOSIS — E1122 Type 2 diabetes mellitus with diabetic chronic kidney disease: Secondary | ICD-10-CM | POA: Diagnosis not present

## 2016-08-08 DIAGNOSIS — I480 Paroxysmal atrial fibrillation: Secondary | ICD-10-CM | POA: Diagnosis not present

## 2016-08-09 DIAGNOSIS — S82851D Displaced trimalleolar fracture of right lower leg, subsequent encounter for closed fracture with routine healing: Secondary | ICD-10-CM | POA: Diagnosis not present

## 2016-08-09 DIAGNOSIS — N184 Chronic kidney disease, stage 4 (severe): Secondary | ICD-10-CM | POA: Diagnosis not present

## 2016-08-09 DIAGNOSIS — Z9181 History of falling: Secondary | ICD-10-CM | POA: Diagnosis not present

## 2016-08-09 DIAGNOSIS — I480 Paroxysmal atrial fibrillation: Secondary | ICD-10-CM | POA: Diagnosis not present

## 2016-08-09 DIAGNOSIS — E1122 Type 2 diabetes mellitus with diabetic chronic kidney disease: Secondary | ICD-10-CM | POA: Diagnosis not present

## 2016-08-09 DIAGNOSIS — I129 Hypertensive chronic kidney disease with stage 1 through stage 4 chronic kidney disease, or unspecified chronic kidney disease: Secondary | ICD-10-CM | POA: Diagnosis not present

## 2016-08-10 DIAGNOSIS — N184 Chronic kidney disease, stage 4 (severe): Secondary | ICD-10-CM | POA: Diagnosis not present

## 2016-08-10 DIAGNOSIS — E1122 Type 2 diabetes mellitus with diabetic chronic kidney disease: Secondary | ICD-10-CM | POA: Diagnosis not present

## 2016-08-10 DIAGNOSIS — S82851D Displaced trimalleolar fracture of right lower leg, subsequent encounter for closed fracture with routine healing: Secondary | ICD-10-CM | POA: Diagnosis not present

## 2016-08-10 DIAGNOSIS — I480 Paroxysmal atrial fibrillation: Secondary | ICD-10-CM | POA: Diagnosis not present

## 2016-08-10 DIAGNOSIS — Z9181 History of falling: Secondary | ICD-10-CM | POA: Diagnosis not present

## 2016-08-10 DIAGNOSIS — I129 Hypertensive chronic kidney disease with stage 1 through stage 4 chronic kidney disease, or unspecified chronic kidney disease: Secondary | ICD-10-CM | POA: Diagnosis not present

## 2016-08-11 DIAGNOSIS — I129 Hypertensive chronic kidney disease with stage 1 through stage 4 chronic kidney disease, or unspecified chronic kidney disease: Secondary | ICD-10-CM | POA: Diagnosis not present

## 2016-08-11 DIAGNOSIS — T148XXA Other injury of unspecified body region, initial encounter: Secondary | ICD-10-CM | POA: Diagnosis not present

## 2016-08-11 DIAGNOSIS — I480 Paroxysmal atrial fibrillation: Secondary | ICD-10-CM | POA: Diagnosis not present

## 2016-08-11 DIAGNOSIS — K51919 Ulcerative colitis, unspecified with unspecified complications: Secondary | ICD-10-CM | POA: Diagnosis not present

## 2016-08-11 DIAGNOSIS — R262 Difficulty in walking, not elsewhere classified: Secondary | ICD-10-CM | POA: Diagnosis not present

## 2016-08-11 DIAGNOSIS — M6281 Muscle weakness (generalized): Secondary | ICD-10-CM | POA: Diagnosis not present

## 2016-08-11 DIAGNOSIS — E1122 Type 2 diabetes mellitus with diabetic chronic kidney disease: Secondary | ICD-10-CM | POA: Diagnosis not present

## 2016-08-11 DIAGNOSIS — Z4789 Encounter for other orthopedic aftercare: Secondary | ICD-10-CM | POA: Diagnosis not present

## 2016-08-11 DIAGNOSIS — Z9181 History of falling: Secondary | ICD-10-CM | POA: Diagnosis not present

## 2016-08-11 DIAGNOSIS — N184 Chronic kidney disease, stage 4 (severe): Secondary | ICD-10-CM | POA: Diagnosis not present

## 2016-08-11 DIAGNOSIS — S82851D Displaced trimalleolar fracture of right lower leg, subsequent encounter for closed fracture with routine healing: Secondary | ICD-10-CM | POA: Diagnosis not present

## 2016-08-14 DIAGNOSIS — Z9181 History of falling: Secondary | ICD-10-CM | POA: Diagnosis not present

## 2016-08-14 DIAGNOSIS — I480 Paroxysmal atrial fibrillation: Secondary | ICD-10-CM | POA: Diagnosis not present

## 2016-08-14 DIAGNOSIS — S82851D Displaced trimalleolar fracture of right lower leg, subsequent encounter for closed fracture with routine healing: Secondary | ICD-10-CM | POA: Diagnosis not present

## 2016-08-14 DIAGNOSIS — E1122 Type 2 diabetes mellitus with diabetic chronic kidney disease: Secondary | ICD-10-CM | POA: Diagnosis not present

## 2016-08-14 DIAGNOSIS — I129 Hypertensive chronic kidney disease with stage 1 through stage 4 chronic kidney disease, or unspecified chronic kidney disease: Secondary | ICD-10-CM | POA: Diagnosis not present

## 2016-08-14 DIAGNOSIS — N184 Chronic kidney disease, stage 4 (severe): Secondary | ICD-10-CM | POA: Diagnosis not present

## 2016-08-16 DIAGNOSIS — N184 Chronic kidney disease, stage 4 (severe): Secondary | ICD-10-CM | POA: Diagnosis not present

## 2016-08-16 DIAGNOSIS — S82851D Displaced trimalleolar fracture of right lower leg, subsequent encounter for closed fracture with routine healing: Secondary | ICD-10-CM | POA: Diagnosis not present

## 2016-08-16 DIAGNOSIS — E1122 Type 2 diabetes mellitus with diabetic chronic kidney disease: Secondary | ICD-10-CM | POA: Diagnosis not present

## 2016-08-16 DIAGNOSIS — I480 Paroxysmal atrial fibrillation: Secondary | ICD-10-CM | POA: Diagnosis not present

## 2016-08-16 DIAGNOSIS — I129 Hypertensive chronic kidney disease with stage 1 through stage 4 chronic kidney disease, or unspecified chronic kidney disease: Secondary | ICD-10-CM | POA: Diagnosis not present

## 2016-08-16 DIAGNOSIS — Z9181 History of falling: Secondary | ICD-10-CM | POA: Diagnosis not present

## 2016-08-18 DIAGNOSIS — S82851D Displaced trimalleolar fracture of right lower leg, subsequent encounter for closed fracture with routine healing: Secondary | ICD-10-CM | POA: Diagnosis not present

## 2016-08-18 DIAGNOSIS — E1122 Type 2 diabetes mellitus with diabetic chronic kidney disease: Secondary | ICD-10-CM | POA: Diagnosis not present

## 2016-08-18 DIAGNOSIS — Z9181 History of falling: Secondary | ICD-10-CM | POA: Diagnosis not present

## 2016-08-18 DIAGNOSIS — I480 Paroxysmal atrial fibrillation: Secondary | ICD-10-CM | POA: Diagnosis not present

## 2016-08-18 DIAGNOSIS — N184 Chronic kidney disease, stage 4 (severe): Secondary | ICD-10-CM | POA: Diagnosis not present

## 2016-08-18 DIAGNOSIS — I129 Hypertensive chronic kidney disease with stage 1 through stage 4 chronic kidney disease, or unspecified chronic kidney disease: Secondary | ICD-10-CM | POA: Diagnosis not present

## 2016-08-23 DIAGNOSIS — Z9181 History of falling: Secondary | ICD-10-CM | POA: Diagnosis not present

## 2016-08-23 DIAGNOSIS — E1122 Type 2 diabetes mellitus with diabetic chronic kidney disease: Secondary | ICD-10-CM | POA: Diagnosis not present

## 2016-08-23 DIAGNOSIS — I480 Paroxysmal atrial fibrillation: Secondary | ICD-10-CM | POA: Diagnosis not present

## 2016-08-23 DIAGNOSIS — I129 Hypertensive chronic kidney disease with stage 1 through stage 4 chronic kidney disease, or unspecified chronic kidney disease: Secondary | ICD-10-CM | POA: Diagnosis not present

## 2016-08-23 DIAGNOSIS — S82851D Displaced trimalleolar fracture of right lower leg, subsequent encounter for closed fracture with routine healing: Secondary | ICD-10-CM | POA: Diagnosis not present

## 2016-08-23 DIAGNOSIS — N184 Chronic kidney disease, stage 4 (severe): Secondary | ICD-10-CM | POA: Diagnosis not present

## 2016-08-25 DIAGNOSIS — I480 Paroxysmal atrial fibrillation: Secondary | ICD-10-CM | POA: Diagnosis not present

## 2016-08-25 DIAGNOSIS — S82851D Displaced trimalleolar fracture of right lower leg, subsequent encounter for closed fracture with routine healing: Secondary | ICD-10-CM | POA: Diagnosis not present

## 2016-08-25 DIAGNOSIS — I129 Hypertensive chronic kidney disease with stage 1 through stage 4 chronic kidney disease, or unspecified chronic kidney disease: Secondary | ICD-10-CM | POA: Diagnosis not present

## 2016-08-25 DIAGNOSIS — N184 Chronic kidney disease, stage 4 (severe): Secondary | ICD-10-CM | POA: Diagnosis not present

## 2016-08-25 DIAGNOSIS — E1122 Type 2 diabetes mellitus with diabetic chronic kidney disease: Secondary | ICD-10-CM | POA: Diagnosis not present

## 2016-08-25 DIAGNOSIS — Z9181 History of falling: Secondary | ICD-10-CM | POA: Diagnosis not present

## 2016-08-28 DIAGNOSIS — S82851D Displaced trimalleolar fracture of right lower leg, subsequent encounter for closed fracture with routine healing: Secondary | ICD-10-CM | POA: Diagnosis not present

## 2016-08-28 DIAGNOSIS — I480 Paroxysmal atrial fibrillation: Secondary | ICD-10-CM | POA: Diagnosis not present

## 2016-08-28 DIAGNOSIS — I129 Hypertensive chronic kidney disease with stage 1 through stage 4 chronic kidney disease, or unspecified chronic kidney disease: Secondary | ICD-10-CM | POA: Diagnosis not present

## 2016-08-28 DIAGNOSIS — N184 Chronic kidney disease, stage 4 (severe): Secondary | ICD-10-CM | POA: Diagnosis not present

## 2016-08-28 DIAGNOSIS — E1122 Type 2 diabetes mellitus with diabetic chronic kidney disease: Secondary | ICD-10-CM | POA: Diagnosis not present

## 2016-08-28 DIAGNOSIS — Z9181 History of falling: Secondary | ICD-10-CM | POA: Diagnosis not present

## 2016-08-30 DIAGNOSIS — S93311D Subluxation of tarsal joint of right foot, subsequent encounter: Secondary | ICD-10-CM | POA: Diagnosis not present

## 2016-08-30 DIAGNOSIS — S82851D Displaced trimalleolar fracture of right lower leg, subsequent encounter for closed fracture with routine healing: Secondary | ICD-10-CM | POA: Diagnosis not present

## 2016-08-31 DIAGNOSIS — I129 Hypertensive chronic kidney disease with stage 1 through stage 4 chronic kidney disease, or unspecified chronic kidney disease: Secondary | ICD-10-CM | POA: Diagnosis not present

## 2016-08-31 DIAGNOSIS — Z9181 History of falling: Secondary | ICD-10-CM | POA: Diagnosis not present

## 2016-08-31 DIAGNOSIS — I480 Paroxysmal atrial fibrillation: Secondary | ICD-10-CM | POA: Diagnosis not present

## 2016-08-31 DIAGNOSIS — N184 Chronic kidney disease, stage 4 (severe): Secondary | ICD-10-CM | POA: Diagnosis not present

## 2016-08-31 DIAGNOSIS — E1122 Type 2 diabetes mellitus with diabetic chronic kidney disease: Secondary | ICD-10-CM | POA: Diagnosis not present

## 2016-08-31 DIAGNOSIS — S82851D Displaced trimalleolar fracture of right lower leg, subsequent encounter for closed fracture with routine healing: Secondary | ICD-10-CM | POA: Diagnosis not present

## 2016-09-01 DIAGNOSIS — I480 Paroxysmal atrial fibrillation: Secondary | ICD-10-CM | POA: Diagnosis not present

## 2016-09-01 DIAGNOSIS — N184 Chronic kidney disease, stage 4 (severe): Secondary | ICD-10-CM | POA: Diagnosis not present

## 2016-09-01 DIAGNOSIS — I129 Hypertensive chronic kidney disease with stage 1 through stage 4 chronic kidney disease, or unspecified chronic kidney disease: Secondary | ICD-10-CM | POA: Diagnosis not present

## 2016-09-01 DIAGNOSIS — Z9181 History of falling: Secondary | ICD-10-CM | POA: Diagnosis not present

## 2016-09-01 DIAGNOSIS — E1122 Type 2 diabetes mellitus with diabetic chronic kidney disease: Secondary | ICD-10-CM | POA: Diagnosis not present

## 2016-09-01 DIAGNOSIS — S82851D Displaced trimalleolar fracture of right lower leg, subsequent encounter for closed fracture with routine healing: Secondary | ICD-10-CM | POA: Diagnosis not present

## 2016-09-05 DIAGNOSIS — I129 Hypertensive chronic kidney disease with stage 1 through stage 4 chronic kidney disease, or unspecified chronic kidney disease: Secondary | ICD-10-CM | POA: Diagnosis not present

## 2016-09-05 DIAGNOSIS — N184 Chronic kidney disease, stage 4 (severe): Secondary | ICD-10-CM | POA: Diagnosis not present

## 2016-09-05 DIAGNOSIS — E1122 Type 2 diabetes mellitus with diabetic chronic kidney disease: Secondary | ICD-10-CM | POA: Diagnosis not present

## 2016-09-05 DIAGNOSIS — S82851D Displaced trimalleolar fracture of right lower leg, subsequent encounter for closed fracture with routine healing: Secondary | ICD-10-CM | POA: Diagnosis not present

## 2016-09-05 DIAGNOSIS — I480 Paroxysmal atrial fibrillation: Secondary | ICD-10-CM | POA: Diagnosis not present

## 2016-09-05 DIAGNOSIS — Z9181 History of falling: Secondary | ICD-10-CM | POA: Diagnosis not present

## 2016-09-06 DIAGNOSIS — I480 Paroxysmal atrial fibrillation: Secondary | ICD-10-CM | POA: Diagnosis not present

## 2016-09-06 DIAGNOSIS — N184 Chronic kidney disease, stage 4 (severe): Secondary | ICD-10-CM | POA: Diagnosis not present

## 2016-09-06 DIAGNOSIS — Z9181 History of falling: Secondary | ICD-10-CM | POA: Diagnosis not present

## 2016-09-06 DIAGNOSIS — I129 Hypertensive chronic kidney disease with stage 1 through stage 4 chronic kidney disease, or unspecified chronic kidney disease: Secondary | ICD-10-CM | POA: Diagnosis not present

## 2016-09-06 DIAGNOSIS — S82851D Displaced trimalleolar fracture of right lower leg, subsequent encounter for closed fracture with routine healing: Secondary | ICD-10-CM | POA: Diagnosis not present

## 2016-09-06 DIAGNOSIS — E1122 Type 2 diabetes mellitus with diabetic chronic kidney disease: Secondary | ICD-10-CM | POA: Diagnosis not present

## 2016-09-07 DIAGNOSIS — L03116 Cellulitis of left lower limb: Secondary | ICD-10-CM | POA: Diagnosis not present

## 2016-09-07 DIAGNOSIS — R600 Localized edema: Secondary | ICD-10-CM | POA: Diagnosis not present

## 2016-09-07 DIAGNOSIS — M79669 Pain in unspecified lower leg: Secondary | ICD-10-CM | POA: Diagnosis not present

## 2016-09-07 DIAGNOSIS — I10 Essential (primary) hypertension: Secondary | ICD-10-CM | POA: Diagnosis not present

## 2016-09-07 DIAGNOSIS — E669 Obesity, unspecified: Secondary | ICD-10-CM | POA: Diagnosis not present

## 2016-09-07 DIAGNOSIS — Z79899 Other long term (current) drug therapy: Secondary | ICD-10-CM | POA: Diagnosis not present

## 2016-09-07 DIAGNOSIS — Z6830 Body mass index (BMI) 30.0-30.9, adult: Secondary | ICD-10-CM | POA: Diagnosis not present

## 2016-09-08 DIAGNOSIS — E1122 Type 2 diabetes mellitus with diabetic chronic kidney disease: Secondary | ICD-10-CM | POA: Diagnosis not present

## 2016-09-08 DIAGNOSIS — I480 Paroxysmal atrial fibrillation: Secondary | ICD-10-CM | POA: Diagnosis not present

## 2016-09-08 DIAGNOSIS — N184 Chronic kidney disease, stage 4 (severe): Secondary | ICD-10-CM | POA: Diagnosis not present

## 2016-09-08 DIAGNOSIS — Z9181 History of falling: Secondary | ICD-10-CM | POA: Diagnosis not present

## 2016-09-08 DIAGNOSIS — S82851D Displaced trimalleolar fracture of right lower leg, subsequent encounter for closed fracture with routine healing: Secondary | ICD-10-CM | POA: Diagnosis not present

## 2016-09-08 DIAGNOSIS — I129 Hypertensive chronic kidney disease with stage 1 through stage 4 chronic kidney disease, or unspecified chronic kidney disease: Secondary | ICD-10-CM | POA: Diagnosis not present

## 2016-09-14 DIAGNOSIS — I10 Essential (primary) hypertension: Secondary | ICD-10-CM | POA: Diagnosis not present

## 2016-09-14 DIAGNOSIS — E663 Overweight: Secondary | ICD-10-CM | POA: Diagnosis not present

## 2016-09-14 DIAGNOSIS — Z6829 Body mass index (BMI) 29.0-29.9, adult: Secondary | ICD-10-CM | POA: Diagnosis not present

## 2016-09-14 DIAGNOSIS — E559 Vitamin D deficiency, unspecified: Secondary | ICD-10-CM | POA: Diagnosis not present

## 2016-09-14 DIAGNOSIS — Z79899 Other long term (current) drug therapy: Secondary | ICD-10-CM | POA: Diagnosis not present

## 2016-09-14 DIAGNOSIS — M79669 Pain in unspecified lower leg: Secondary | ICD-10-CM | POA: Diagnosis not present

## 2016-10-11 DIAGNOSIS — R609 Edema, unspecified: Secondary | ICD-10-CM | POA: Diagnosis not present

## 2016-10-11 DIAGNOSIS — E669 Obesity, unspecified: Secondary | ICD-10-CM | POA: Diagnosis not present

## 2016-10-11 DIAGNOSIS — Z6830 Body mass index (BMI) 30.0-30.9, adult: Secondary | ICD-10-CM | POA: Diagnosis not present

## 2016-11-14 DIAGNOSIS — R609 Edema, unspecified: Secondary | ICD-10-CM | POA: Diagnosis not present

## 2016-11-14 DIAGNOSIS — E663 Overweight: Secondary | ICD-10-CM | POA: Diagnosis not present

## 2016-11-14 DIAGNOSIS — Z6829 Body mass index (BMI) 29.0-29.9, adult: Secondary | ICD-10-CM | POA: Diagnosis not present

## 2016-11-16 DIAGNOSIS — M25562 Pain in left knee: Secondary | ICD-10-CM | POA: Diagnosis not present

## 2016-11-16 DIAGNOSIS — M25561 Pain in right knee: Secondary | ICD-10-CM | POA: Diagnosis not present

## 2016-11-16 DIAGNOSIS — M1712 Unilateral primary osteoarthritis, left knee: Secondary | ICD-10-CM | POA: Diagnosis not present

## 2016-11-16 DIAGNOSIS — M1711 Unilateral primary osteoarthritis, right knee: Secondary | ICD-10-CM | POA: Diagnosis not present

## 2016-12-28 DIAGNOSIS — Z79899 Other long term (current) drug therapy: Secondary | ICD-10-CM | POA: Diagnosis not present

## 2016-12-28 DIAGNOSIS — E1121 Type 2 diabetes mellitus with diabetic nephropathy: Secondary | ICD-10-CM | POA: Diagnosis not present

## 2016-12-28 DIAGNOSIS — I10 Essential (primary) hypertension: Secondary | ICD-10-CM | POA: Diagnosis not present

## 2016-12-28 DIAGNOSIS — Z6829 Body mass index (BMI) 29.0-29.9, adult: Secondary | ICD-10-CM | POA: Diagnosis not present

## 2016-12-28 DIAGNOSIS — N183 Chronic kidney disease, stage 3 (moderate): Secondary | ICD-10-CM | POA: Diagnosis not present

## 2016-12-28 DIAGNOSIS — E782 Mixed hyperlipidemia: Secondary | ICD-10-CM | POA: Diagnosis not present

## 2017-01-23 DIAGNOSIS — M545 Low back pain: Secondary | ICD-10-CM | POA: Diagnosis not present

## 2017-03-06 DIAGNOSIS — Z23 Encounter for immunization: Secondary | ICD-10-CM | POA: Diagnosis not present

## 2017-03-06 DIAGNOSIS — L84 Corns and callosities: Secondary | ICD-10-CM | POA: Diagnosis not present

## 2017-03-06 DIAGNOSIS — Z6829 Body mass index (BMI) 29.0-29.9, adult: Secondary | ICD-10-CM | POA: Diagnosis not present

## 2017-03-14 DIAGNOSIS — H5203 Hypermetropia, bilateral: Secondary | ICD-10-CM | POA: Diagnosis not present

## 2017-03-14 DIAGNOSIS — E119 Type 2 diabetes mellitus without complications: Secondary | ICD-10-CM | POA: Diagnosis not present

## 2017-03-14 DIAGNOSIS — H52223 Regular astigmatism, bilateral: Secondary | ICD-10-CM | POA: Diagnosis not present

## 2017-03-14 DIAGNOSIS — H47331 Pseudopapilledema of optic disc, right eye: Secondary | ICD-10-CM | POA: Diagnosis not present

## 2017-03-21 DIAGNOSIS — L84 Corns and callosities: Secondary | ICD-10-CM | POA: Diagnosis not present

## 2017-03-21 DIAGNOSIS — E119 Type 2 diabetes mellitus without complications: Secondary | ICD-10-CM | POA: Diagnosis not present

## 2017-03-21 DIAGNOSIS — M21961 Unspecified acquired deformity of right lower leg: Secondary | ICD-10-CM | POA: Diagnosis not present

## 2017-03-21 DIAGNOSIS — M2041 Other hammer toe(s) (acquired), right foot: Secondary | ICD-10-CM | POA: Diagnosis not present

## 2017-03-21 DIAGNOSIS — M2042 Other hammer toe(s) (acquired), left foot: Secondary | ICD-10-CM | POA: Diagnosis not present

## 2017-04-03 DIAGNOSIS — N189 Chronic kidney disease, unspecified: Secondary | ICD-10-CM | POA: Diagnosis not present

## 2017-04-03 DIAGNOSIS — I10 Essential (primary) hypertension: Secondary | ICD-10-CM | POA: Diagnosis not present

## 2017-04-03 DIAGNOSIS — E782 Mixed hyperlipidemia: Secondary | ICD-10-CM | POA: Diagnosis not present

## 2017-04-03 DIAGNOSIS — N183 Chronic kidney disease, stage 3 (moderate): Secondary | ICD-10-CM | POA: Diagnosis not present

## 2017-04-03 DIAGNOSIS — K529 Noninfective gastroenteritis and colitis, unspecified: Secondary | ICD-10-CM | POA: Diagnosis not present

## 2017-04-03 DIAGNOSIS — N400 Enlarged prostate without lower urinary tract symptoms: Secondary | ICD-10-CM | POA: Diagnosis not present

## 2017-04-03 DIAGNOSIS — K219 Gastro-esophageal reflux disease without esophagitis: Secondary | ICD-10-CM | POA: Diagnosis not present

## 2017-04-03 DIAGNOSIS — E1121 Type 2 diabetes mellitus with diabetic nephropathy: Secondary | ICD-10-CM | POA: Diagnosis not present

## 2017-04-03 DIAGNOSIS — I4891 Unspecified atrial fibrillation: Secondary | ICD-10-CM | POA: Diagnosis not present

## 2017-04-16 DIAGNOSIS — R609 Edema, unspecified: Secondary | ICD-10-CM | POA: Diagnosis not present

## 2017-04-16 DIAGNOSIS — Z6829 Body mass index (BMI) 29.0-29.9, adult: Secondary | ICD-10-CM | POA: Diagnosis not present

## 2017-05-16 DIAGNOSIS — R609 Edema, unspecified: Secondary | ICD-10-CM | POA: Diagnosis not present

## 2017-05-16 DIAGNOSIS — Z6828 Body mass index (BMI) 28.0-28.9, adult: Secondary | ICD-10-CM | POA: Diagnosis not present

## 2017-05-16 DIAGNOSIS — E663 Overweight: Secondary | ICD-10-CM | POA: Diagnosis not present

## 2017-05-16 DIAGNOSIS — Z1331 Encounter for screening for depression: Secondary | ICD-10-CM | POA: Diagnosis not present

## 2017-07-11 DIAGNOSIS — K219 Gastro-esophageal reflux disease without esophagitis: Secondary | ICD-10-CM | POA: Diagnosis not present

## 2017-07-11 DIAGNOSIS — E782 Mixed hyperlipidemia: Secondary | ICD-10-CM | POA: Diagnosis not present

## 2017-07-11 DIAGNOSIS — I10 Essential (primary) hypertension: Secondary | ICD-10-CM | POA: Diagnosis not present

## 2017-07-11 DIAGNOSIS — K529 Noninfective gastroenteritis and colitis, unspecified: Secondary | ICD-10-CM | POA: Diagnosis not present

## 2017-07-11 DIAGNOSIS — N189 Chronic kidney disease, unspecified: Secondary | ICD-10-CM | POA: Diagnosis not present

## 2017-07-11 DIAGNOSIS — I4891 Unspecified atrial fibrillation: Secondary | ICD-10-CM | POA: Diagnosis not present

## 2017-07-11 DIAGNOSIS — Z9181 History of falling: Secondary | ICD-10-CM | POA: Diagnosis not present

## 2017-07-11 DIAGNOSIS — N183 Chronic kidney disease, stage 3 (moderate): Secondary | ICD-10-CM | POA: Diagnosis not present

## 2017-07-11 DIAGNOSIS — E1121 Type 2 diabetes mellitus with diabetic nephropathy: Secondary | ICD-10-CM | POA: Diagnosis not present

## 2017-10-09 DIAGNOSIS — N400 Enlarged prostate without lower urinary tract symptoms: Secondary | ICD-10-CM | POA: Diagnosis not present

## 2017-10-09 DIAGNOSIS — I4891 Unspecified atrial fibrillation: Secondary | ICD-10-CM | POA: Diagnosis not present

## 2017-10-09 DIAGNOSIS — E1121 Type 2 diabetes mellitus with diabetic nephropathy: Secondary | ICD-10-CM | POA: Diagnosis not present

## 2017-10-09 DIAGNOSIS — E782 Mixed hyperlipidemia: Secondary | ICD-10-CM | POA: Diagnosis not present

## 2017-10-09 DIAGNOSIS — M79669 Pain in unspecified lower leg: Secondary | ICD-10-CM | POA: Diagnosis not present

## 2017-10-09 DIAGNOSIS — N183 Chronic kidney disease, stage 3 (moderate): Secondary | ICD-10-CM | POA: Diagnosis not present

## 2017-10-09 DIAGNOSIS — E559 Vitamin D deficiency, unspecified: Secondary | ICD-10-CM | POA: Diagnosis not present

## 2017-10-09 DIAGNOSIS — Z6828 Body mass index (BMI) 28.0-28.9, adult: Secondary | ICD-10-CM | POA: Diagnosis not present

## 2017-11-01 DIAGNOSIS — M25512 Pain in left shoulder: Secondary | ICD-10-CM | POA: Diagnosis not present

## 2017-11-06 DIAGNOSIS — M25512 Pain in left shoulder: Secondary | ICD-10-CM | POA: Diagnosis not present

## 2017-11-06 DIAGNOSIS — M75102 Unspecified rotator cuff tear or rupture of left shoulder, not specified as traumatic: Secondary | ICD-10-CM | POA: Diagnosis not present

## 2017-11-08 DIAGNOSIS — M19012 Primary osteoarthritis, left shoulder: Secondary | ICD-10-CM | POA: Diagnosis not present

## 2017-11-08 DIAGNOSIS — M25512 Pain in left shoulder: Secondary | ICD-10-CM | POA: Diagnosis not present

## 2017-11-14 DIAGNOSIS — R600 Localized edema: Secondary | ICD-10-CM | POA: Diagnosis not present

## 2017-11-14 DIAGNOSIS — Z6828 Body mass index (BMI) 28.0-28.9, adult: Secondary | ICD-10-CM | POA: Diagnosis not present

## 2017-11-21 DIAGNOSIS — Z6828 Body mass index (BMI) 28.0-28.9, adult: Secondary | ICD-10-CM | POA: Diagnosis not present

## 2017-11-21 DIAGNOSIS — K5900 Constipation, unspecified: Secondary | ICD-10-CM | POA: Diagnosis not present

## 2017-11-21 DIAGNOSIS — M79669 Pain in unspecified lower leg: Secondary | ICD-10-CM | POA: Diagnosis not present

## 2017-11-28 DIAGNOSIS — M79669 Pain in unspecified lower leg: Secondary | ICD-10-CM | POA: Diagnosis not present

## 2017-11-28 DIAGNOSIS — Z6828 Body mass index (BMI) 28.0-28.9, adult: Secondary | ICD-10-CM | POA: Diagnosis not present

## 2017-12-05 DIAGNOSIS — L03119 Cellulitis of unspecified part of limb: Secondary | ICD-10-CM | POA: Diagnosis not present

## 2017-12-05 DIAGNOSIS — Z6828 Body mass index (BMI) 28.0-28.9, adult: Secondary | ICD-10-CM | POA: Diagnosis not present

## 2017-12-14 DIAGNOSIS — Z6828 Body mass index (BMI) 28.0-28.9, adult: Secondary | ICD-10-CM | POA: Diagnosis not present

## 2017-12-14 DIAGNOSIS — L03119 Cellulitis of unspecified part of limb: Secondary | ICD-10-CM | POA: Diagnosis not present

## 2017-12-19 DIAGNOSIS — M25512 Pain in left shoulder: Secondary | ICD-10-CM | POA: Diagnosis not present

## 2017-12-20 DIAGNOSIS — M25512 Pain in left shoulder: Secondary | ICD-10-CM | POA: Diagnosis not present

## 2017-12-20 DIAGNOSIS — Z6828 Body mass index (BMI) 28.0-28.9, adult: Secondary | ICD-10-CM | POA: Diagnosis not present

## 2017-12-20 DIAGNOSIS — L03119 Cellulitis of unspecified part of limb: Secondary | ICD-10-CM | POA: Diagnosis not present

## 2017-12-20 DIAGNOSIS — M6281 Muscle weakness (generalized): Secondary | ICD-10-CM | POA: Diagnosis not present

## 2017-12-24 DIAGNOSIS — M6281 Muscle weakness (generalized): Secondary | ICD-10-CM | POA: Diagnosis not present

## 2017-12-24 DIAGNOSIS — M25512 Pain in left shoulder: Secondary | ICD-10-CM | POA: Diagnosis not present

## 2017-12-25 DIAGNOSIS — E1122 Type 2 diabetes mellitus with diabetic chronic kidney disease: Secondary | ICD-10-CM | POA: Diagnosis not present

## 2017-12-25 DIAGNOSIS — L259 Unspecified contact dermatitis, unspecified cause: Secondary | ICD-10-CM | POA: Diagnosis not present

## 2017-12-25 DIAGNOSIS — L309 Dermatitis, unspecified: Secondary | ICD-10-CM | POA: Diagnosis not present

## 2017-12-25 DIAGNOSIS — I129 Hypertensive chronic kidney disease with stage 1 through stage 4 chronic kidney disease, or unspecified chronic kidney disease: Secondary | ICD-10-CM | POA: Diagnosis not present

## 2017-12-25 DIAGNOSIS — N183 Chronic kidney disease, stage 3 (moderate): Secondary | ICD-10-CM | POA: Diagnosis not present

## 2017-12-25 DIAGNOSIS — E785 Hyperlipidemia, unspecified: Secondary | ICD-10-CM | POA: Diagnosis not present

## 2017-12-25 DIAGNOSIS — Z87891 Personal history of nicotine dependence: Secondary | ICD-10-CM | POA: Diagnosis not present

## 2017-12-25 DIAGNOSIS — I4891 Unspecified atrial fibrillation: Secondary | ICD-10-CM | POA: Diagnosis not present

## 2017-12-26 DIAGNOSIS — M6281 Muscle weakness (generalized): Secondary | ICD-10-CM | POA: Diagnosis not present

## 2017-12-26 DIAGNOSIS — M25512 Pain in left shoulder: Secondary | ICD-10-CM | POA: Diagnosis not present

## 2018-01-01 DIAGNOSIS — M6281 Muscle weakness (generalized): Secondary | ICD-10-CM | POA: Diagnosis not present

## 2018-01-01 DIAGNOSIS — L309 Dermatitis, unspecified: Secondary | ICD-10-CM | POA: Diagnosis not present

## 2018-01-01 DIAGNOSIS — M25512 Pain in left shoulder: Secondary | ICD-10-CM | POA: Diagnosis not present

## 2018-01-01 DIAGNOSIS — E119 Type 2 diabetes mellitus without complications: Secondary | ICD-10-CM | POA: Diagnosis not present

## 2018-01-02 DIAGNOSIS — M6281 Muscle weakness (generalized): Secondary | ICD-10-CM | POA: Diagnosis not present

## 2018-01-02 DIAGNOSIS — M25512 Pain in left shoulder: Secondary | ICD-10-CM | POA: Diagnosis not present

## 2018-01-07 DIAGNOSIS — E11622 Type 2 diabetes mellitus with other skin ulcer: Secondary | ICD-10-CM | POA: Diagnosis not present

## 2018-01-07 DIAGNOSIS — L97812 Non-pressure chronic ulcer of other part of right lower leg with fat layer exposed: Secondary | ICD-10-CM | POA: Diagnosis not present

## 2018-01-07 DIAGNOSIS — I87311 Chronic venous hypertension (idiopathic) with ulcer of right lower extremity: Secondary | ICD-10-CM | POA: Diagnosis not present

## 2018-01-07 DIAGNOSIS — I10 Essential (primary) hypertension: Secondary | ICD-10-CM | POA: Diagnosis not present

## 2018-01-08 DIAGNOSIS — N400 Enlarged prostate without lower urinary tract symptoms: Secondary | ICD-10-CM | POA: Diagnosis not present

## 2018-01-08 DIAGNOSIS — E782 Mixed hyperlipidemia: Secondary | ICD-10-CM | POA: Diagnosis not present

## 2018-01-08 DIAGNOSIS — M79669 Pain in unspecified lower leg: Secondary | ICD-10-CM | POA: Diagnosis not present

## 2018-01-08 DIAGNOSIS — N183 Chronic kidney disease, stage 3 (moderate): Secondary | ICD-10-CM | POA: Diagnosis not present

## 2018-01-08 DIAGNOSIS — E1121 Type 2 diabetes mellitus with diabetic nephropathy: Secondary | ICD-10-CM | POA: Diagnosis not present

## 2018-01-08 DIAGNOSIS — I4891 Unspecified atrial fibrillation: Secondary | ICD-10-CM | POA: Diagnosis not present

## 2018-01-08 DIAGNOSIS — N189 Chronic kidney disease, unspecified: Secondary | ICD-10-CM | POA: Diagnosis not present

## 2018-01-08 DIAGNOSIS — Z6828 Body mass index (BMI) 28.0-28.9, adult: Secondary | ICD-10-CM | POA: Diagnosis not present

## 2018-01-09 DIAGNOSIS — M6281 Muscle weakness (generalized): Secondary | ICD-10-CM | POA: Diagnosis not present

## 2018-01-09 DIAGNOSIS — M25512 Pain in left shoulder: Secondary | ICD-10-CM | POA: Diagnosis not present

## 2018-01-10 DIAGNOSIS — L97812 Non-pressure chronic ulcer of other part of right lower leg with fat layer exposed: Secondary | ICD-10-CM | POA: Diagnosis not present

## 2018-01-10 DIAGNOSIS — E11622 Type 2 diabetes mellitus with other skin ulcer: Secondary | ICD-10-CM | POA: Diagnosis not present

## 2018-01-14 DIAGNOSIS — I87311 Chronic venous hypertension (idiopathic) with ulcer of right lower extremity: Secondary | ICD-10-CM | POA: Diagnosis not present

## 2018-01-14 DIAGNOSIS — E11620 Type 2 diabetes mellitus with diabetic dermatitis: Secondary | ICD-10-CM | POA: Diagnosis not present

## 2018-01-14 DIAGNOSIS — L97812 Non-pressure chronic ulcer of other part of right lower leg with fat layer exposed: Secondary | ICD-10-CM | POA: Diagnosis not present

## 2018-01-14 DIAGNOSIS — E11622 Type 2 diabetes mellitus with other skin ulcer: Secondary | ICD-10-CM | POA: Diagnosis not present

## 2018-01-14 DIAGNOSIS — L97819 Non-pressure chronic ulcer of other part of right lower leg with unspecified severity: Secondary | ICD-10-CM | POA: Diagnosis not present

## 2018-01-14 DIAGNOSIS — I10 Essential (primary) hypertension: Secondary | ICD-10-CM | POA: Diagnosis not present

## 2018-01-15 DIAGNOSIS — M6281 Muscle weakness (generalized): Secondary | ICD-10-CM | POA: Diagnosis not present

## 2018-01-15 DIAGNOSIS — M25512 Pain in left shoulder: Secondary | ICD-10-CM | POA: Diagnosis not present

## 2018-01-17 DIAGNOSIS — E11622 Type 2 diabetes mellitus with other skin ulcer: Secondary | ICD-10-CM | POA: Diagnosis not present

## 2018-01-17 DIAGNOSIS — L97819 Non-pressure chronic ulcer of other part of right lower leg with unspecified severity: Secondary | ICD-10-CM | POA: Diagnosis not present

## 2018-01-22 DIAGNOSIS — Z09 Encounter for follow-up examination after completed treatment for conditions other than malignant neoplasm: Secondary | ICD-10-CM | POA: Diagnosis not present

## 2018-01-22 DIAGNOSIS — L309 Dermatitis, unspecified: Secondary | ICD-10-CM | POA: Diagnosis not present

## 2018-01-22 DIAGNOSIS — M25512 Pain in left shoulder: Secondary | ICD-10-CM | POA: Diagnosis not present

## 2018-01-22 DIAGNOSIS — L97819 Non-pressure chronic ulcer of other part of right lower leg with unspecified severity: Secondary | ICD-10-CM | POA: Diagnosis not present

## 2018-01-22 DIAGNOSIS — I872 Venous insufficiency (chronic) (peripheral): Secondary | ICD-10-CM | POA: Diagnosis not present

## 2018-01-22 DIAGNOSIS — Z872 Personal history of diseases of the skin and subcutaneous tissue: Secondary | ICD-10-CM | POA: Diagnosis not present

## 2018-01-22 DIAGNOSIS — Z8679 Personal history of other diseases of the circulatory system: Secondary | ICD-10-CM | POA: Diagnosis not present

## 2018-01-22 DIAGNOSIS — M6281 Muscle weakness (generalized): Secondary | ICD-10-CM | POA: Diagnosis not present

## 2018-01-24 DIAGNOSIS — R001 Bradycardia, unspecified: Secondary | ICD-10-CM | POA: Diagnosis not present

## 2018-01-24 DIAGNOSIS — R030 Elevated blood-pressure reading, without diagnosis of hypertension: Secondary | ICD-10-CM | POA: Diagnosis not present

## 2018-01-24 DIAGNOSIS — Z872 Personal history of diseases of the skin and subcutaneous tissue: Secondary | ICD-10-CM | POA: Diagnosis not present

## 2018-01-24 DIAGNOSIS — L97819 Non-pressure chronic ulcer of other part of right lower leg with unspecified severity: Secondary | ICD-10-CM | POA: Diagnosis not present

## 2018-01-24 DIAGNOSIS — I87311 Chronic venous hypertension (idiopathic) with ulcer of right lower extremity: Secondary | ICD-10-CM | POA: Diagnosis not present

## 2018-01-24 DIAGNOSIS — Z8679 Personal history of other diseases of the circulatory system: Secondary | ICD-10-CM | POA: Diagnosis not present

## 2018-01-24 DIAGNOSIS — M25512 Pain in left shoulder: Secondary | ICD-10-CM | POA: Diagnosis not present

## 2018-01-24 DIAGNOSIS — Z09 Encounter for follow-up examination after completed treatment for conditions other than malignant neoplasm: Secondary | ICD-10-CM | POA: Diagnosis not present

## 2018-01-24 DIAGNOSIS — M6281 Muscle weakness (generalized): Secondary | ICD-10-CM | POA: Diagnosis not present

## 2018-01-29 DIAGNOSIS — M6281 Muscle weakness (generalized): Secondary | ICD-10-CM | POA: Diagnosis not present

## 2018-01-29 DIAGNOSIS — M25512 Pain in left shoulder: Secondary | ICD-10-CM | POA: Diagnosis not present

## 2018-01-31 DIAGNOSIS — M25512 Pain in left shoulder: Secondary | ICD-10-CM | POA: Diagnosis not present

## 2018-01-31 DIAGNOSIS — M6281 Muscle weakness (generalized): Secondary | ICD-10-CM | POA: Diagnosis not present

## 2018-02-05 DIAGNOSIS — M6281 Muscle weakness (generalized): Secondary | ICD-10-CM | POA: Diagnosis not present

## 2018-02-05 DIAGNOSIS — M25512 Pain in left shoulder: Secondary | ICD-10-CM | POA: Diagnosis not present

## 2018-02-06 DIAGNOSIS — M19012 Primary osteoarthritis, left shoulder: Secondary | ICD-10-CM | POA: Diagnosis not present

## 2018-03-06 DIAGNOSIS — M19012 Primary osteoarthritis, left shoulder: Secondary | ICD-10-CM | POA: Diagnosis not present

## 2018-03-06 DIAGNOSIS — M25512 Pain in left shoulder: Secondary | ICD-10-CM | POA: Diagnosis not present

## 2018-03-12 DIAGNOSIS — M150 Primary generalized (osteo)arthritis: Secondary | ICD-10-CM | POA: Diagnosis not present

## 2018-03-12 DIAGNOSIS — E782 Mixed hyperlipidemia: Secondary | ICD-10-CM | POA: Diagnosis not present

## 2018-03-12 DIAGNOSIS — D649 Anemia, unspecified: Secondary | ICD-10-CM | POA: Diagnosis not present

## 2018-03-12 DIAGNOSIS — N400 Enlarged prostate without lower urinary tract symptoms: Secondary | ICD-10-CM | POA: Diagnosis not present

## 2018-03-12 DIAGNOSIS — Z23 Encounter for immunization: Secondary | ICD-10-CM | POA: Diagnosis not present

## 2018-03-12 DIAGNOSIS — E1169 Type 2 diabetes mellitus with other specified complication: Secondary | ICD-10-CM | POA: Diagnosis not present

## 2018-03-12 DIAGNOSIS — I10 Essential (primary) hypertension: Secondary | ICD-10-CM | POA: Diagnosis not present

## 2018-03-12 DIAGNOSIS — K219 Gastro-esophageal reflux disease without esophagitis: Secondary | ICD-10-CM | POA: Diagnosis not present

## 2018-03-12 DIAGNOSIS — R251 Tremor, unspecified: Secondary | ICD-10-CM | POA: Diagnosis not present

## 2018-03-19 DIAGNOSIS — C44311 Basal cell carcinoma of skin of nose: Secondary | ICD-10-CM | POA: Diagnosis not present

## 2018-03-19 DIAGNOSIS — D2239 Melanocytic nevi of other parts of face: Secondary | ICD-10-CM | POA: Diagnosis not present

## 2018-03-19 DIAGNOSIS — C441122 Basal cell carcinoma of skin of right lower eyelid, including canthus: Secondary | ICD-10-CM | POA: Diagnosis not present

## 2018-03-23 DIAGNOSIS — E870 Hyperosmolality and hypernatremia: Secondary | ICD-10-CM | POA: Diagnosis not present

## 2018-03-23 DIAGNOSIS — M1611 Unilateral primary osteoarthritis, right hip: Secondary | ICD-10-CM | POA: Diagnosis not present

## 2018-03-23 DIAGNOSIS — E1122 Type 2 diabetes mellitus with diabetic chronic kidney disease: Secondary | ICD-10-CM | POA: Diagnosis not present

## 2018-03-23 DIAGNOSIS — W19XXXA Unspecified fall, initial encounter: Secondary | ICD-10-CM | POA: Diagnosis not present

## 2018-03-23 DIAGNOSIS — R251 Tremor, unspecified: Secondary | ICD-10-CM | POA: Diagnosis not present

## 2018-03-23 DIAGNOSIS — M25551 Pain in right hip: Secondary | ICD-10-CM | POA: Diagnosis not present

## 2018-03-23 DIAGNOSIS — S72001A Fracture of unspecified part of neck of right femur, initial encounter for closed fracture: Secondary | ICD-10-CM | POA: Diagnosis not present

## 2018-03-23 DIAGNOSIS — K219 Gastro-esophageal reflux disease without esophagitis: Secondary | ICD-10-CM | POA: Diagnosis not present

## 2018-03-23 DIAGNOSIS — E871 Hypo-osmolality and hyponatremia: Secondary | ICD-10-CM | POA: Diagnosis not present

## 2018-03-23 DIAGNOSIS — N400 Enlarged prostate without lower urinary tract symptoms: Secondary | ICD-10-CM | POA: Diagnosis not present

## 2018-03-23 DIAGNOSIS — N183 Chronic kidney disease, stage 3 (moderate): Secondary | ICD-10-CM | POA: Diagnosis not present

## 2018-03-23 DIAGNOSIS — E119 Type 2 diabetes mellitus without complications: Secondary | ICD-10-CM | POA: Diagnosis not present

## 2018-03-23 DIAGNOSIS — S72001D Fracture of unspecified part of neck of right femur, subsequent encounter for closed fracture with routine healing: Secondary | ICD-10-CM | POA: Diagnosis not present

## 2018-03-23 DIAGNOSIS — Z471 Aftercare following joint replacement surgery: Secondary | ICD-10-CM | POA: Diagnosis not present

## 2018-03-23 DIAGNOSIS — K529 Noninfective gastroenteritis and colitis, unspecified: Secondary | ICD-10-CM | POA: Diagnosis not present

## 2018-03-23 DIAGNOSIS — G20 Parkinson's disease: Secondary | ICD-10-CM | POA: Diagnosis not present

## 2018-03-23 DIAGNOSIS — Z01818 Encounter for other preprocedural examination: Secondary | ICD-10-CM | POA: Diagnosis not present

## 2018-03-23 DIAGNOSIS — M84651A Pathological fracture in other disease, right femur, initial encounter for fracture: Secondary | ICD-10-CM | POA: Diagnosis not present

## 2018-03-23 DIAGNOSIS — E7800 Pure hypercholesterolemia, unspecified: Secondary | ICD-10-CM | POA: Diagnosis not present

## 2018-03-23 DIAGNOSIS — I4891 Unspecified atrial fibrillation: Secondary | ICD-10-CM | POA: Diagnosis not present

## 2018-03-23 DIAGNOSIS — Z7401 Bed confinement status: Secondary | ICD-10-CM | POA: Diagnosis not present

## 2018-03-23 DIAGNOSIS — I10 Essential (primary) hypertension: Secondary | ICD-10-CM | POA: Diagnosis not present

## 2018-03-23 DIAGNOSIS — Z96641 Presence of right artificial hip joint: Secondary | ICD-10-CM | POA: Diagnosis not present

## 2018-03-23 DIAGNOSIS — S72009A Fracture of unspecified part of neck of unspecified femur, initial encounter for closed fracture: Secondary | ICD-10-CM | POA: Diagnosis not present

## 2018-03-23 DIAGNOSIS — R52 Pain, unspecified: Secondary | ICD-10-CM | POA: Diagnosis not present

## 2018-03-23 DIAGNOSIS — R0902 Hypoxemia: Secondary | ICD-10-CM | POA: Diagnosis not present

## 2018-03-23 DIAGNOSIS — R7989 Other specified abnormal findings of blood chemistry: Secondary | ICD-10-CM | POA: Diagnosis not present

## 2018-03-23 DIAGNOSIS — I2510 Atherosclerotic heart disease of native coronary artery without angina pectoris: Secondary | ICD-10-CM | POA: Diagnosis not present

## 2018-03-24 DIAGNOSIS — E119 Type 2 diabetes mellitus without complications: Secondary | ICD-10-CM | POA: Diagnosis not present

## 2018-03-24 DIAGNOSIS — I10 Essential (primary) hypertension: Secondary | ICD-10-CM | POA: Diagnosis not present

## 2018-03-24 DIAGNOSIS — S72009A Fracture of unspecified part of neck of unspecified femur, initial encounter for closed fracture: Secondary | ICD-10-CM | POA: Diagnosis not present

## 2018-03-24 DIAGNOSIS — R7989 Other specified abnormal findings of blood chemistry: Secondary | ICD-10-CM

## 2018-03-24 DIAGNOSIS — I4891 Unspecified atrial fibrillation: Secondary | ICD-10-CM | POA: Diagnosis not present

## 2018-03-25 DIAGNOSIS — I10 Essential (primary) hypertension: Secondary | ICD-10-CM | POA: Diagnosis not present

## 2018-03-25 DIAGNOSIS — R7989 Other specified abnormal findings of blood chemistry: Secondary | ICD-10-CM | POA: Diagnosis not present

## 2018-03-25 DIAGNOSIS — I517 Cardiomegaly: Secondary | ICD-10-CM

## 2018-03-25 DIAGNOSIS — S72009A Fracture of unspecified part of neck of unspecified femur, initial encounter for closed fracture: Secondary | ICD-10-CM | POA: Diagnosis not present

## 2018-03-25 DIAGNOSIS — I4891 Unspecified atrial fibrillation: Secondary | ICD-10-CM | POA: Diagnosis not present

## 2018-03-25 DIAGNOSIS — E119 Type 2 diabetes mellitus without complications: Secondary | ICD-10-CM | POA: Diagnosis not present

## 2018-03-26 DIAGNOSIS — E119 Type 2 diabetes mellitus without complications: Secondary | ICD-10-CM | POA: Diagnosis not present

## 2018-03-26 DIAGNOSIS — S72009A Fracture of unspecified part of neck of unspecified femur, initial encounter for closed fracture: Secondary | ICD-10-CM | POA: Diagnosis not present

## 2018-03-26 DIAGNOSIS — I10 Essential (primary) hypertension: Secondary | ICD-10-CM | POA: Diagnosis not present

## 2018-03-26 DIAGNOSIS — I4891 Unspecified atrial fibrillation: Secondary | ICD-10-CM | POA: Diagnosis not present

## 2018-03-27 DIAGNOSIS — E1122 Type 2 diabetes mellitus with diabetic chronic kidney disease: Secondary | ICD-10-CM | POA: Diagnosis not present

## 2018-03-27 DIAGNOSIS — N400 Enlarged prostate without lower urinary tract symptoms: Secondary | ICD-10-CM | POA: Diagnosis not present

## 2018-03-27 DIAGNOSIS — K529 Noninfective gastroenteritis and colitis, unspecified: Secondary | ICD-10-CM | POA: Diagnosis not present

## 2018-03-27 DIAGNOSIS — S72001D Fracture of unspecified part of neck of right femur, subsequent encounter for closed fracture with routine healing: Secondary | ICD-10-CM | POA: Diagnosis not present

## 2018-03-27 DIAGNOSIS — D649 Anemia, unspecified: Secondary | ICD-10-CM | POA: Diagnosis not present

## 2018-03-27 DIAGNOSIS — R7989 Other specified abnormal findings of blood chemistry: Secondary | ICD-10-CM | POA: Diagnosis not present

## 2018-03-27 DIAGNOSIS — I2510 Atherosclerotic heart disease of native coronary artery without angina pectoris: Secondary | ICD-10-CM | POA: Diagnosis not present

## 2018-03-27 DIAGNOSIS — I10 Essential (primary) hypertension: Secondary | ICD-10-CM | POA: Diagnosis not present

## 2018-03-27 DIAGNOSIS — E870 Hyperosmolality and hypernatremia: Secondary | ICD-10-CM | POA: Diagnosis not present

## 2018-03-27 DIAGNOSIS — Z7401 Bed confinement status: Secondary | ICD-10-CM | POA: Diagnosis not present

## 2018-03-27 DIAGNOSIS — R262 Difficulty in walking, not elsewhere classified: Secondary | ICD-10-CM | POA: Diagnosis not present

## 2018-03-27 DIAGNOSIS — E119 Type 2 diabetes mellitus without complications: Secondary | ICD-10-CM | POA: Diagnosis not present

## 2018-03-27 DIAGNOSIS — S72001A Fracture of unspecified part of neck of right femur, initial encounter for closed fracture: Secondary | ICD-10-CM | POA: Diagnosis not present

## 2018-03-27 DIAGNOSIS — I4891 Unspecified atrial fibrillation: Secondary | ICD-10-CM | POA: Diagnosis not present

## 2018-03-27 DIAGNOSIS — G8918 Other acute postprocedural pain: Secondary | ICD-10-CM | POA: Diagnosis not present

## 2018-03-27 DIAGNOSIS — G20 Parkinson's disease: Secondary | ICD-10-CM | POA: Diagnosis not present

## 2018-03-27 DIAGNOSIS — K219 Gastro-esophageal reflux disease without esophagitis: Secondary | ICD-10-CM | POA: Diagnosis not present

## 2018-03-27 DIAGNOSIS — E7800 Pure hypercholesterolemia, unspecified: Secondary | ICD-10-CM | POA: Diagnosis not present

## 2018-03-27 DIAGNOSIS — E871 Hypo-osmolality and hyponatremia: Secondary | ICD-10-CM | POA: Diagnosis not present

## 2018-03-27 DIAGNOSIS — S72009D Fracture of unspecified part of neck of unspecified femur, subsequent encounter for closed fracture with routine healing: Secondary | ICD-10-CM | POA: Diagnosis not present

## 2018-03-27 DIAGNOSIS — N183 Chronic kidney disease, stage 3 (moderate): Secondary | ICD-10-CM | POA: Diagnosis not present

## 2018-03-27 DIAGNOSIS — S72009A Fracture of unspecified part of neck of unspecified femur, initial encounter for closed fracture: Secondary | ICD-10-CM | POA: Diagnosis not present

## 2018-03-27 DIAGNOSIS — R251 Tremor, unspecified: Secondary | ICD-10-CM | POA: Diagnosis not present

## 2018-04-01 DIAGNOSIS — S72009D Fracture of unspecified part of neck of unspecified femur, subsequent encounter for closed fracture with routine healing: Secondary | ICD-10-CM | POA: Diagnosis not present

## 2018-04-01 DIAGNOSIS — D649 Anemia, unspecified: Secondary | ICD-10-CM | POA: Diagnosis not present

## 2018-04-01 DIAGNOSIS — G8918 Other acute postprocedural pain: Secondary | ICD-10-CM | POA: Diagnosis not present

## 2018-04-01 DIAGNOSIS — R262 Difficulty in walking, not elsewhere classified: Secondary | ICD-10-CM | POA: Diagnosis not present

## 2018-04-16 DIAGNOSIS — E041 Nontoxic single thyroid nodule: Secondary | ICD-10-CM | POA: Diagnosis not present

## 2018-04-16 DIAGNOSIS — S91011D Laceration without foreign body, right ankle, subsequent encounter: Secondary | ICD-10-CM | POA: Diagnosis not present

## 2018-04-16 DIAGNOSIS — I480 Paroxysmal atrial fibrillation: Secondary | ICD-10-CM | POA: Diagnosis not present

## 2018-04-16 DIAGNOSIS — I10 Essential (primary) hypertension: Secondary | ICD-10-CM | POA: Diagnosis not present

## 2018-04-16 DIAGNOSIS — E119 Type 2 diabetes mellitus without complications: Secondary | ICD-10-CM | POA: Diagnosis not present

## 2018-04-16 DIAGNOSIS — J449 Chronic obstructive pulmonary disease, unspecified: Secondary | ICD-10-CM | POA: Diagnosis not present

## 2018-04-16 DIAGNOSIS — S72001D Fracture of unspecified part of neck of right femur, subsequent encounter for closed fracture with routine healing: Secondary | ICD-10-CM | POA: Diagnosis not present

## 2018-04-16 DIAGNOSIS — N400 Enlarged prostate without lower urinary tract symptoms: Secondary | ICD-10-CM | POA: Diagnosis not present

## 2018-04-16 DIAGNOSIS — D649 Anemia, unspecified: Secondary | ICD-10-CM | POA: Diagnosis not present

## 2018-04-17 ENCOUNTER — Other Ambulatory Visit: Payer: Self-pay

## 2018-04-17 DIAGNOSIS — N400 Enlarged prostate without lower urinary tract symptoms: Secondary | ICD-10-CM | POA: Diagnosis not present

## 2018-04-17 DIAGNOSIS — I10 Essential (primary) hypertension: Secondary | ICD-10-CM | POA: Diagnosis not present

## 2018-04-17 DIAGNOSIS — S72001D Fracture of unspecified part of neck of right femur, subsequent encounter for closed fracture with routine healing: Secondary | ICD-10-CM | POA: Diagnosis not present

## 2018-04-17 DIAGNOSIS — I480 Paroxysmal atrial fibrillation: Secondary | ICD-10-CM | POA: Diagnosis not present

## 2018-04-17 DIAGNOSIS — S91011D Laceration without foreign body, right ankle, subsequent encounter: Secondary | ICD-10-CM | POA: Diagnosis not present

## 2018-04-17 DIAGNOSIS — D649 Anemia, unspecified: Secondary | ICD-10-CM | POA: Diagnosis not present

## 2018-04-17 DIAGNOSIS — E119 Type 2 diabetes mellitus without complications: Secondary | ICD-10-CM | POA: Diagnosis not present

## 2018-04-17 DIAGNOSIS — J449 Chronic obstructive pulmonary disease, unspecified: Secondary | ICD-10-CM | POA: Diagnosis not present

## 2018-04-17 DIAGNOSIS — E041 Nontoxic single thyroid nodule: Secondary | ICD-10-CM | POA: Diagnosis not present

## 2018-04-17 NOTE — Patient Outreach (Signed)
[name]) Care Management  04/17/2018  Glen Lee 28-May-1934 [id number]  EMMI: general discharged red alert Referral date: 04/17/18 Referral reason: scheduled follow up appointment Insurance: [employer] Day # 1  Telephone call to patient regarding EMMI general discharged red alert. HIPAA verified. Explained reason for call.  Patient gave verbal authorization to speak with his son, Glen Lee regarding his health information. Patient states he was in the hospital due to a fractured right hip from a fall. Patient states he was in the nursing home for rehab. Patient states  He is scheduled to see his primary care provider on 04/29/18.  Patient states he was discharged from [facility] home on 04/15/18.  He states he does not have a scheduled follow up with the surgeon as of yet. Patient states he is to see his surgeon within 6 weeks.  Patient states his incision is healing well. Patient reports his staples have been removed. Patient states he is having issues with transportation to his doctors appointments.  Patient states he has his medications and takes them as prescribed. Patient states he is receiving home health services.  RNCM discussed and offered [facility] care management services. Patient verbally agreed to follow up with social worker regarding transportation assistance.  RNCM advised patient to keep follow up appointment with primary MD. Advised patient to call the surgeon's office to schedule follow up.  RNCM discussed signs/ symptoms of infection. Advised patient to call doctor for symptoms. Patient verbalized understanding.   PLAN: RNCM will refer patient to Education officer, museum.   Glen Plowman RN,BSN,CCM [facility] Telephonic  [phone]    PLAN:

## 2018-04-18 DIAGNOSIS — E041 Nontoxic single thyroid nodule: Secondary | ICD-10-CM | POA: Diagnosis not present

## 2018-04-18 DIAGNOSIS — I480 Paroxysmal atrial fibrillation: Secondary | ICD-10-CM | POA: Diagnosis not present

## 2018-04-18 DIAGNOSIS — I10 Essential (primary) hypertension: Secondary | ICD-10-CM | POA: Diagnosis not present

## 2018-04-18 DIAGNOSIS — S72001D Fracture of unspecified part of neck of right femur, subsequent encounter for closed fracture with routine healing: Secondary | ICD-10-CM | POA: Diagnosis not present

## 2018-04-18 DIAGNOSIS — S91011D Laceration without foreign body, right ankle, subsequent encounter: Secondary | ICD-10-CM | POA: Diagnosis not present

## 2018-04-18 DIAGNOSIS — E119 Type 2 diabetes mellitus without complications: Secondary | ICD-10-CM | POA: Diagnosis not present

## 2018-04-18 DIAGNOSIS — D649 Anemia, unspecified: Secondary | ICD-10-CM | POA: Diagnosis not present

## 2018-04-18 DIAGNOSIS — N400 Enlarged prostate without lower urinary tract symptoms: Secondary | ICD-10-CM | POA: Diagnosis not present

## 2018-04-18 DIAGNOSIS — J449 Chronic obstructive pulmonary disease, unspecified: Secondary | ICD-10-CM | POA: Diagnosis not present

## 2018-04-19 DIAGNOSIS — I480 Paroxysmal atrial fibrillation: Secondary | ICD-10-CM | POA: Diagnosis not present

## 2018-04-19 DIAGNOSIS — I10 Essential (primary) hypertension: Secondary | ICD-10-CM | POA: Diagnosis not present

## 2018-04-19 DIAGNOSIS — E119 Type 2 diabetes mellitus without complications: Secondary | ICD-10-CM | POA: Diagnosis not present

## 2018-04-19 DIAGNOSIS — J449 Chronic obstructive pulmonary disease, unspecified: Secondary | ICD-10-CM | POA: Diagnosis not present

## 2018-04-19 DIAGNOSIS — D649 Anemia, unspecified: Secondary | ICD-10-CM | POA: Diagnosis not present

## 2018-04-19 DIAGNOSIS — E041 Nontoxic single thyroid nodule: Secondary | ICD-10-CM | POA: Diagnosis not present

## 2018-04-19 DIAGNOSIS — S72001D Fracture of unspecified part of neck of right femur, subsequent encounter for closed fracture with routine healing: Secondary | ICD-10-CM | POA: Diagnosis not present

## 2018-04-19 DIAGNOSIS — N400 Enlarged prostate without lower urinary tract symptoms: Secondary | ICD-10-CM | POA: Diagnosis not present

## 2018-04-19 DIAGNOSIS — S91011D Laceration without foreign body, right ankle, subsequent encounter: Secondary | ICD-10-CM | POA: Diagnosis not present

## 2018-04-22 ENCOUNTER — Other Ambulatory Visit: Payer: Self-pay

## 2018-04-22 NOTE — Patient Outreach (Signed)
[facility]) Care Management  04/22/2018  Clevon Khader 1933/12/23 [id number]  Successful outreach to the patient on today's date, HIPAA identifiers confirmed. The patient requests BSW speak to his son, Quita Skye. Quita Skye currently in the home with the patient and reports he is currently down from [place] on "PFL" (paid family leave) to assist in caring for the patient. The patient recently returned home from [place] where he rehabbed after undergoing hip surgery. The patient has extensive history of injuries including shattered right ankle, left shoulder fracture, left knee replacement, and his most recent injury of a fractured right hip leading to a "femur neck replacement".   The patient is wheelchair bound and requires a wheelchair accessible vehicle for transport. The patient is receiving home health services at this time. The patients son has been in the home for 1 week and plans to remain for another 9 unless the patient no longer needs his assistance. The patient has [facility] PPO which does not have transportation benefits. The patient resides in [place] and confirms all physicians are also in this county.   BSW educated the patients son on [employer] transportation services provided by the county. BSW provided the contact number to this program. Quita Skye to call and enroll the patient for services. BSW discussed meals on wheels but was informed the patient "does not like the food". BSW to mail information on "meals for mom" program to the patient in case it is needed in the future.  BSW also briefly discussed home care program funded by [employer] ([facility]) with the patients son. BSW to mail a brochure to the patients home to provide more information. BSW explained this may be a good option once home health services are discontinued.  Plan: BSW to contact the patient in the next two weeks to confirm receipt of mailed resources. Quita Skye (patients son) has this  BSW's direct contact number and is aware he may call if services are needed prior to the next scheduled call.  Daneen Schick, BSW, CDP [facility] [phone]

## 2018-04-23 DIAGNOSIS — D649 Anemia, unspecified: Secondary | ICD-10-CM | POA: Diagnosis not present

## 2018-04-23 DIAGNOSIS — E119 Type 2 diabetes mellitus without complications: Secondary | ICD-10-CM | POA: Diagnosis not present

## 2018-04-23 DIAGNOSIS — E041 Nontoxic single thyroid nodule: Secondary | ICD-10-CM | POA: Diagnosis not present

## 2018-04-23 DIAGNOSIS — I480 Paroxysmal atrial fibrillation: Secondary | ICD-10-CM | POA: Diagnosis not present

## 2018-04-23 DIAGNOSIS — S72001D Fracture of unspecified part of neck of right femur, subsequent encounter for closed fracture with routine healing: Secondary | ICD-10-CM | POA: Diagnosis not present

## 2018-04-23 DIAGNOSIS — S91011D Laceration without foreign body, right ankle, subsequent encounter: Secondary | ICD-10-CM | POA: Diagnosis not present

## 2018-04-23 DIAGNOSIS — N400 Enlarged prostate without lower urinary tract symptoms: Secondary | ICD-10-CM | POA: Diagnosis not present

## 2018-04-23 DIAGNOSIS — J449 Chronic obstructive pulmonary disease, unspecified: Secondary | ICD-10-CM | POA: Diagnosis not present

## 2018-04-23 DIAGNOSIS — I10 Essential (primary) hypertension: Secondary | ICD-10-CM | POA: Diagnosis not present

## 2018-04-25 DIAGNOSIS — D649 Anemia, unspecified: Secondary | ICD-10-CM | POA: Diagnosis not present

## 2018-04-25 DIAGNOSIS — I480 Paroxysmal atrial fibrillation: Secondary | ICD-10-CM | POA: Diagnosis not present

## 2018-04-25 DIAGNOSIS — E119 Type 2 diabetes mellitus without complications: Secondary | ICD-10-CM | POA: Diagnosis not present

## 2018-04-25 DIAGNOSIS — S72001D Fracture of unspecified part of neck of right femur, subsequent encounter for closed fracture with routine healing: Secondary | ICD-10-CM | POA: Diagnosis not present

## 2018-04-25 DIAGNOSIS — J449 Chronic obstructive pulmonary disease, unspecified: Secondary | ICD-10-CM | POA: Diagnosis not present

## 2018-04-25 DIAGNOSIS — E041 Nontoxic single thyroid nodule: Secondary | ICD-10-CM | POA: Diagnosis not present

## 2018-04-25 DIAGNOSIS — N400 Enlarged prostate without lower urinary tract symptoms: Secondary | ICD-10-CM | POA: Diagnosis not present

## 2018-04-25 DIAGNOSIS — I10 Essential (primary) hypertension: Secondary | ICD-10-CM | POA: Diagnosis not present

## 2018-04-25 DIAGNOSIS — S91011D Laceration without foreign body, right ankle, subsequent encounter: Secondary | ICD-10-CM | POA: Diagnosis not present

## 2018-04-30 DIAGNOSIS — I480 Paroxysmal atrial fibrillation: Secondary | ICD-10-CM | POA: Diagnosis not present

## 2018-04-30 DIAGNOSIS — I10 Essential (primary) hypertension: Secondary | ICD-10-CM | POA: Diagnosis not present

## 2018-04-30 DIAGNOSIS — S72001D Fracture of unspecified part of neck of right femur, subsequent encounter for closed fracture with routine healing: Secondary | ICD-10-CM | POA: Diagnosis not present

## 2018-04-30 DIAGNOSIS — J449 Chronic obstructive pulmonary disease, unspecified: Secondary | ICD-10-CM | POA: Diagnosis not present

## 2018-04-30 DIAGNOSIS — N400 Enlarged prostate without lower urinary tract symptoms: Secondary | ICD-10-CM | POA: Diagnosis not present

## 2018-04-30 DIAGNOSIS — E119 Type 2 diabetes mellitus without complications: Secondary | ICD-10-CM | POA: Diagnosis not present

## 2018-04-30 DIAGNOSIS — D649 Anemia, unspecified: Secondary | ICD-10-CM | POA: Diagnosis not present

## 2018-04-30 DIAGNOSIS — S91011D Laceration without foreign body, right ankle, subsequent encounter: Secondary | ICD-10-CM | POA: Diagnosis not present

## 2018-04-30 DIAGNOSIS — E041 Nontoxic single thyroid nodule: Secondary | ICD-10-CM | POA: Diagnosis not present

## 2018-05-02 DIAGNOSIS — D649 Anemia, unspecified: Secondary | ICD-10-CM | POA: Diagnosis not present

## 2018-05-02 DIAGNOSIS — N400 Enlarged prostate without lower urinary tract symptoms: Secondary | ICD-10-CM | POA: Diagnosis not present

## 2018-05-02 DIAGNOSIS — S72001D Fracture of unspecified part of neck of right femur, subsequent encounter for closed fracture with routine healing: Secondary | ICD-10-CM | POA: Diagnosis not present

## 2018-05-02 DIAGNOSIS — J449 Chronic obstructive pulmonary disease, unspecified: Secondary | ICD-10-CM | POA: Diagnosis not present

## 2018-05-02 DIAGNOSIS — E119 Type 2 diabetes mellitus without complications: Secondary | ICD-10-CM | POA: Diagnosis not present

## 2018-05-02 DIAGNOSIS — S91011D Laceration without foreign body, right ankle, subsequent encounter: Secondary | ICD-10-CM | POA: Diagnosis not present

## 2018-05-02 DIAGNOSIS — I480 Paroxysmal atrial fibrillation: Secondary | ICD-10-CM | POA: Diagnosis not present

## 2018-05-02 DIAGNOSIS — I10 Essential (primary) hypertension: Secondary | ICD-10-CM | POA: Diagnosis not present

## 2018-05-02 DIAGNOSIS — E041 Nontoxic single thyroid nodule: Secondary | ICD-10-CM | POA: Diagnosis not present

## 2018-05-03 DIAGNOSIS — J449 Chronic obstructive pulmonary disease, unspecified: Secondary | ICD-10-CM | POA: Diagnosis not present

## 2018-05-03 DIAGNOSIS — N400 Enlarged prostate without lower urinary tract symptoms: Secondary | ICD-10-CM | POA: Diagnosis not present

## 2018-05-03 DIAGNOSIS — I480 Paroxysmal atrial fibrillation: Secondary | ICD-10-CM | POA: Diagnosis not present

## 2018-05-03 DIAGNOSIS — S91011D Laceration without foreign body, right ankle, subsequent encounter: Secondary | ICD-10-CM | POA: Diagnosis not present

## 2018-05-03 DIAGNOSIS — S72001D Fracture of unspecified part of neck of right femur, subsequent encounter for closed fracture with routine healing: Secondary | ICD-10-CM | POA: Diagnosis not present

## 2018-05-03 DIAGNOSIS — E041 Nontoxic single thyroid nodule: Secondary | ICD-10-CM | POA: Diagnosis not present

## 2018-05-03 DIAGNOSIS — I10 Essential (primary) hypertension: Secondary | ICD-10-CM | POA: Diagnosis not present

## 2018-05-03 DIAGNOSIS — E119 Type 2 diabetes mellitus without complications: Secondary | ICD-10-CM | POA: Diagnosis not present

## 2018-05-03 DIAGNOSIS — D649 Anemia, unspecified: Secondary | ICD-10-CM | POA: Diagnosis not present

## 2018-05-06 DIAGNOSIS — S91011D Laceration without foreign body, right ankle, subsequent encounter: Secondary | ICD-10-CM | POA: Diagnosis not present

## 2018-05-06 DIAGNOSIS — E041 Nontoxic single thyroid nodule: Secondary | ICD-10-CM | POA: Diagnosis not present

## 2018-05-06 DIAGNOSIS — J449 Chronic obstructive pulmonary disease, unspecified: Secondary | ICD-10-CM | POA: Diagnosis not present

## 2018-05-06 DIAGNOSIS — D649 Anemia, unspecified: Secondary | ICD-10-CM | POA: Diagnosis not present

## 2018-05-06 DIAGNOSIS — S72001D Fracture of unspecified part of neck of right femur, subsequent encounter for closed fracture with routine healing: Secondary | ICD-10-CM | POA: Diagnosis not present

## 2018-05-06 DIAGNOSIS — I10 Essential (primary) hypertension: Secondary | ICD-10-CM | POA: Diagnosis not present

## 2018-05-06 DIAGNOSIS — E119 Type 2 diabetes mellitus without complications: Secondary | ICD-10-CM | POA: Diagnosis not present

## 2018-05-06 DIAGNOSIS — N400 Enlarged prostate without lower urinary tract symptoms: Secondary | ICD-10-CM | POA: Diagnosis not present

## 2018-05-06 DIAGNOSIS — I480 Paroxysmal atrial fibrillation: Secondary | ICD-10-CM | POA: Diagnosis not present

## 2018-05-07 ENCOUNTER — Other Ambulatory Visit: Payer: Self-pay

## 2018-05-07 DIAGNOSIS — I10 Essential (primary) hypertension: Secondary | ICD-10-CM | POA: Diagnosis not present

## 2018-05-07 DIAGNOSIS — N400 Enlarged prostate without lower urinary tract symptoms: Secondary | ICD-10-CM | POA: Diagnosis not present

## 2018-05-07 DIAGNOSIS — S72001D Fracture of unspecified part of neck of right femur, subsequent encounter for closed fracture with routine healing: Secondary | ICD-10-CM | POA: Diagnosis not present

## 2018-05-07 DIAGNOSIS — J449 Chronic obstructive pulmonary disease, unspecified: Secondary | ICD-10-CM | POA: Diagnosis not present

## 2018-05-07 DIAGNOSIS — D649 Anemia, unspecified: Secondary | ICD-10-CM | POA: Diagnosis not present

## 2018-05-07 DIAGNOSIS — S91011D Laceration without foreign body, right ankle, subsequent encounter: Secondary | ICD-10-CM | POA: Diagnosis not present

## 2018-05-07 DIAGNOSIS — E041 Nontoxic single thyroid nodule: Secondary | ICD-10-CM | POA: Diagnosis not present

## 2018-05-07 DIAGNOSIS — I480 Paroxysmal atrial fibrillation: Secondary | ICD-10-CM | POA: Diagnosis not present

## 2018-05-07 DIAGNOSIS — E119 Type 2 diabetes mellitus without complications: Secondary | ICD-10-CM | POA: Diagnosis not present

## 2018-05-07 NOTE — Patient Outreach (Signed)
[facility]) Care Management  05/07/2018  Glen Lee 1933/06/27 [id number]  Successful outreach to the patient on today's date, HIPAA identifiers confirmed. The patient confirms receipt of mailed resources. The patient further reports he discovered his "retirement covers free transportation". The patient reports he will receive unlimited free transportation to physician appointments as part of a retirement benefit. The patient acknowledges he may use [employer] if needed for in county transportation for a small fee. The patient denies any other social work needs at this time.  BSW to perform a case closure as no other social work needs are identified. The patient is in agreement with this plan. The patient is aware he may make a self referral to [facility] if future needs arise.  Daneen Schick, BSW, CDP [facility] [phone]

## 2018-05-08 DIAGNOSIS — J449 Chronic obstructive pulmonary disease, unspecified: Secondary | ICD-10-CM | POA: Diagnosis not present

## 2018-05-08 DIAGNOSIS — E041 Nontoxic single thyroid nodule: Secondary | ICD-10-CM | POA: Diagnosis not present

## 2018-05-08 DIAGNOSIS — D649 Anemia, unspecified: Secondary | ICD-10-CM | POA: Diagnosis not present

## 2018-05-08 DIAGNOSIS — N400 Enlarged prostate without lower urinary tract symptoms: Secondary | ICD-10-CM | POA: Diagnosis not present

## 2018-05-08 DIAGNOSIS — I10 Essential (primary) hypertension: Secondary | ICD-10-CM | POA: Diagnosis not present

## 2018-05-08 DIAGNOSIS — S72001D Fracture of unspecified part of neck of right femur, subsequent encounter for closed fracture with routine healing: Secondary | ICD-10-CM | POA: Diagnosis not present

## 2018-05-08 DIAGNOSIS — S91011D Laceration without foreign body, right ankle, subsequent encounter: Secondary | ICD-10-CM | POA: Diagnosis not present

## 2018-05-08 DIAGNOSIS — I480 Paroxysmal atrial fibrillation: Secondary | ICD-10-CM | POA: Diagnosis not present

## 2018-05-08 DIAGNOSIS — E119 Type 2 diabetes mellitus without complications: Secondary | ICD-10-CM | POA: Diagnosis not present

## 2018-05-10 DIAGNOSIS — E119 Type 2 diabetes mellitus without complications: Secondary | ICD-10-CM | POA: Diagnosis not present

## 2018-05-10 DIAGNOSIS — I480 Paroxysmal atrial fibrillation: Secondary | ICD-10-CM | POA: Diagnosis not present

## 2018-05-10 DIAGNOSIS — N400 Enlarged prostate without lower urinary tract symptoms: Secondary | ICD-10-CM | POA: Diagnosis not present

## 2018-05-10 DIAGNOSIS — J449 Chronic obstructive pulmonary disease, unspecified: Secondary | ICD-10-CM | POA: Diagnosis not present

## 2018-05-10 DIAGNOSIS — S91011D Laceration without foreign body, right ankle, subsequent encounter: Secondary | ICD-10-CM | POA: Diagnosis not present

## 2018-05-10 DIAGNOSIS — S72001D Fracture of unspecified part of neck of right femur, subsequent encounter for closed fracture with routine healing: Secondary | ICD-10-CM | POA: Diagnosis not present

## 2018-05-10 DIAGNOSIS — I10 Essential (primary) hypertension: Secondary | ICD-10-CM | POA: Diagnosis not present

## 2018-05-10 DIAGNOSIS — E041 Nontoxic single thyroid nodule: Secondary | ICD-10-CM | POA: Diagnosis not present

## 2018-05-10 DIAGNOSIS — D649 Anemia, unspecified: Secondary | ICD-10-CM | POA: Diagnosis not present

## 2018-05-13 DIAGNOSIS — T8483XA Hemorrhage due to internal orthopedic prosthetic devices, implants and grafts, initial encounter: Secondary | ICD-10-CM | POA: Diagnosis not present

## 2018-05-13 DIAGNOSIS — S72001A Fracture of unspecified part of neck of right femur, initial encounter for closed fracture: Secondary | ICD-10-CM | POA: Diagnosis not present

## 2018-05-13 DIAGNOSIS — Z008 Encounter for other general examination: Secondary | ICD-10-CM | POA: Diagnosis not present

## 2018-05-15 DIAGNOSIS — E119 Type 2 diabetes mellitus without complications: Secondary | ICD-10-CM | POA: Diagnosis not present

## 2018-05-15 DIAGNOSIS — D649 Anemia, unspecified: Secondary | ICD-10-CM | POA: Diagnosis not present

## 2018-05-15 DIAGNOSIS — E041 Nontoxic single thyroid nodule: Secondary | ICD-10-CM | POA: Diagnosis not present

## 2018-05-15 DIAGNOSIS — J449 Chronic obstructive pulmonary disease, unspecified: Secondary | ICD-10-CM | POA: Diagnosis not present

## 2018-05-15 DIAGNOSIS — S72001D Fracture of unspecified part of neck of right femur, subsequent encounter for closed fracture with routine healing: Secondary | ICD-10-CM | POA: Diagnosis not present

## 2018-05-15 DIAGNOSIS — N400 Enlarged prostate without lower urinary tract symptoms: Secondary | ICD-10-CM | POA: Diagnosis not present

## 2018-05-15 DIAGNOSIS — S91011D Laceration without foreign body, right ankle, subsequent encounter: Secondary | ICD-10-CM | POA: Diagnosis not present

## 2018-05-15 DIAGNOSIS — I10 Essential (primary) hypertension: Secondary | ICD-10-CM | POA: Diagnosis not present

## 2018-05-15 DIAGNOSIS — I480 Paroxysmal atrial fibrillation: Secondary | ICD-10-CM | POA: Diagnosis not present

## 2018-05-20 DIAGNOSIS — I10 Essential (primary) hypertension: Secondary | ICD-10-CM | POA: Diagnosis not present

## 2018-05-20 DIAGNOSIS — E119 Type 2 diabetes mellitus without complications: Secondary | ICD-10-CM | POA: Diagnosis not present

## 2018-05-20 DIAGNOSIS — J449 Chronic obstructive pulmonary disease, unspecified: Secondary | ICD-10-CM | POA: Diagnosis not present

## 2018-05-20 DIAGNOSIS — N400 Enlarged prostate without lower urinary tract symptoms: Secondary | ICD-10-CM | POA: Diagnosis not present

## 2018-05-20 DIAGNOSIS — S72001D Fracture of unspecified part of neck of right femur, subsequent encounter for closed fracture with routine healing: Secondary | ICD-10-CM | POA: Diagnosis not present

## 2018-05-20 DIAGNOSIS — D649 Anemia, unspecified: Secondary | ICD-10-CM | POA: Diagnosis not present

## 2018-05-20 DIAGNOSIS — S91011D Laceration without foreign body, right ankle, subsequent encounter: Secondary | ICD-10-CM | POA: Diagnosis not present

## 2018-05-20 DIAGNOSIS — E041 Nontoxic single thyroid nodule: Secondary | ICD-10-CM | POA: Diagnosis not present

## 2018-05-20 DIAGNOSIS — I480 Paroxysmal atrial fibrillation: Secondary | ICD-10-CM | POA: Diagnosis not present

## 2018-05-23 DIAGNOSIS — D649 Anemia, unspecified: Secondary | ICD-10-CM | POA: Diagnosis not present

## 2018-05-23 DIAGNOSIS — E041 Nontoxic single thyroid nodule: Secondary | ICD-10-CM | POA: Diagnosis not present

## 2018-05-23 DIAGNOSIS — N400 Enlarged prostate without lower urinary tract symptoms: Secondary | ICD-10-CM | POA: Diagnosis not present

## 2018-05-23 DIAGNOSIS — S72001D Fracture of unspecified part of neck of right femur, subsequent encounter for closed fracture with routine healing: Secondary | ICD-10-CM | POA: Diagnosis not present

## 2018-05-23 DIAGNOSIS — I10 Essential (primary) hypertension: Secondary | ICD-10-CM | POA: Diagnosis not present

## 2018-05-23 DIAGNOSIS — I480 Paroxysmal atrial fibrillation: Secondary | ICD-10-CM | POA: Diagnosis not present

## 2018-05-23 DIAGNOSIS — E119 Type 2 diabetes mellitus without complications: Secondary | ICD-10-CM | POA: Diagnosis not present

## 2018-05-23 DIAGNOSIS — S91011D Laceration without foreign body, right ankle, subsequent encounter: Secondary | ICD-10-CM | POA: Diagnosis not present

## 2018-05-23 DIAGNOSIS — J449 Chronic obstructive pulmonary disease, unspecified: Secondary | ICD-10-CM | POA: Diagnosis not present

## 2018-05-29 DIAGNOSIS — S72001D Fracture of unspecified part of neck of right femur, subsequent encounter for closed fracture with routine healing: Secondary | ICD-10-CM | POA: Diagnosis not present

## 2018-05-29 DIAGNOSIS — D649 Anemia, unspecified: Secondary | ICD-10-CM | POA: Diagnosis not present

## 2018-05-29 DIAGNOSIS — J449 Chronic obstructive pulmonary disease, unspecified: Secondary | ICD-10-CM | POA: Diagnosis not present

## 2018-05-29 DIAGNOSIS — M7662 Achilles tendinitis, left leg: Secondary | ICD-10-CM | POA: Diagnosis not present

## 2018-05-29 DIAGNOSIS — R600 Localized edema: Secondary | ICD-10-CM | POA: Diagnosis not present

## 2018-05-29 DIAGNOSIS — M79662 Pain in left lower leg: Secondary | ICD-10-CM | POA: Diagnosis not present

## 2018-05-29 DIAGNOSIS — I480 Paroxysmal atrial fibrillation: Secondary | ICD-10-CM | POA: Diagnosis not present

## 2018-05-29 DIAGNOSIS — E041 Nontoxic single thyroid nodule: Secondary | ICD-10-CM | POA: Diagnosis not present

## 2018-05-29 DIAGNOSIS — N400 Enlarged prostate without lower urinary tract symptoms: Secondary | ICD-10-CM | POA: Diagnosis not present

## 2018-05-29 DIAGNOSIS — I10 Essential (primary) hypertension: Secondary | ICD-10-CM | POA: Diagnosis not present

## 2018-05-29 DIAGNOSIS — E119 Type 2 diabetes mellitus without complications: Secondary | ICD-10-CM | POA: Diagnosis not present

## 2018-05-29 DIAGNOSIS — S91011D Laceration without foreign body, right ankle, subsequent encounter: Secondary | ICD-10-CM | POA: Diagnosis not present

## 2018-05-30 DIAGNOSIS — S72001D Fracture of unspecified part of neck of right femur, subsequent encounter for closed fracture with routine healing: Secondary | ICD-10-CM | POA: Diagnosis not present

## 2018-05-30 DIAGNOSIS — E119 Type 2 diabetes mellitus without complications: Secondary | ICD-10-CM | POA: Diagnosis not present

## 2018-05-30 DIAGNOSIS — S91011D Laceration without foreign body, right ankle, subsequent encounter: Secondary | ICD-10-CM | POA: Diagnosis not present

## 2018-05-30 DIAGNOSIS — E041 Nontoxic single thyroid nodule: Secondary | ICD-10-CM | POA: Diagnosis not present

## 2018-05-30 DIAGNOSIS — D649 Anemia, unspecified: Secondary | ICD-10-CM | POA: Diagnosis not present

## 2018-05-30 DIAGNOSIS — I480 Paroxysmal atrial fibrillation: Secondary | ICD-10-CM | POA: Diagnosis not present

## 2018-05-30 DIAGNOSIS — I10 Essential (primary) hypertension: Secondary | ICD-10-CM | POA: Diagnosis not present

## 2018-05-30 DIAGNOSIS — N400 Enlarged prostate without lower urinary tract symptoms: Secondary | ICD-10-CM | POA: Diagnosis not present

## 2018-05-30 DIAGNOSIS — J449 Chronic obstructive pulmonary disease, unspecified: Secondary | ICD-10-CM | POA: Diagnosis not present

## 2018-06-02 DIAGNOSIS — S72001D Fracture of unspecified part of neck of right femur, subsequent encounter for closed fracture with routine healing: Secondary | ICD-10-CM | POA: Diagnosis not present

## 2018-06-02 DIAGNOSIS — J449 Chronic obstructive pulmonary disease, unspecified: Secondary | ICD-10-CM | POA: Diagnosis not present

## 2018-06-02 DIAGNOSIS — I480 Paroxysmal atrial fibrillation: Secondary | ICD-10-CM | POA: Diagnosis not present

## 2018-06-02 DIAGNOSIS — D649 Anemia, unspecified: Secondary | ICD-10-CM | POA: Diagnosis not present

## 2018-06-02 DIAGNOSIS — E041 Nontoxic single thyroid nodule: Secondary | ICD-10-CM | POA: Diagnosis not present

## 2018-06-02 DIAGNOSIS — E119 Type 2 diabetes mellitus without complications: Secondary | ICD-10-CM | POA: Diagnosis not present

## 2018-06-02 DIAGNOSIS — I10 Essential (primary) hypertension: Secondary | ICD-10-CM | POA: Diagnosis not present

## 2018-06-02 DIAGNOSIS — N400 Enlarged prostate without lower urinary tract symptoms: Secondary | ICD-10-CM | POA: Diagnosis not present

## 2018-06-02 DIAGNOSIS — S91011D Laceration without foreign body, right ankle, subsequent encounter: Secondary | ICD-10-CM | POA: Diagnosis not present

## 2018-06-04 DIAGNOSIS — S72001D Fracture of unspecified part of neck of right femur, subsequent encounter for closed fracture with routine healing: Secondary | ICD-10-CM | POA: Diagnosis not present

## 2018-06-04 DIAGNOSIS — S91011D Laceration without foreign body, right ankle, subsequent encounter: Secondary | ICD-10-CM | POA: Diagnosis not present

## 2018-06-04 DIAGNOSIS — D649 Anemia, unspecified: Secondary | ICD-10-CM | POA: Diagnosis not present

## 2018-06-04 DIAGNOSIS — N400 Enlarged prostate without lower urinary tract symptoms: Secondary | ICD-10-CM | POA: Diagnosis not present

## 2018-06-04 DIAGNOSIS — I10 Essential (primary) hypertension: Secondary | ICD-10-CM | POA: Diagnosis not present

## 2018-06-04 DIAGNOSIS — I480 Paroxysmal atrial fibrillation: Secondary | ICD-10-CM | POA: Diagnosis not present

## 2018-06-04 DIAGNOSIS — E119 Type 2 diabetes mellitus without complications: Secondary | ICD-10-CM | POA: Diagnosis not present

## 2018-06-04 DIAGNOSIS — E041 Nontoxic single thyroid nodule: Secondary | ICD-10-CM | POA: Diagnosis not present

## 2018-06-04 DIAGNOSIS — J449 Chronic obstructive pulmonary disease, unspecified: Secondary | ICD-10-CM | POA: Diagnosis not present

## 2018-06-06 DIAGNOSIS — S72001A Fracture of unspecified part of neck of right femur, initial encounter for closed fracture: Secondary | ICD-10-CM | POA: Diagnosis not present

## 2018-06-10 DIAGNOSIS — Z7983 Long term (current) use of bisphosphonates: Secondary | ICD-10-CM | POA: Diagnosis not present

## 2018-06-10 DIAGNOSIS — M85832 Other specified disorders of bone density and structure, left forearm: Secondary | ICD-10-CM | POA: Diagnosis not present

## 2018-06-10 DIAGNOSIS — M8589 Other specified disorders of bone density and structure, multiple sites: Secondary | ICD-10-CM | POA: Diagnosis not present

## 2018-06-11 DIAGNOSIS — I480 Paroxysmal atrial fibrillation: Secondary | ICD-10-CM | POA: Diagnosis not present

## 2018-06-11 DIAGNOSIS — D649 Anemia, unspecified: Secondary | ICD-10-CM | POA: Diagnosis not present

## 2018-06-11 DIAGNOSIS — S91011D Laceration without foreign body, right ankle, subsequent encounter: Secondary | ICD-10-CM | POA: Diagnosis not present

## 2018-06-11 DIAGNOSIS — E119 Type 2 diabetes mellitus without complications: Secondary | ICD-10-CM | POA: Diagnosis not present

## 2018-06-11 DIAGNOSIS — E041 Nontoxic single thyroid nodule: Secondary | ICD-10-CM | POA: Diagnosis not present

## 2018-06-11 DIAGNOSIS — N400 Enlarged prostate without lower urinary tract symptoms: Secondary | ICD-10-CM | POA: Diagnosis not present

## 2018-06-11 DIAGNOSIS — J449 Chronic obstructive pulmonary disease, unspecified: Secondary | ICD-10-CM | POA: Diagnosis not present

## 2018-06-11 DIAGNOSIS — I10 Essential (primary) hypertension: Secondary | ICD-10-CM | POA: Diagnosis not present

## 2018-06-11 DIAGNOSIS — S72001D Fracture of unspecified part of neck of right femur, subsequent encounter for closed fracture with routine healing: Secondary | ICD-10-CM | POA: Diagnosis not present

## 2018-06-26 DIAGNOSIS — H906 Mixed conductive and sensorineural hearing loss, bilateral: Secondary | ICD-10-CM | POA: Diagnosis not present

## 2018-06-26 DIAGNOSIS — E1169 Type 2 diabetes mellitus with other specified complication: Secondary | ICD-10-CM | POA: Diagnosis not present

## 2018-06-26 DIAGNOSIS — I10 Essential (primary) hypertension: Secondary | ICD-10-CM | POA: Diagnosis not present

## 2018-06-26 DIAGNOSIS — E782 Mixed hyperlipidemia: Secondary | ICD-10-CM | POA: Diagnosis not present

## 2018-06-26 DIAGNOSIS — N400 Enlarged prostate without lower urinary tract symptoms: Secondary | ICD-10-CM | POA: Diagnosis not present

## 2018-06-26 DIAGNOSIS — K5100 Ulcerative (chronic) pancolitis without complications: Secondary | ICD-10-CM | POA: Diagnosis not present

## 2018-06-26 DIAGNOSIS — K219 Gastro-esophageal reflux disease without esophagitis: Secondary | ICD-10-CM | POA: Diagnosis not present

## 2018-06-26 DIAGNOSIS — D649 Anemia, unspecified: Secondary | ICD-10-CM | POA: Diagnosis not present

## 2018-06-26 DIAGNOSIS — E559 Vitamin D deficiency, unspecified: Secondary | ICD-10-CM | POA: Diagnosis not present

## 2018-06-27 DIAGNOSIS — N401 Enlarged prostate with lower urinary tract symptoms: Secondary | ICD-10-CM | POA: Diagnosis not present

## 2018-06-27 DIAGNOSIS — K219 Gastro-esophageal reflux disease without esophagitis: Secondary | ICD-10-CM | POA: Diagnosis not present

## 2018-06-27 DIAGNOSIS — M19012 Primary osteoarthritis, left shoulder: Secondary | ICD-10-CM | POA: Diagnosis not present

## 2018-06-27 DIAGNOSIS — G252 Other specified forms of tremor: Secondary | ICD-10-CM | POA: Diagnosis not present

## 2018-06-27 DIAGNOSIS — K2971 Gastritis, unspecified, with bleeding: Secondary | ICD-10-CM | POA: Diagnosis not present

## 2018-06-27 DIAGNOSIS — R339 Retention of urine, unspecified: Secondary | ICD-10-CM | POA: Diagnosis not present

## 2018-06-27 DIAGNOSIS — E1129 Type 2 diabetes mellitus with other diabetic kidney complication: Secondary | ICD-10-CM | POA: Diagnosis not present

## 2018-06-27 DIAGNOSIS — G894 Chronic pain syndrome: Secondary | ICD-10-CM | POA: Diagnosis not present

## 2018-06-27 DIAGNOSIS — I4891 Unspecified atrial fibrillation: Secondary | ICD-10-CM | POA: Diagnosis not present

## 2018-07-03 DIAGNOSIS — Z4789 Encounter for other orthopedic aftercare: Secondary | ICD-10-CM | POA: Diagnosis not present

## 2018-07-03 DIAGNOSIS — R2689 Other abnormalities of gait and mobility: Secondary | ICD-10-CM | POA: Diagnosis not present

## 2018-07-03 DIAGNOSIS — M25651 Stiffness of right hip, not elsewhere classified: Secondary | ICD-10-CM | POA: Diagnosis not present

## 2018-07-05 DIAGNOSIS — Z515 Encounter for palliative care: Secondary | ICD-10-CM | POA: Diagnosis not present

## 2018-07-08 DIAGNOSIS — C44319 Basal cell carcinoma of skin of other parts of face: Secondary | ICD-10-CM | POA: Diagnosis not present

## 2018-07-10 DIAGNOSIS — R2689 Other abnormalities of gait and mobility: Secondary | ICD-10-CM | POA: Diagnosis not present

## 2018-07-10 DIAGNOSIS — Z4789 Encounter for other orthopedic aftercare: Secondary | ICD-10-CM | POA: Diagnosis not present

## 2018-07-10 DIAGNOSIS — M25651 Stiffness of right hip, not elsewhere classified: Secondary | ICD-10-CM | POA: Diagnosis not present

## 2018-07-17 DIAGNOSIS — C441121 Basal cell carcinoma of skin of right upper eyelid, including canthus: Secondary | ICD-10-CM | POA: Diagnosis not present

## 2018-07-24 DIAGNOSIS — M79661 Pain in right lower leg: Secondary | ICD-10-CM | POA: Diagnosis not present

## 2018-07-24 DIAGNOSIS — M79662 Pain in left lower leg: Secondary | ICD-10-CM | POA: Diagnosis not present

## 2018-07-24 DIAGNOSIS — R600 Localized edema: Secondary | ICD-10-CM | POA: Diagnosis not present

## 2018-07-24 DIAGNOSIS — M7989 Other specified soft tissue disorders: Secondary | ICD-10-CM | POA: Diagnosis not present

## 2018-07-24 DIAGNOSIS — I8310 Varicose veins of unspecified lower extremity with inflammation: Secondary | ICD-10-CM | POA: Diagnosis not present

## 2018-07-25 DIAGNOSIS — R600 Localized edema: Secondary | ICD-10-CM | POA: Diagnosis not present

## 2018-07-25 DIAGNOSIS — L300 Nummular dermatitis: Secondary | ICD-10-CM | POA: Diagnosis not present

## 2018-07-25 DIAGNOSIS — I8310 Varicose veins of unspecified lower extremity with inflammation: Secondary | ICD-10-CM | POA: Diagnosis not present

## 2018-07-25 DIAGNOSIS — L97911 Non-pressure chronic ulcer of unspecified part of right lower leg limited to breakdown of skin: Secondary | ICD-10-CM | POA: Diagnosis not present

## 2018-08-12 DIAGNOSIS — I482 Chronic atrial fibrillation, unspecified: Secondary | ICD-10-CM | POA: Diagnosis not present

## 2018-08-12 DIAGNOSIS — R600 Localized edema: Secondary | ICD-10-CM | POA: Diagnosis not present

## 2018-08-12 DIAGNOSIS — R5383 Other fatigue: Secondary | ICD-10-CM | POA: Diagnosis not present

## 2018-08-13 DIAGNOSIS — R7309 Other abnormal glucose: Secondary | ICD-10-CM | POA: Diagnosis not present

## 2018-08-16 DIAGNOSIS — I10 Essential (primary) hypertension: Secondary | ICD-10-CM | POA: Diagnosis not present

## 2018-08-16 DIAGNOSIS — M1990 Unspecified osteoarthritis, unspecified site: Secondary | ICD-10-CM | POA: Diagnosis not present

## 2018-08-16 DIAGNOSIS — Z7984 Long term (current) use of oral hypoglycemic drugs: Secondary | ICD-10-CM | POA: Diagnosis not present

## 2018-08-16 DIAGNOSIS — R600 Localized edema: Secondary | ICD-10-CM | POA: Diagnosis not present

## 2018-08-16 DIAGNOSIS — Z87891 Personal history of nicotine dependence: Secondary | ICD-10-CM | POA: Diagnosis not present

## 2018-08-16 DIAGNOSIS — I482 Chronic atrial fibrillation, unspecified: Secondary | ICD-10-CM | POA: Diagnosis not present

## 2018-08-16 DIAGNOSIS — E1142 Type 2 diabetes mellitus with diabetic polyneuropathy: Secondary | ICD-10-CM | POA: Diagnosis not present

## 2018-08-17 DIAGNOSIS — R600 Localized edema: Secondary | ICD-10-CM | POA: Diagnosis not present

## 2018-08-17 DIAGNOSIS — Z7984 Long term (current) use of oral hypoglycemic drugs: Secondary | ICD-10-CM | POA: Diagnosis not present

## 2018-08-17 DIAGNOSIS — I482 Chronic atrial fibrillation, unspecified: Secondary | ICD-10-CM | POA: Diagnosis not present

## 2018-08-17 DIAGNOSIS — E1142 Type 2 diabetes mellitus with diabetic polyneuropathy: Secondary | ICD-10-CM | POA: Diagnosis not present

## 2018-08-17 DIAGNOSIS — M1990 Unspecified osteoarthritis, unspecified site: Secondary | ICD-10-CM | POA: Diagnosis not present

## 2018-08-17 DIAGNOSIS — I10 Essential (primary) hypertension: Secondary | ICD-10-CM | POA: Diagnosis not present

## 2018-08-17 DIAGNOSIS — Z87891 Personal history of nicotine dependence: Secondary | ICD-10-CM | POA: Diagnosis not present

## 2018-08-19 DIAGNOSIS — Z87891 Personal history of nicotine dependence: Secondary | ICD-10-CM | POA: Diagnosis not present

## 2018-08-19 DIAGNOSIS — E1142 Type 2 diabetes mellitus with diabetic polyneuropathy: Secondary | ICD-10-CM | POA: Diagnosis not present

## 2018-08-19 DIAGNOSIS — Z7984 Long term (current) use of oral hypoglycemic drugs: Secondary | ICD-10-CM | POA: Diagnosis not present

## 2018-08-19 DIAGNOSIS — I482 Chronic atrial fibrillation, unspecified: Secondary | ICD-10-CM | POA: Diagnosis not present

## 2018-08-19 DIAGNOSIS — M1990 Unspecified osteoarthritis, unspecified site: Secondary | ICD-10-CM | POA: Diagnosis not present

## 2018-08-19 DIAGNOSIS — R600 Localized edema: Secondary | ICD-10-CM | POA: Diagnosis not present

## 2018-08-19 DIAGNOSIS — I10 Essential (primary) hypertension: Secondary | ICD-10-CM | POA: Diagnosis not present

## 2018-08-20 DIAGNOSIS — R600 Localized edema: Secondary | ICD-10-CM | POA: Diagnosis not present

## 2018-08-20 DIAGNOSIS — Z7984 Long term (current) use of oral hypoglycemic drugs: Secondary | ICD-10-CM | POA: Diagnosis not present

## 2018-08-20 DIAGNOSIS — E1142 Type 2 diabetes mellitus with diabetic polyneuropathy: Secondary | ICD-10-CM | POA: Diagnosis not present

## 2018-08-20 DIAGNOSIS — I482 Chronic atrial fibrillation, unspecified: Secondary | ICD-10-CM | POA: Diagnosis not present

## 2018-08-20 DIAGNOSIS — M1990 Unspecified osteoarthritis, unspecified site: Secondary | ICD-10-CM | POA: Diagnosis not present

## 2018-08-20 DIAGNOSIS — I10 Essential (primary) hypertension: Secondary | ICD-10-CM | POA: Diagnosis not present

## 2018-08-20 DIAGNOSIS — Z87891 Personal history of nicotine dependence: Secondary | ICD-10-CM | POA: Diagnosis not present

## 2018-08-21 DIAGNOSIS — M1990 Unspecified osteoarthritis, unspecified site: Secondary | ICD-10-CM | POA: Diagnosis not present

## 2018-08-21 DIAGNOSIS — L97221 Non-pressure chronic ulcer of left calf limited to breakdown of skin: Secondary | ICD-10-CM | POA: Diagnosis not present

## 2018-08-21 DIAGNOSIS — I10 Essential (primary) hypertension: Secondary | ICD-10-CM | POA: Diagnosis not present

## 2018-08-21 DIAGNOSIS — Z7984 Long term (current) use of oral hypoglycemic drugs: Secondary | ICD-10-CM | POA: Diagnosis not present

## 2018-08-21 DIAGNOSIS — Z87891 Personal history of nicotine dependence: Secondary | ICD-10-CM | POA: Diagnosis not present

## 2018-08-21 DIAGNOSIS — R600 Localized edema: Secondary | ICD-10-CM | POA: Diagnosis not present

## 2018-08-21 DIAGNOSIS — I482 Chronic atrial fibrillation, unspecified: Secondary | ICD-10-CM | POA: Diagnosis not present

## 2018-08-21 DIAGNOSIS — E1142 Type 2 diabetes mellitus with diabetic polyneuropathy: Secondary | ICD-10-CM | POA: Diagnosis not present

## 2018-08-21 DIAGNOSIS — L2089 Other atopic dermatitis: Secondary | ICD-10-CM | POA: Diagnosis not present

## 2018-08-22 DIAGNOSIS — M1990 Unspecified osteoarthritis, unspecified site: Secondary | ICD-10-CM | POA: Diagnosis not present

## 2018-08-22 DIAGNOSIS — Z7984 Long term (current) use of oral hypoglycemic drugs: Secondary | ICD-10-CM | POA: Diagnosis not present

## 2018-08-22 DIAGNOSIS — Z87891 Personal history of nicotine dependence: Secondary | ICD-10-CM | POA: Diagnosis not present

## 2018-08-22 DIAGNOSIS — I10 Essential (primary) hypertension: Secondary | ICD-10-CM | POA: Diagnosis not present

## 2018-08-22 DIAGNOSIS — R600 Localized edema: Secondary | ICD-10-CM | POA: Diagnosis not present

## 2018-08-22 DIAGNOSIS — E1142 Type 2 diabetes mellitus with diabetic polyneuropathy: Secondary | ICD-10-CM | POA: Diagnosis not present

## 2018-08-22 DIAGNOSIS — I482 Chronic atrial fibrillation, unspecified: Secondary | ICD-10-CM | POA: Diagnosis not present

## 2018-08-24 DIAGNOSIS — Z7984 Long term (current) use of oral hypoglycemic drugs: Secondary | ICD-10-CM | POA: Diagnosis not present

## 2018-08-24 DIAGNOSIS — E1142 Type 2 diabetes mellitus with diabetic polyneuropathy: Secondary | ICD-10-CM | POA: Diagnosis not present

## 2018-08-24 DIAGNOSIS — Z87891 Personal history of nicotine dependence: Secondary | ICD-10-CM | POA: Diagnosis not present

## 2018-08-24 DIAGNOSIS — M1990 Unspecified osteoarthritis, unspecified site: Secondary | ICD-10-CM | POA: Diagnosis not present

## 2018-08-24 DIAGNOSIS — I10 Essential (primary) hypertension: Secondary | ICD-10-CM | POA: Diagnosis not present

## 2018-08-24 DIAGNOSIS — R600 Localized edema: Secondary | ICD-10-CM | POA: Diagnosis not present

## 2018-08-24 DIAGNOSIS — I482 Chronic atrial fibrillation, unspecified: Secondary | ICD-10-CM | POA: Diagnosis not present

## 2018-08-26 DIAGNOSIS — I10 Essential (primary) hypertension: Secondary | ICD-10-CM | POA: Diagnosis not present

## 2018-08-26 DIAGNOSIS — M1990 Unspecified osteoarthritis, unspecified site: Secondary | ICD-10-CM | POA: Diagnosis not present

## 2018-08-26 DIAGNOSIS — I482 Chronic atrial fibrillation, unspecified: Secondary | ICD-10-CM | POA: Diagnosis not present

## 2018-08-26 DIAGNOSIS — R600 Localized edema: Secondary | ICD-10-CM | POA: Diagnosis not present

## 2018-08-26 DIAGNOSIS — E1142 Type 2 diabetes mellitus with diabetic polyneuropathy: Secondary | ICD-10-CM | POA: Diagnosis not present

## 2018-08-26 DIAGNOSIS — Z7984 Long term (current) use of oral hypoglycemic drugs: Secondary | ICD-10-CM | POA: Diagnosis not present

## 2018-08-26 DIAGNOSIS — Z87891 Personal history of nicotine dependence: Secondary | ICD-10-CM | POA: Diagnosis not present

## 2018-08-27 DIAGNOSIS — Z87891 Personal history of nicotine dependence: Secondary | ICD-10-CM | POA: Diagnosis not present

## 2018-08-27 DIAGNOSIS — Z7984 Long term (current) use of oral hypoglycemic drugs: Secondary | ICD-10-CM | POA: Diagnosis not present

## 2018-08-27 DIAGNOSIS — R600 Localized edema: Secondary | ICD-10-CM | POA: Diagnosis not present

## 2018-08-27 DIAGNOSIS — I10 Essential (primary) hypertension: Secondary | ICD-10-CM | POA: Diagnosis not present

## 2018-08-27 DIAGNOSIS — M1990 Unspecified osteoarthritis, unspecified site: Secondary | ICD-10-CM | POA: Diagnosis not present

## 2018-08-27 DIAGNOSIS — E1142 Type 2 diabetes mellitus with diabetic polyneuropathy: Secondary | ICD-10-CM | POA: Diagnosis not present

## 2018-08-27 DIAGNOSIS — I482 Chronic atrial fibrillation, unspecified: Secondary | ICD-10-CM | POA: Diagnosis not present

## 2018-08-29 DIAGNOSIS — Z7984 Long term (current) use of oral hypoglycemic drugs: Secondary | ICD-10-CM | POA: Diagnosis not present

## 2018-08-29 DIAGNOSIS — E1142 Type 2 diabetes mellitus with diabetic polyneuropathy: Secondary | ICD-10-CM | POA: Diagnosis not present

## 2018-08-29 DIAGNOSIS — M1990 Unspecified osteoarthritis, unspecified site: Secondary | ICD-10-CM | POA: Diagnosis not present

## 2018-08-29 DIAGNOSIS — R600 Localized edema: Secondary | ICD-10-CM | POA: Diagnosis not present

## 2018-08-29 DIAGNOSIS — I482 Chronic atrial fibrillation, unspecified: Secondary | ICD-10-CM | POA: Diagnosis not present

## 2018-08-29 DIAGNOSIS — I10 Essential (primary) hypertension: Secondary | ICD-10-CM | POA: Diagnosis not present

## 2018-08-29 DIAGNOSIS — Z87891 Personal history of nicotine dependence: Secondary | ICD-10-CM | POA: Diagnosis not present

## 2018-09-02 DIAGNOSIS — Z7984 Long term (current) use of oral hypoglycemic drugs: Secondary | ICD-10-CM | POA: Diagnosis not present

## 2018-09-02 DIAGNOSIS — E1142 Type 2 diabetes mellitus with diabetic polyneuropathy: Secondary | ICD-10-CM | POA: Diagnosis not present

## 2018-09-02 DIAGNOSIS — Z87891 Personal history of nicotine dependence: Secondary | ICD-10-CM | POA: Diagnosis not present

## 2018-09-02 DIAGNOSIS — I10 Essential (primary) hypertension: Secondary | ICD-10-CM | POA: Diagnosis not present

## 2018-09-02 DIAGNOSIS — R600 Localized edema: Secondary | ICD-10-CM | POA: Diagnosis not present

## 2018-09-02 DIAGNOSIS — I482 Chronic atrial fibrillation, unspecified: Secondary | ICD-10-CM | POA: Diagnosis not present

## 2018-09-02 DIAGNOSIS — M1990 Unspecified osteoarthritis, unspecified site: Secondary | ICD-10-CM | POA: Diagnosis not present

## 2018-09-03 DIAGNOSIS — R600 Localized edema: Secondary | ICD-10-CM | POA: Diagnosis not present

## 2018-09-03 DIAGNOSIS — Z7984 Long term (current) use of oral hypoglycemic drugs: Secondary | ICD-10-CM | POA: Diagnosis not present

## 2018-09-03 DIAGNOSIS — Z87891 Personal history of nicotine dependence: Secondary | ICD-10-CM | POA: Diagnosis not present

## 2018-09-03 DIAGNOSIS — I10 Essential (primary) hypertension: Secondary | ICD-10-CM | POA: Diagnosis not present

## 2018-09-03 DIAGNOSIS — I482 Chronic atrial fibrillation, unspecified: Secondary | ICD-10-CM | POA: Diagnosis not present

## 2018-09-03 DIAGNOSIS — M1990 Unspecified osteoarthritis, unspecified site: Secondary | ICD-10-CM | POA: Diagnosis not present

## 2018-09-03 DIAGNOSIS — E1142 Type 2 diabetes mellitus with diabetic polyneuropathy: Secondary | ICD-10-CM | POA: Diagnosis not present

## 2018-09-04 DIAGNOSIS — L97221 Non-pressure chronic ulcer of left calf limited to breakdown of skin: Secondary | ICD-10-CM | POA: Diagnosis not present

## 2018-09-05 DIAGNOSIS — Z87891 Personal history of nicotine dependence: Secondary | ICD-10-CM | POA: Diagnosis not present

## 2018-09-05 DIAGNOSIS — I482 Chronic atrial fibrillation, unspecified: Secondary | ICD-10-CM | POA: Diagnosis not present

## 2018-09-05 DIAGNOSIS — R600 Localized edema: Secondary | ICD-10-CM | POA: Diagnosis not present

## 2018-09-05 DIAGNOSIS — Z7984 Long term (current) use of oral hypoglycemic drugs: Secondary | ICD-10-CM | POA: Diagnosis not present

## 2018-09-05 DIAGNOSIS — I10 Essential (primary) hypertension: Secondary | ICD-10-CM | POA: Diagnosis not present

## 2018-09-05 DIAGNOSIS — M1990 Unspecified osteoarthritis, unspecified site: Secondary | ICD-10-CM | POA: Diagnosis not present

## 2018-09-05 DIAGNOSIS — E1142 Type 2 diabetes mellitus with diabetic polyneuropathy: Secondary | ICD-10-CM | POA: Diagnosis not present

## 2018-09-06 DIAGNOSIS — Z87891 Personal history of nicotine dependence: Secondary | ICD-10-CM | POA: Diagnosis not present

## 2018-09-06 DIAGNOSIS — E1142 Type 2 diabetes mellitus with diabetic polyneuropathy: Secondary | ICD-10-CM | POA: Diagnosis not present

## 2018-09-06 DIAGNOSIS — Z7984 Long term (current) use of oral hypoglycemic drugs: Secondary | ICD-10-CM | POA: Diagnosis not present

## 2018-09-06 DIAGNOSIS — I482 Chronic atrial fibrillation, unspecified: Secondary | ICD-10-CM | POA: Diagnosis not present

## 2018-09-06 DIAGNOSIS — M1990 Unspecified osteoarthritis, unspecified site: Secondary | ICD-10-CM | POA: Diagnosis not present

## 2018-09-06 DIAGNOSIS — I10 Essential (primary) hypertension: Secondary | ICD-10-CM | POA: Diagnosis not present

## 2018-09-06 DIAGNOSIS — R600 Localized edema: Secondary | ICD-10-CM | POA: Diagnosis not present

## 2018-09-10 DIAGNOSIS — Z7984 Long term (current) use of oral hypoglycemic drugs: Secondary | ICD-10-CM | POA: Diagnosis not present

## 2018-09-10 DIAGNOSIS — E1142 Type 2 diabetes mellitus with diabetic polyneuropathy: Secondary | ICD-10-CM | POA: Diagnosis not present

## 2018-09-10 DIAGNOSIS — I10 Essential (primary) hypertension: Secondary | ICD-10-CM | POA: Diagnosis not present

## 2018-09-10 DIAGNOSIS — I482 Chronic atrial fibrillation, unspecified: Secondary | ICD-10-CM | POA: Diagnosis not present

## 2018-09-10 DIAGNOSIS — M1990 Unspecified osteoarthritis, unspecified site: Secondary | ICD-10-CM | POA: Diagnosis not present

## 2018-09-10 DIAGNOSIS — R600 Localized edema: Secondary | ICD-10-CM | POA: Diagnosis not present

## 2018-09-10 DIAGNOSIS — Z87891 Personal history of nicotine dependence: Secondary | ICD-10-CM | POA: Diagnosis not present

## 2018-09-11 DIAGNOSIS — S72001A Fracture of unspecified part of neck of right femur, initial encounter for closed fracture: Secondary | ICD-10-CM | POA: Diagnosis not present

## 2018-09-11 DIAGNOSIS — M1990 Unspecified osteoarthritis, unspecified site: Secondary | ICD-10-CM | POA: Diagnosis not present

## 2018-09-11 DIAGNOSIS — R600 Localized edema: Secondary | ICD-10-CM | POA: Diagnosis not present

## 2018-09-11 DIAGNOSIS — I10 Essential (primary) hypertension: Secondary | ICD-10-CM | POA: Diagnosis not present

## 2018-09-11 DIAGNOSIS — E1142 Type 2 diabetes mellitus with diabetic polyneuropathy: Secondary | ICD-10-CM | POA: Diagnosis not present

## 2018-09-11 DIAGNOSIS — Z87891 Personal history of nicotine dependence: Secondary | ICD-10-CM | POA: Diagnosis not present

## 2018-09-11 DIAGNOSIS — I482 Chronic atrial fibrillation, unspecified: Secondary | ICD-10-CM | POA: Diagnosis not present

## 2018-09-11 DIAGNOSIS — Z7984 Long term (current) use of oral hypoglycemic drugs: Secondary | ICD-10-CM | POA: Diagnosis not present

## 2018-09-16 DIAGNOSIS — E1142 Type 2 diabetes mellitus with diabetic polyneuropathy: Secondary | ICD-10-CM | POA: Diagnosis not present

## 2018-09-16 DIAGNOSIS — Z87891 Personal history of nicotine dependence: Secondary | ICD-10-CM | POA: Diagnosis not present

## 2018-09-16 DIAGNOSIS — I482 Chronic atrial fibrillation, unspecified: Secondary | ICD-10-CM | POA: Diagnosis not present

## 2018-09-16 DIAGNOSIS — M1990 Unspecified osteoarthritis, unspecified site: Secondary | ICD-10-CM | POA: Diagnosis not present

## 2018-09-16 DIAGNOSIS — Z7984 Long term (current) use of oral hypoglycemic drugs: Secondary | ICD-10-CM | POA: Diagnosis not present

## 2018-09-16 DIAGNOSIS — I10 Essential (primary) hypertension: Secondary | ICD-10-CM | POA: Diagnosis not present

## 2018-09-16 DIAGNOSIS — R600 Localized edema: Secondary | ICD-10-CM | POA: Diagnosis not present

## 2018-09-17 DIAGNOSIS — Z7984 Long term (current) use of oral hypoglycemic drugs: Secondary | ICD-10-CM | POA: Diagnosis not present

## 2018-09-17 DIAGNOSIS — I10 Essential (primary) hypertension: Secondary | ICD-10-CM | POA: Diagnosis not present

## 2018-09-17 DIAGNOSIS — E1142 Type 2 diabetes mellitus with diabetic polyneuropathy: Secondary | ICD-10-CM | POA: Diagnosis not present

## 2018-09-17 DIAGNOSIS — I482 Chronic atrial fibrillation, unspecified: Secondary | ICD-10-CM | POA: Diagnosis not present

## 2018-09-17 DIAGNOSIS — M1990 Unspecified osteoarthritis, unspecified site: Secondary | ICD-10-CM | POA: Diagnosis not present

## 2018-09-17 DIAGNOSIS — Z87891 Personal history of nicotine dependence: Secondary | ICD-10-CM | POA: Diagnosis not present

## 2018-09-17 DIAGNOSIS — R600 Localized edema: Secondary | ICD-10-CM | POA: Diagnosis not present

## 2018-09-18 DIAGNOSIS — I10 Essential (primary) hypertension: Secondary | ICD-10-CM | POA: Diagnosis not present

## 2018-09-18 DIAGNOSIS — Z7984 Long term (current) use of oral hypoglycemic drugs: Secondary | ICD-10-CM | POA: Diagnosis not present

## 2018-09-18 DIAGNOSIS — I482 Chronic atrial fibrillation, unspecified: Secondary | ICD-10-CM | POA: Diagnosis not present

## 2018-09-18 DIAGNOSIS — R600 Localized edema: Secondary | ICD-10-CM | POA: Diagnosis not present

## 2018-09-18 DIAGNOSIS — E1142 Type 2 diabetes mellitus with diabetic polyneuropathy: Secondary | ICD-10-CM | POA: Diagnosis not present

## 2018-09-18 DIAGNOSIS — Z87891 Personal history of nicotine dependence: Secondary | ICD-10-CM | POA: Diagnosis not present

## 2018-09-18 DIAGNOSIS — M1990 Unspecified osteoarthritis, unspecified site: Secondary | ICD-10-CM | POA: Diagnosis not present

## 2018-09-24 DIAGNOSIS — E1142 Type 2 diabetes mellitus with diabetic polyneuropathy: Secondary | ICD-10-CM | POA: Diagnosis not present

## 2018-09-24 DIAGNOSIS — Z7984 Long term (current) use of oral hypoglycemic drugs: Secondary | ICD-10-CM | POA: Diagnosis not present

## 2018-09-24 DIAGNOSIS — Z87891 Personal history of nicotine dependence: Secondary | ICD-10-CM | POA: Diagnosis not present

## 2018-09-24 DIAGNOSIS — I482 Chronic atrial fibrillation, unspecified: Secondary | ICD-10-CM | POA: Diagnosis not present

## 2018-09-24 DIAGNOSIS — I10 Essential (primary) hypertension: Secondary | ICD-10-CM | POA: Diagnosis not present

## 2018-09-24 DIAGNOSIS — R600 Localized edema: Secondary | ICD-10-CM | POA: Diagnosis not present

## 2018-09-24 DIAGNOSIS — M1990 Unspecified osteoarthritis, unspecified site: Secondary | ICD-10-CM | POA: Diagnosis not present

## 2018-10-02 DIAGNOSIS — S72009A Fracture of unspecified part of neck of unspecified femur, initial encounter for closed fracture: Secondary | ICD-10-CM | POA: Diagnosis not present

## 2018-10-03 DIAGNOSIS — I482 Chronic atrial fibrillation, unspecified: Secondary | ICD-10-CM | POA: Diagnosis not present

## 2018-10-03 DIAGNOSIS — M1990 Unspecified osteoarthritis, unspecified site: Secondary | ICD-10-CM | POA: Diagnosis not present

## 2018-10-03 DIAGNOSIS — R600 Localized edema: Secondary | ICD-10-CM | POA: Diagnosis not present

## 2018-10-03 DIAGNOSIS — E1142 Type 2 diabetes mellitus with diabetic polyneuropathy: Secondary | ICD-10-CM | POA: Diagnosis not present

## 2018-10-03 DIAGNOSIS — Z87891 Personal history of nicotine dependence: Secondary | ICD-10-CM | POA: Diagnosis not present

## 2018-10-03 DIAGNOSIS — Z7984 Long term (current) use of oral hypoglycemic drugs: Secondary | ICD-10-CM | POA: Diagnosis not present

## 2018-10-03 DIAGNOSIS — I10 Essential (primary) hypertension: Secondary | ICD-10-CM | POA: Diagnosis not present

## 2018-10-07 DIAGNOSIS — R600 Localized edema: Secondary | ICD-10-CM | POA: Diagnosis not present

## 2018-10-07 DIAGNOSIS — M1990 Unspecified osteoarthritis, unspecified site: Secondary | ICD-10-CM | POA: Diagnosis not present

## 2018-10-07 DIAGNOSIS — I10 Essential (primary) hypertension: Secondary | ICD-10-CM | POA: Diagnosis not present

## 2018-10-07 DIAGNOSIS — I482 Chronic atrial fibrillation, unspecified: Secondary | ICD-10-CM | POA: Diagnosis not present

## 2018-10-07 DIAGNOSIS — E1142 Type 2 diabetes mellitus with diabetic polyneuropathy: Secondary | ICD-10-CM | POA: Diagnosis not present

## 2018-10-07 DIAGNOSIS — Z7984 Long term (current) use of oral hypoglycemic drugs: Secondary | ICD-10-CM | POA: Diagnosis not present

## 2018-10-07 DIAGNOSIS — E1169 Type 2 diabetes mellitus with other specified complication: Secondary | ICD-10-CM | POA: Diagnosis not present

## 2018-10-07 DIAGNOSIS — Z87891 Personal history of nicotine dependence: Secondary | ICD-10-CM | POA: Diagnosis not present

## 2018-10-08 DIAGNOSIS — I482 Chronic atrial fibrillation, unspecified: Secondary | ICD-10-CM | POA: Diagnosis not present

## 2018-10-08 DIAGNOSIS — I10 Essential (primary) hypertension: Secondary | ICD-10-CM | POA: Diagnosis not present

## 2018-10-08 DIAGNOSIS — E1142 Type 2 diabetes mellitus with diabetic polyneuropathy: Secondary | ICD-10-CM | POA: Diagnosis not present

## 2018-10-08 DIAGNOSIS — R600 Localized edema: Secondary | ICD-10-CM | POA: Diagnosis not present

## 2018-10-08 DIAGNOSIS — Z7984 Long term (current) use of oral hypoglycemic drugs: Secondary | ICD-10-CM | POA: Diagnosis not present

## 2018-10-08 DIAGNOSIS — Z87891 Personal history of nicotine dependence: Secondary | ICD-10-CM | POA: Diagnosis not present

## 2018-10-08 DIAGNOSIS — M1990 Unspecified osteoarthritis, unspecified site: Secondary | ICD-10-CM | POA: Diagnosis not present

## 2018-10-10 IMAGING — CR DG ANKLE COMPLETE 3+V*R*
3 series · 3 of 3 positions shown · non-contrast
Comparison: None.

CLINICAL DATA: Right ankle pain after slip and fall.  Swelling.

EXAM:
RIGHT ANKLE - COMPLETE 3+ VIEW

[x ankle ap right]
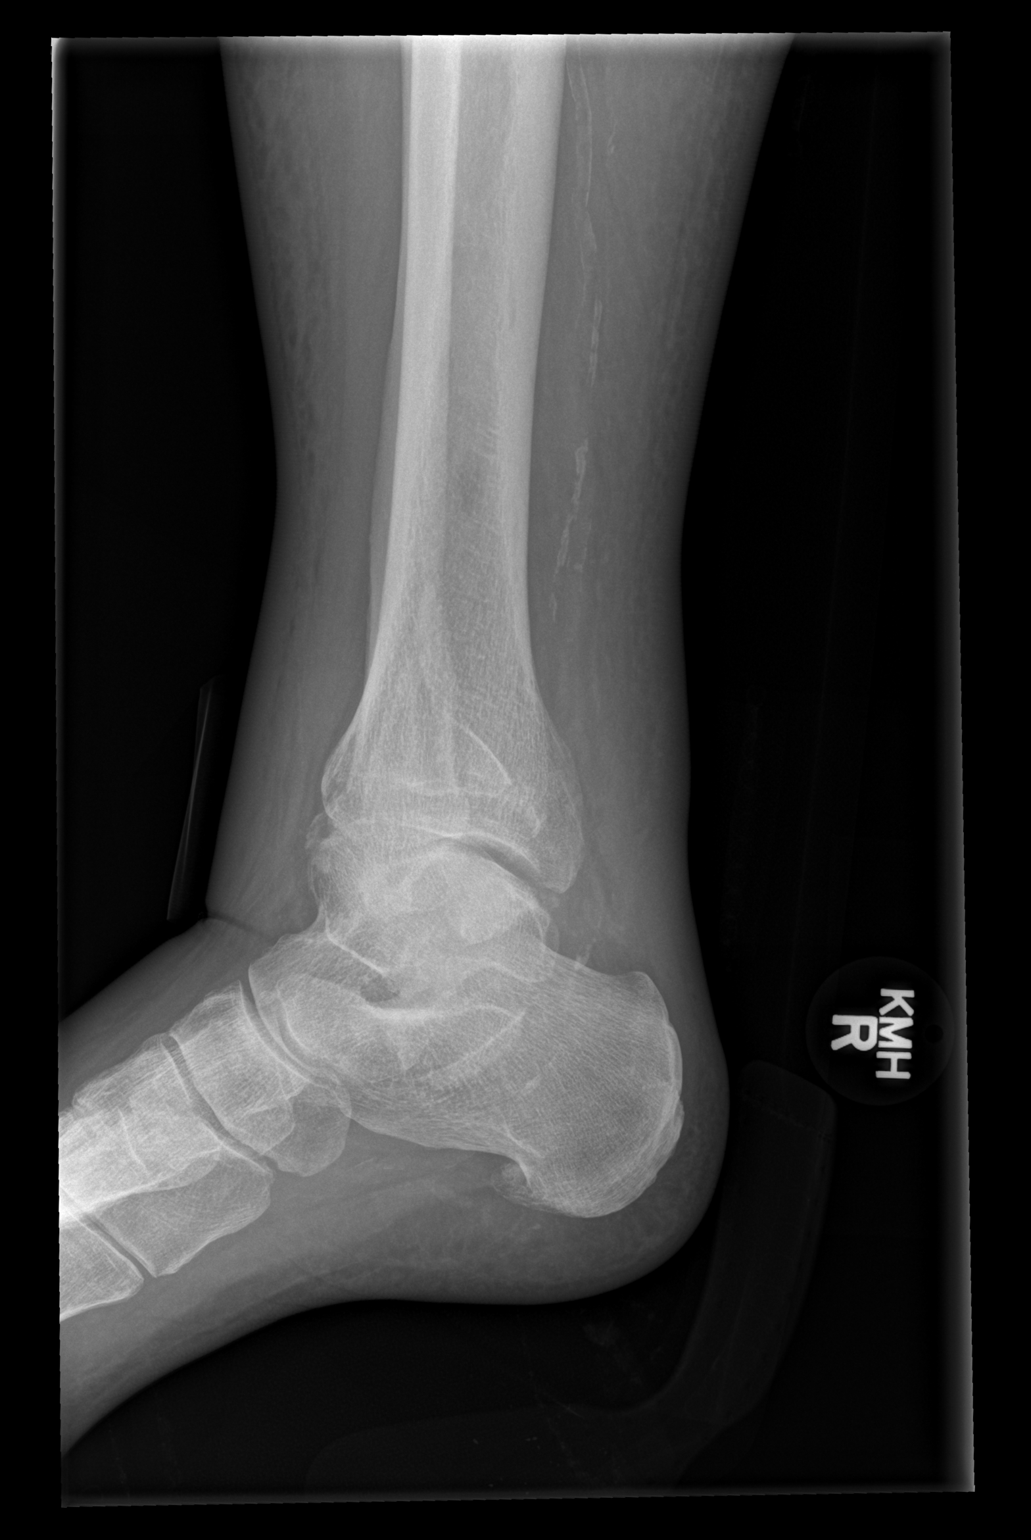

[x ankle obl right]
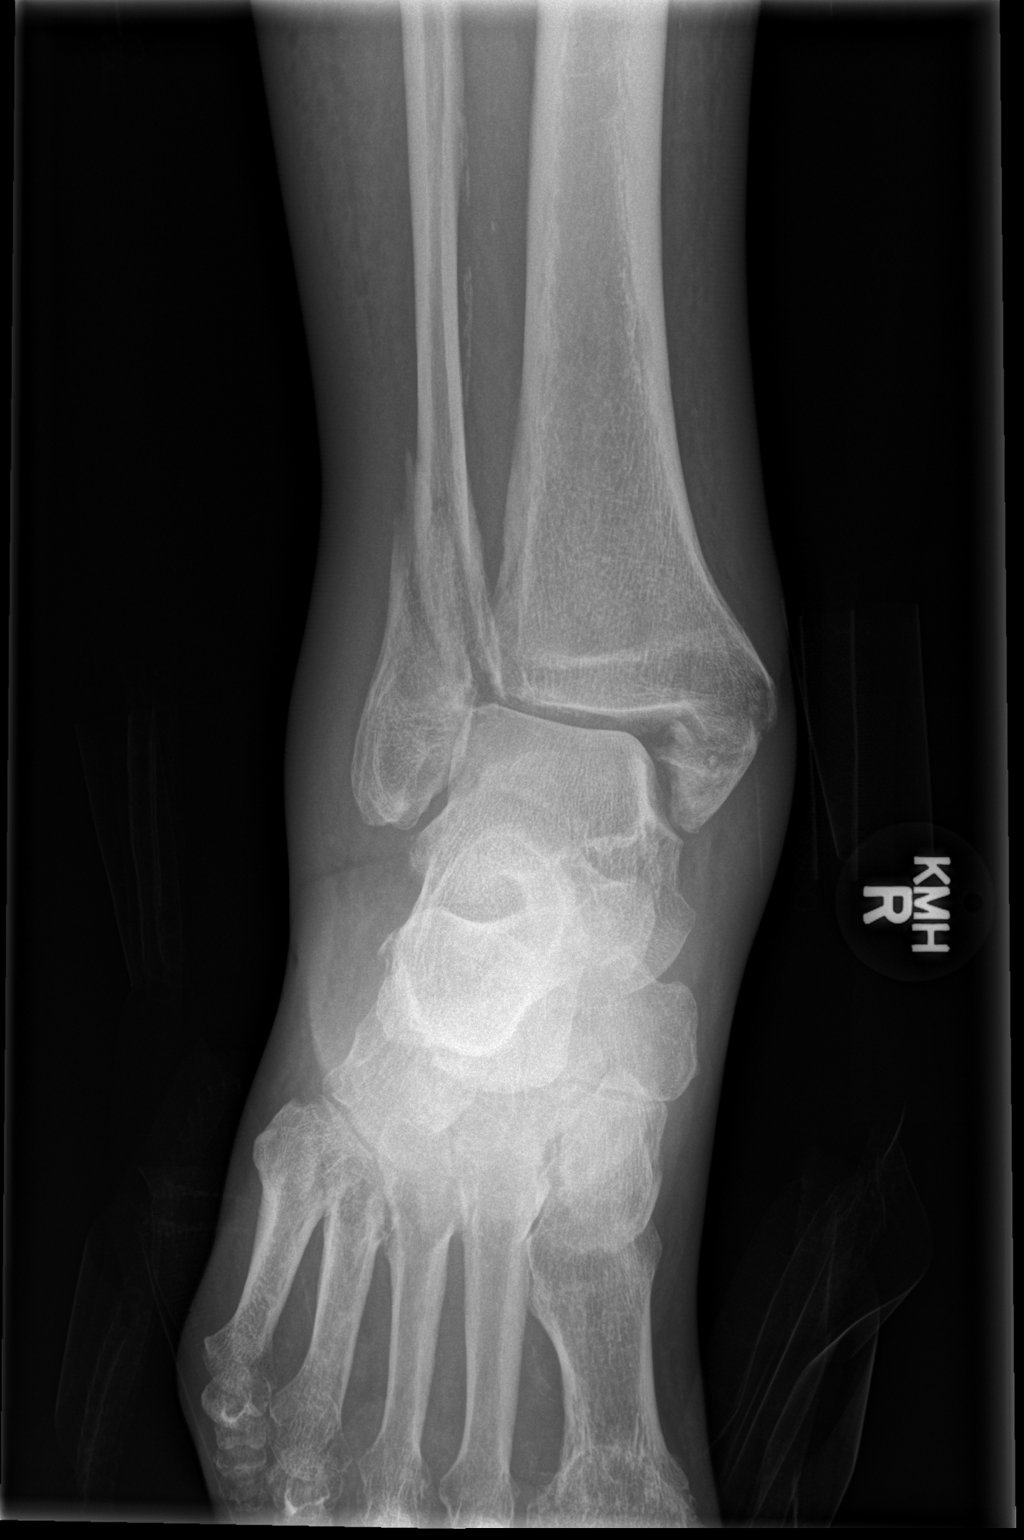

[x ankle lat right]
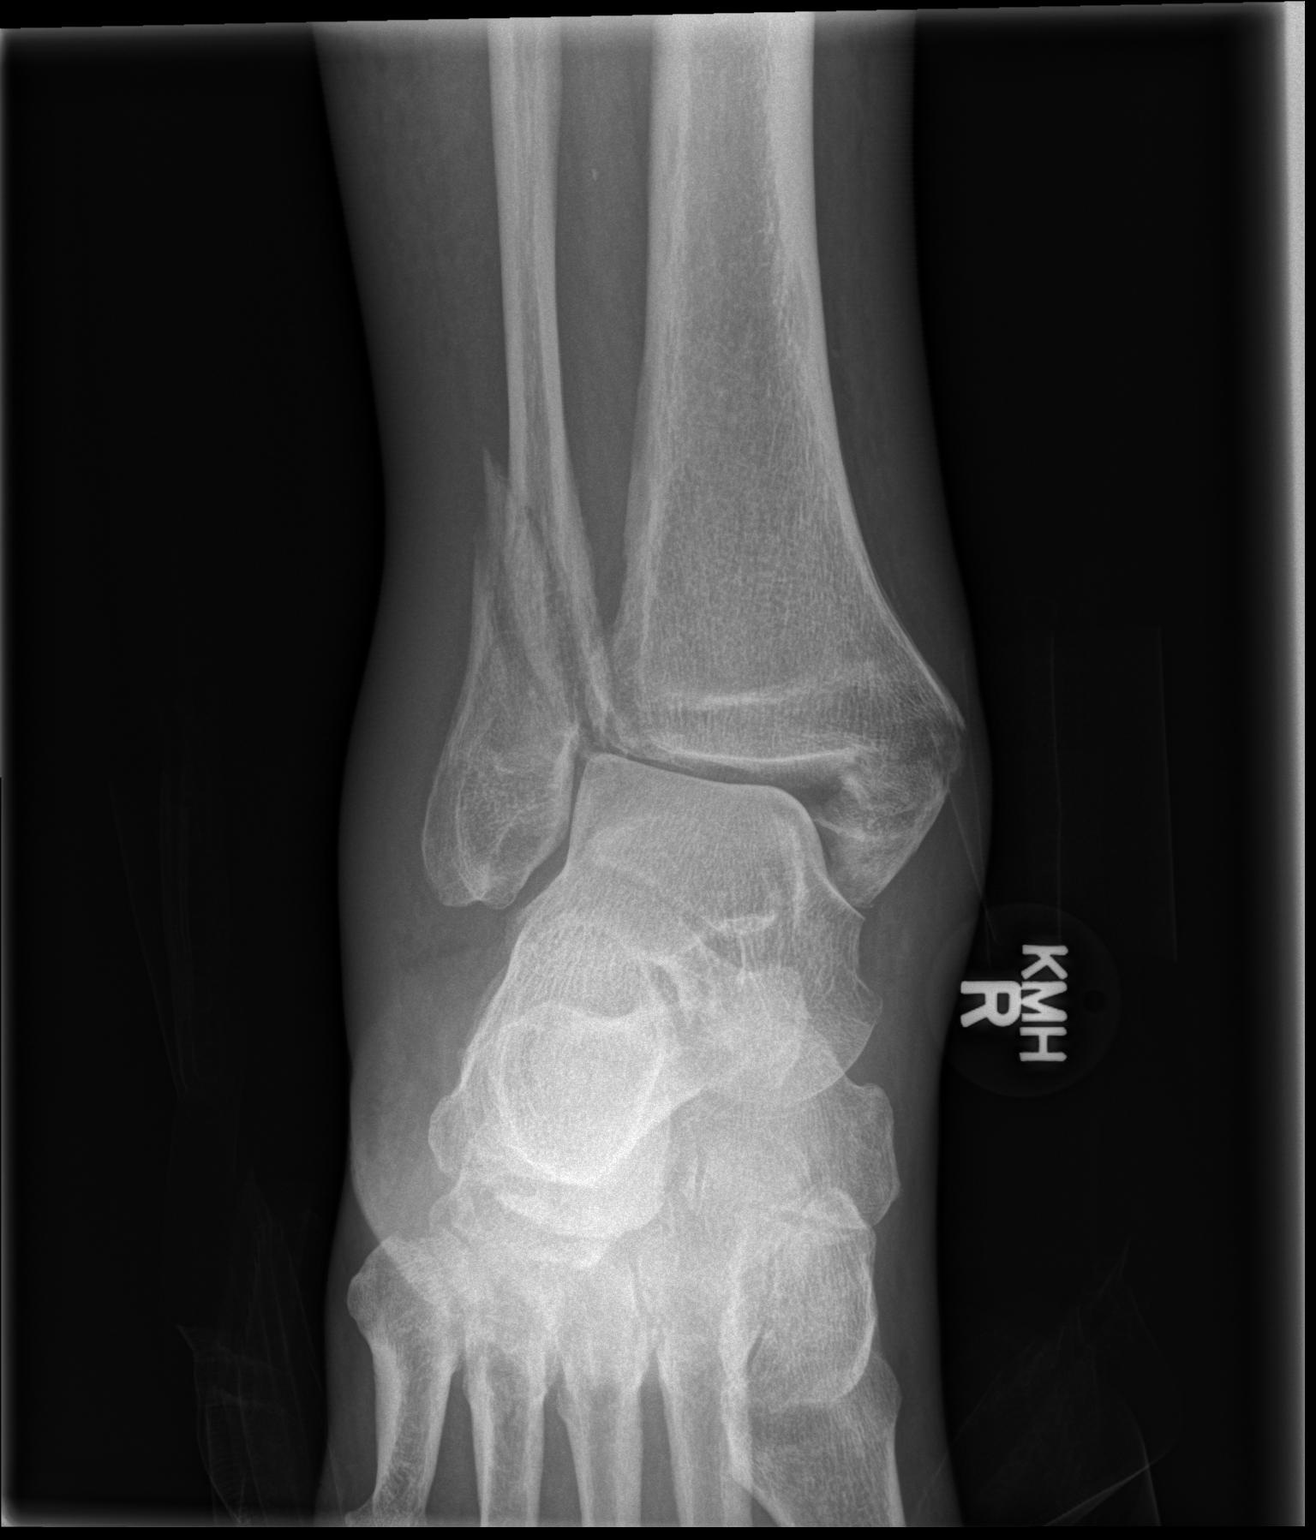

[3 of 3 positions shown; findings below may reference images not displayed]

FINDINGS: Weber B fracture pattern of the ankle with oblique lateral malleolar
fracture, transverse medial malleolar fracture, and questionable
posterior malleolar fracture. The tibial shaft is subluxed about 10
mm medially with respect to the talus. Surrounding soft tissue
swelling noted.

Plantar calcaneal spur.  Vascular calcifications noted.
IMPRESSION: 1. Weber B fracture pattern with fractures of the lateral and medial
malleolus and probable fracture of the posterior malleolus given the
medial subluxation of the tibia with respect to the talus. Unstable
fracture.

## 2018-10-11 IMAGING — CR DG ANKLE COMPLETE 3+V*R*
3 series · 3 of 3 positions shown · non-contrast
Comparison: 06/30/2016

CLINICAL DATA: Status post ankle fracture.

EXAM:
RIGHT ANKLE - COMPLETE 3+ VIEW

[ap]
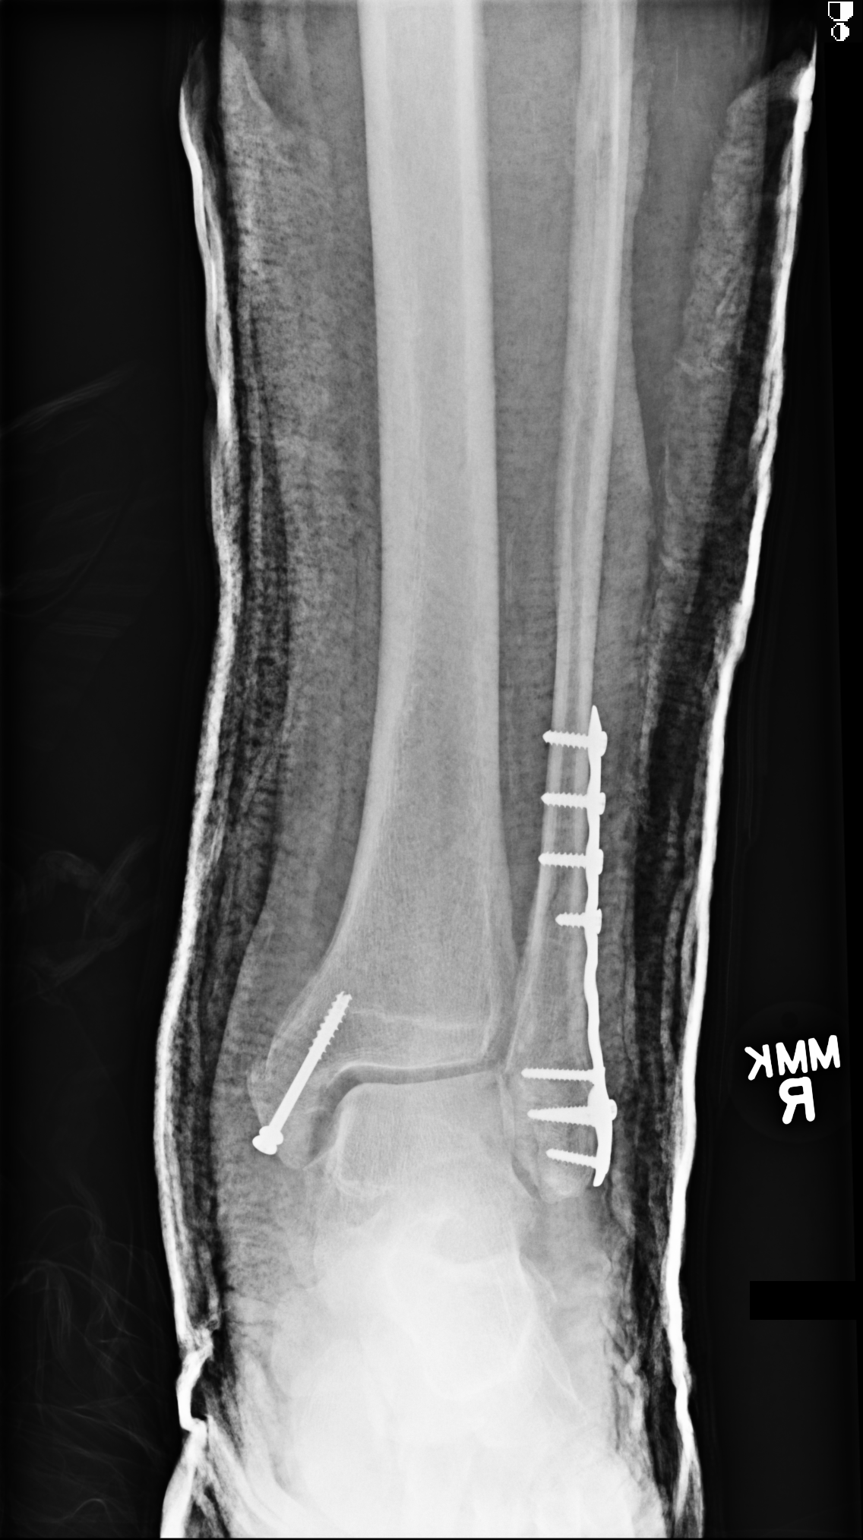

[lat]
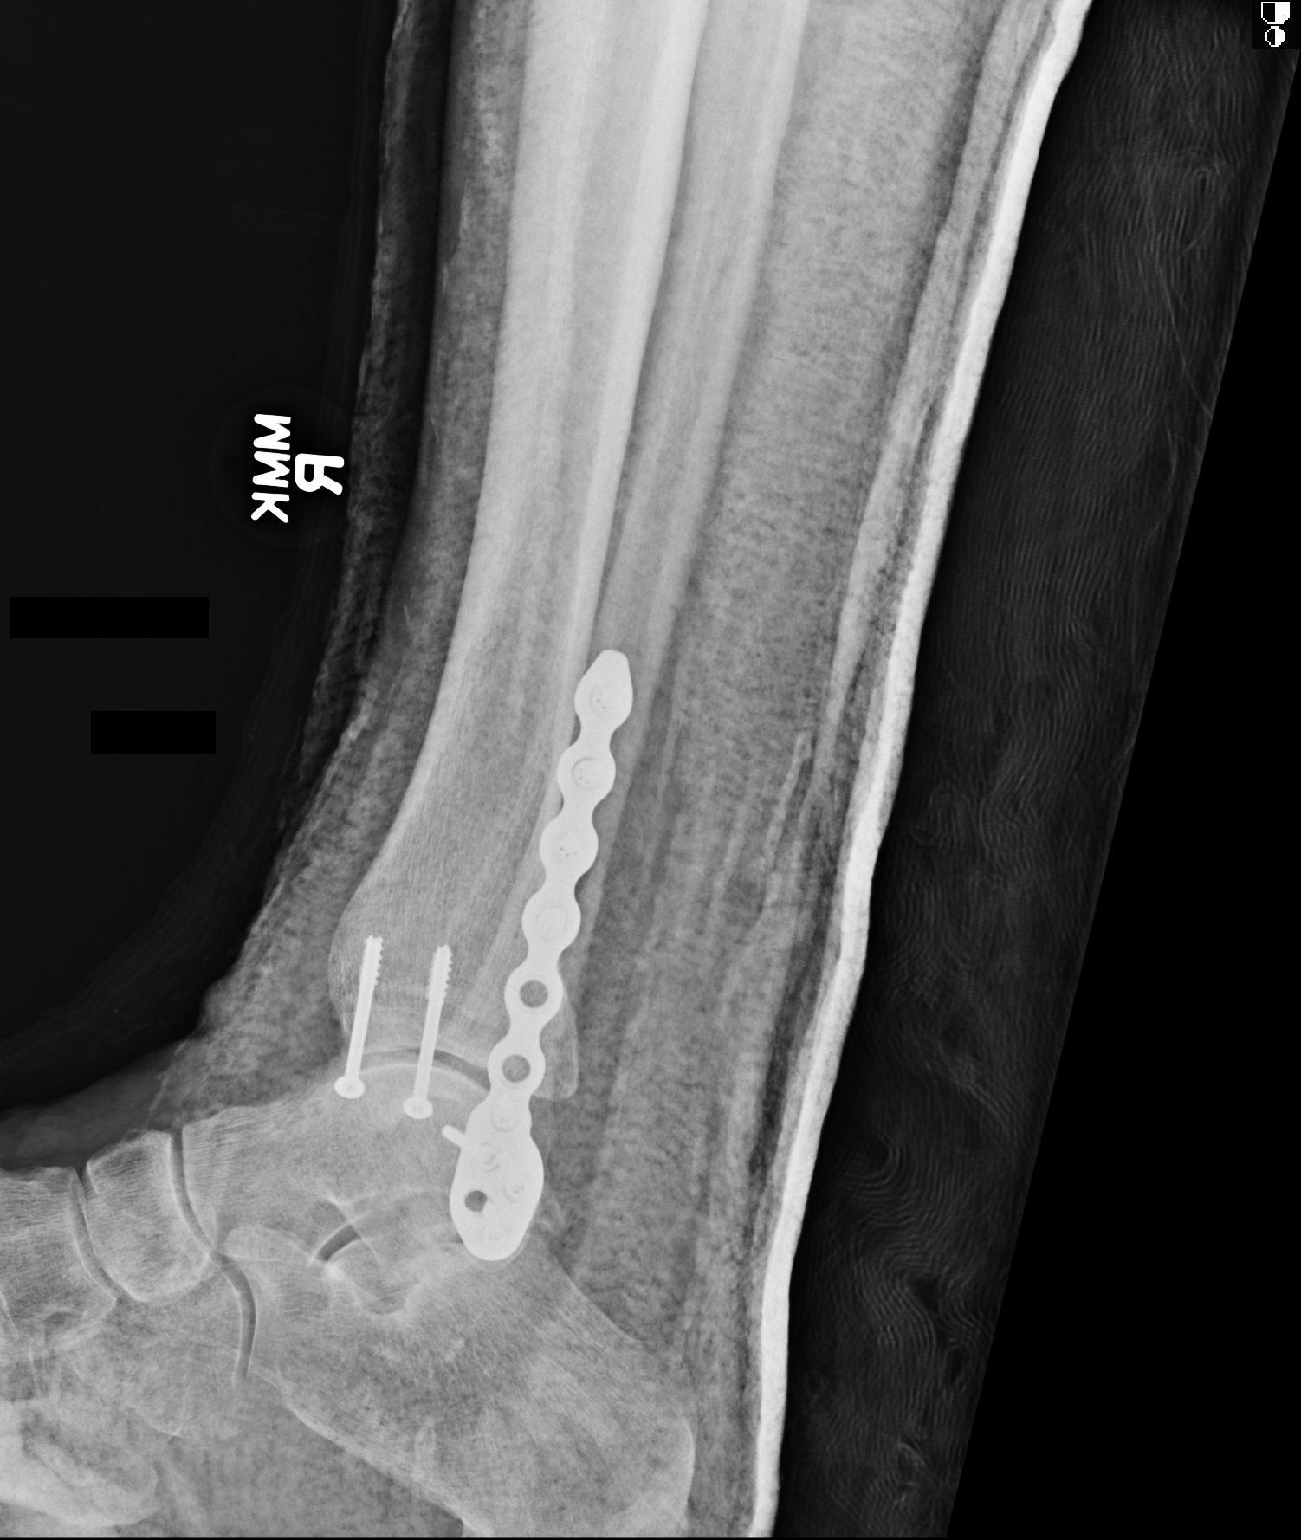

[oblique]
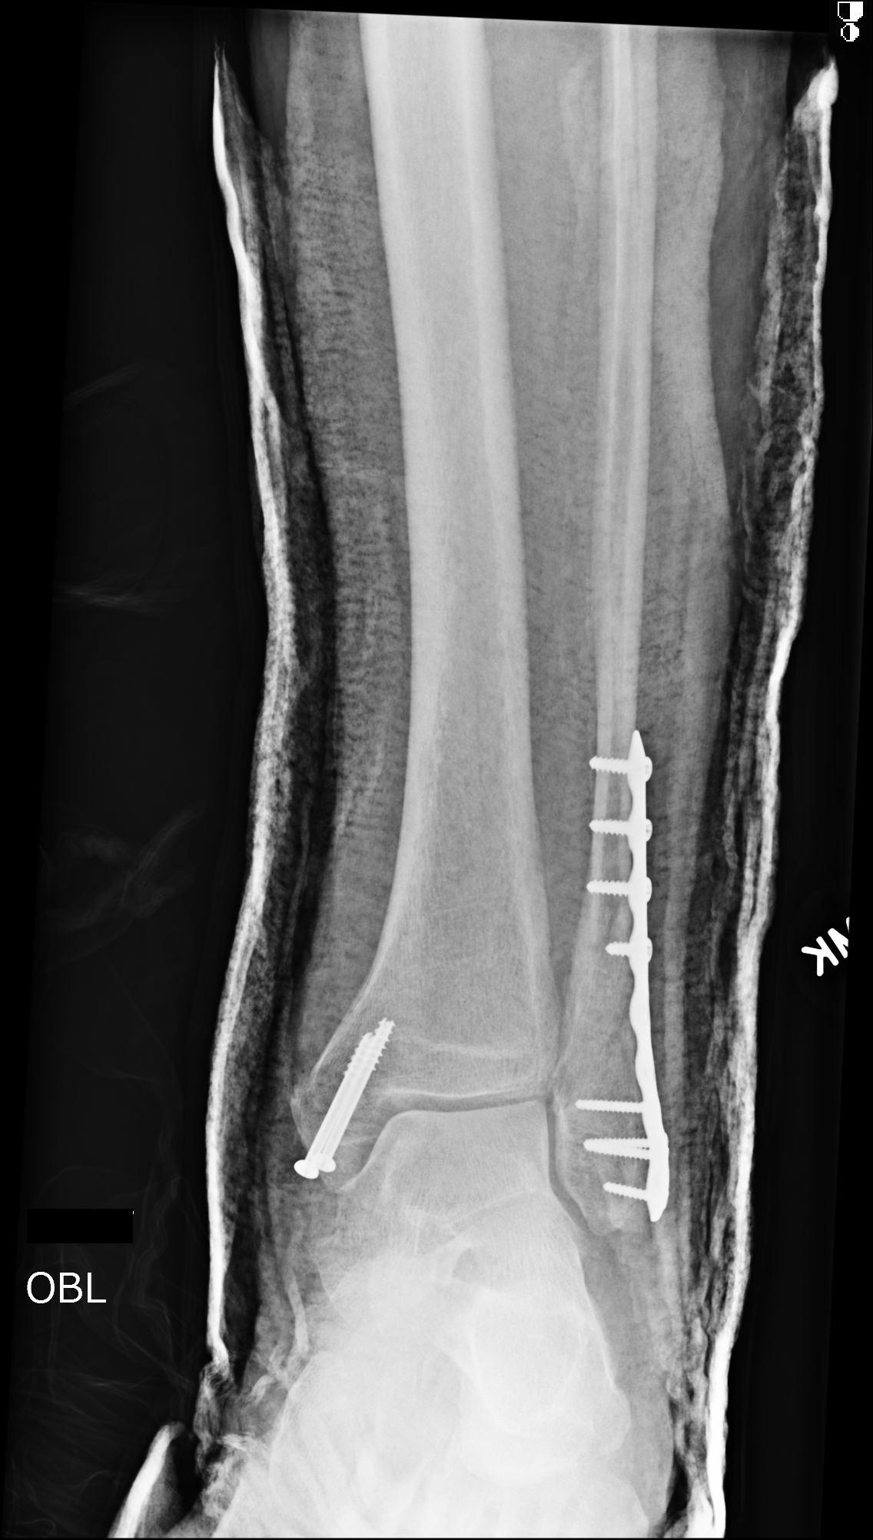

[3 of 3 positions shown; findings below may reference images not displayed]

FINDINGS: Exam detail is diminished secondary to overlying casting material.
Sideplate and screw device is identified reducing the distal fibular
fracture. Two screws reduces the medial malleolar fracture. The
hardware components and fracture fragments are in anatomic
alignment.
IMPRESSION: 1. Status post ORIF of fractures involving the distal fibula and
medial malleolus.

## 2018-10-12 IMAGING — CR DG CHEST 1V PORT
1 series · 1 of 1 positions shown · non-contrast
Comparison: 08/05/2014

CLINICAL DATA: Hypoxia

EXAM:
PORTABLE CHEST 1 VIEW

[AP]
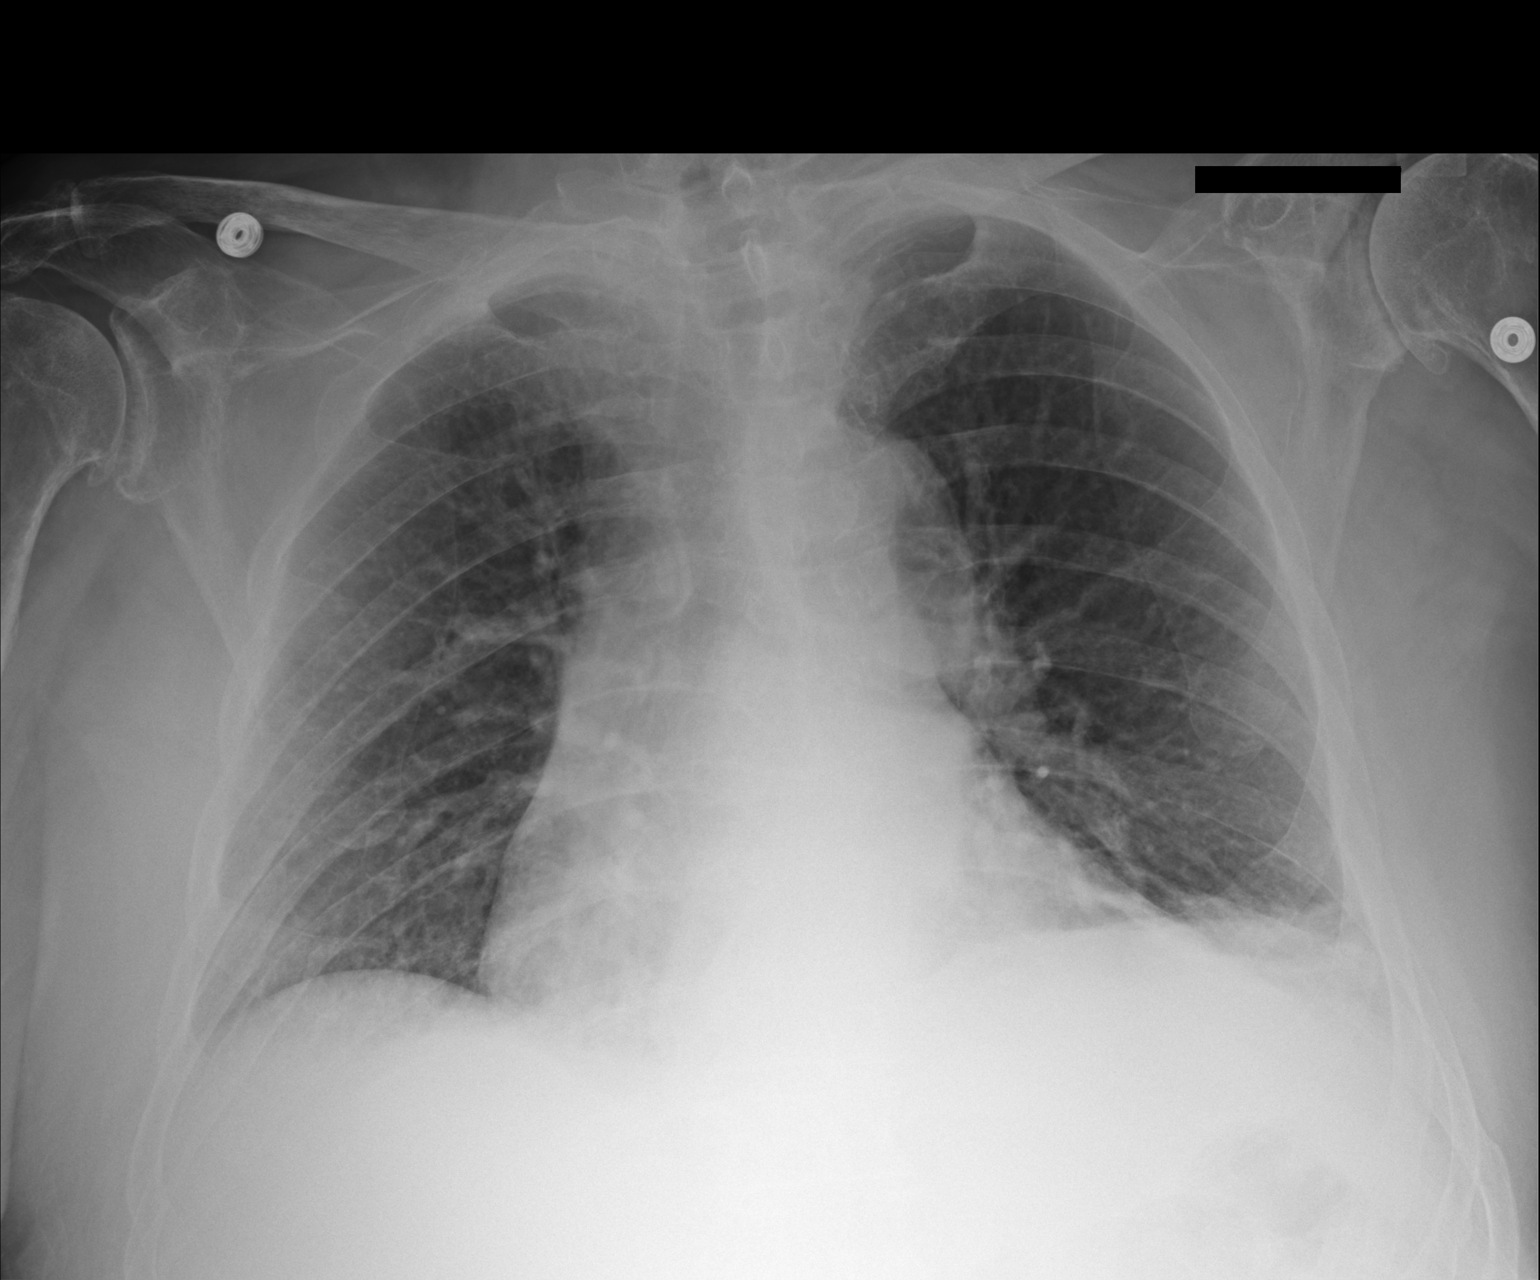

[1 of 1 positions shown; findings below may reference images not displayed]

FINDINGS: Heart size is normal. The aorta is unfolded. There is left lower
lobe atelectasis/pneumonia. The remainder the chest is clear. The
vascularity is normal. No effusions.
IMPRESSION: Left lower lobe atelectasis/ pneumonia.

## 2018-10-14 DIAGNOSIS — I10 Essential (primary) hypertension: Secondary | ICD-10-CM | POA: Diagnosis not present

## 2018-10-14 DIAGNOSIS — I482 Chronic atrial fibrillation, unspecified: Secondary | ICD-10-CM | POA: Diagnosis not present

## 2018-10-14 DIAGNOSIS — Z87891 Personal history of nicotine dependence: Secondary | ICD-10-CM | POA: Diagnosis not present

## 2018-10-14 DIAGNOSIS — M1990 Unspecified osteoarthritis, unspecified site: Secondary | ICD-10-CM | POA: Diagnosis not present

## 2018-10-14 DIAGNOSIS — R600 Localized edema: Secondary | ICD-10-CM | POA: Diagnosis not present

## 2018-10-14 DIAGNOSIS — E1142 Type 2 diabetes mellitus with diabetic polyneuropathy: Secondary | ICD-10-CM | POA: Diagnosis not present

## 2018-10-14 DIAGNOSIS — Z7984 Long term (current) use of oral hypoglycemic drugs: Secondary | ICD-10-CM | POA: Diagnosis not present

## 2018-10-16 DIAGNOSIS — L97219 Non-pressure chronic ulcer of right calf with unspecified severity: Secondary | ICD-10-CM | POA: Diagnosis not present

## 2018-10-16 DIAGNOSIS — L97221 Non-pressure chronic ulcer of left calf limited to breakdown of skin: Secondary | ICD-10-CM | POA: Diagnosis not present

## 2018-10-16 DIAGNOSIS — Z1159 Encounter for screening for other viral diseases: Secondary | ICD-10-CM | POA: Diagnosis not present

## 2018-10-22 DIAGNOSIS — L97219 Non-pressure chronic ulcer of right calf with unspecified severity: Secondary | ICD-10-CM | POA: Diagnosis not present

## 2018-10-22 DIAGNOSIS — L97221 Non-pressure chronic ulcer of left calf limited to breakdown of skin: Secondary | ICD-10-CM | POA: Diagnosis not present

## 2018-10-30 DIAGNOSIS — L97221 Non-pressure chronic ulcer of left calf limited to breakdown of skin: Secondary | ICD-10-CM | POA: Diagnosis not present

## 2018-10-30 DIAGNOSIS — Z6827 Body mass index (BMI) 27.0-27.9, adult: Secondary | ICD-10-CM | POA: Diagnosis not present

## 2018-10-30 DIAGNOSIS — I482 Chronic atrial fibrillation, unspecified: Secondary | ICD-10-CM | POA: Diagnosis not present

## 2018-10-30 DIAGNOSIS — E1169 Type 2 diabetes mellitus with other specified complication: Secondary | ICD-10-CM | POA: Diagnosis not present

## 2018-10-30 DIAGNOSIS — I10 Essential (primary) hypertension: Secondary | ICD-10-CM | POA: Diagnosis not present

## 2018-10-30 DIAGNOSIS — E1142 Type 2 diabetes mellitus with diabetic polyneuropathy: Secondary | ICD-10-CM | POA: Diagnosis not present

## 2018-10-30 DIAGNOSIS — L97219 Non-pressure chronic ulcer of right calf with unspecified severity: Secondary | ICD-10-CM | POA: Diagnosis not present

## 2018-11-07 DIAGNOSIS — E1169 Type 2 diabetes mellitus with other specified complication: Secondary | ICD-10-CM | POA: Diagnosis not present

## 2018-11-11 DIAGNOSIS — L97221 Non-pressure chronic ulcer of left calf limited to breakdown of skin: Secondary | ICD-10-CM | POA: Diagnosis not present

## 2018-11-20 DIAGNOSIS — L97221 Non-pressure chronic ulcer of left calf limited to breakdown of skin: Secondary | ICD-10-CM | POA: Diagnosis not present

## 2018-11-25 DIAGNOSIS — Z1159 Encounter for screening for other viral diseases: Secondary | ICD-10-CM | POA: Diagnosis not present

## 2018-11-25 DIAGNOSIS — R5383 Other fatigue: Secondary | ICD-10-CM | POA: Diagnosis not present

## 2018-11-25 DIAGNOSIS — L97221 Non-pressure chronic ulcer of left calf limited to breakdown of skin: Secondary | ICD-10-CM | POA: Diagnosis not present

## 2018-11-29 DIAGNOSIS — L97221 Non-pressure chronic ulcer of left calf limited to breakdown of skin: Secondary | ICD-10-CM | POA: Diagnosis not present

## 2018-11-29 DIAGNOSIS — Z1159 Encounter for screening for other viral diseases: Secondary | ICD-10-CM | POA: Diagnosis not present

## 2018-12-03 DIAGNOSIS — Z1159 Encounter for screening for other viral diseases: Secondary | ICD-10-CM | POA: Diagnosis not present

## 2018-12-03 DIAGNOSIS — L97221 Non-pressure chronic ulcer of left calf limited to breakdown of skin: Secondary | ICD-10-CM | POA: Diagnosis not present

## 2018-12-09 DIAGNOSIS — E1169 Type 2 diabetes mellitus with other specified complication: Secondary | ICD-10-CM | POA: Diagnosis not present

## 2019-01-09 DIAGNOSIS — E1169 Type 2 diabetes mellitus with other specified complication: Secondary | ICD-10-CM | POA: Diagnosis not present

## 2019-01-14 DIAGNOSIS — Z87891 Personal history of nicotine dependence: Secondary | ICD-10-CM | POA: Diagnosis not present

## 2019-01-14 DIAGNOSIS — Z8781 Personal history of (healed) traumatic fracture: Secondary | ICD-10-CM | POA: Diagnosis not present

## 2019-01-14 DIAGNOSIS — Z9181 History of falling: Secondary | ICD-10-CM | POA: Diagnosis not present

## 2019-01-14 DIAGNOSIS — Z96653 Presence of artificial knee joint, bilateral: Secondary | ICD-10-CM | POA: Diagnosis not present

## 2019-02-06 DIAGNOSIS — I10 Essential (primary) hypertension: Secondary | ICD-10-CM | POA: Diagnosis not present

## 2019-02-06 DIAGNOSIS — E1142 Type 2 diabetes mellitus with diabetic polyneuropathy: Secondary | ICD-10-CM | POA: Diagnosis not present

## 2019-02-06 DIAGNOSIS — I482 Chronic atrial fibrillation, unspecified: Secondary | ICD-10-CM | POA: Diagnosis not present

## 2019-02-06 DIAGNOSIS — E559 Vitamin D deficiency, unspecified: Secondary | ICD-10-CM | POA: Diagnosis not present

## 2019-02-06 DIAGNOSIS — Z6828 Body mass index (BMI) 28.0-28.9, adult: Secondary | ICD-10-CM | POA: Diagnosis not present

## 2019-02-06 DIAGNOSIS — E782 Mixed hyperlipidemia: Secondary | ICD-10-CM | POA: Diagnosis not present

## 2019-02-06 DIAGNOSIS — Z1159 Encounter for screening for other viral diseases: Secondary | ICD-10-CM | POA: Diagnosis not present

## 2019-02-06 DIAGNOSIS — H906 Mixed conductive and sensorineural hearing loss, bilateral: Secondary | ICD-10-CM | POA: Diagnosis not present

## 2019-02-10 DIAGNOSIS — E1169 Type 2 diabetes mellitus with other specified complication: Secondary | ICD-10-CM | POA: Diagnosis not present

## 2019-02-26 DIAGNOSIS — H5203 Hypermetropia, bilateral: Secondary | ICD-10-CM | POA: Diagnosis not present

## 2019-02-26 DIAGNOSIS — H35033 Hypertensive retinopathy, bilateral: Secondary | ICD-10-CM | POA: Diagnosis not present

## 2019-03-13 DIAGNOSIS — E1169 Type 2 diabetes mellitus with other specified complication: Secondary | ICD-10-CM | POA: Diagnosis not present

## 2019-04-04 ENCOUNTER — Telehealth: Payer: Self-pay | Admitting: Cardiovascular Disease

## 2019-04-04 NOTE — Telephone Encounter (Signed)
Fine with me. -W

## 2019-04-04 NOTE — Telephone Encounter (Signed)
New Message:     Niece(Laurene) called and said she will need to come in with pt for his appt on 04-09-19 with Dr Audie Box. Pt has a hard time understanding.

## 2019-04-04 NOTE — Telephone Encounter (Signed)
Will route to MD to make aware.

## 2019-04-07 NOTE — Progress Notes (Signed)
Cardiology Office Note:   Date:  04/09/2019  NAME:  Glen Lee    MRN: [id number] DOB:  05/04/1934   PCP:  [name], MD  Cardiologist:  Evalina Field, MD   Referring MD: [name],*   Chief Complaint  Patient presents with  . Atrial Fibrillation   History of Present Illness:   Glen Lee is a 85 y.o. Lee with a hx of diabetes, atrial fibrillation, hypertension, hyperlipidemia who is being seen today for the evaluation of atrial fibrillation at the request of [name], MD.  He reports he has had atrial fibrillation for 40 years.  He is not taking any anticoagulation for what I can tell.  He reports he is tried these in the past, but has not been able to tolerate them.  Medical history is significant for hypertension that seems to be well controlled per records from outside physician.  He has hyperlipidemia with a recent LDL of 59.  His diabetes is controlled on oral hypoglycemic agents with A1c 6.7.  He has never had a myocardial infarction or stroke.  He is retired.  He reports that his niece takes care of him.  His children live up [place].  He is from [facility].  He reports he gets no chest pain or shortness of breath with exertion.  He states he is relatively inactive and mainly stays around the house.  His mother strenuous activity is walking to get into the car.  He does not climb stairs and use a walker.  He is quite frail and there are concerns for fall risk.  He is not had any major falls from what I can tell.  He denies any chest pain or shortness of breath with his activities.  EKG demonstrates right bundle branch block with left anterior fascicular block and atrial fibrillation with heart rate in 59.  He reports he gets significant lower extremity edema and takes Lasix 20 mg daily.  He does elevate his legs and this is improved.  He reports his edema is worse today because he did not take his Lasix as he was coming to the  doctor's office today.  Past Medical History: Past Medical History:  Diagnosis Date  . Anemia   . Arthritis   . Atrial fibrillation ([place])   . Diabetes mellitus without complication ([place])   . Edema    right leg  . Enlarged prostate   . Esophageal reflux   . Hyperlipidemia   . Hypertension   . Tremor    right hand  . Vitamin D deficiency     Past Surgical History: Past Surgical History:  Procedure Laterality Date  . JOINT REPLACEMENT Left    knee  . KNEE SURGERY    . ORIF ANKLE FRACTURE Right 06/30/2016   Procedure: OPEN REDUCTION INTERNAL FIXATION (ORIF) ANKLE FRACTURE;  Surgeon: Altamese Newell, MD;  Location: [facility];  Service: Orthopedics;  Laterality: Right;    Current Medications: Current Meds  Medication Sig  . acetaminophen (TYLENOL) 500 MG tablet Take 1-2 tablets (500-1,000 mg total) by mouth every 6 (six) hours as needed.  . Cholecalciferol (VITAMIN D3) 3000 UNITS TABS Take 1 tablet by mouth daily.  Marland Kitchen diltiazem (CARDIZEM CD) 120 MG 24 hr capsule Take 1 capsule (120 mg total) by mouth daily.  . ferrous sulfate 325 (65 FE) MG tablet Take 325 mg by mouth daily with breakfast.  . finasteride (PROSCAR) 5 MG tablet Take 5 mg by mouth daily.  Marland Kitchen  furosemide (LASIX) 20 MG tablet Take 20 mg by mouth daily.  . Mesalamine 800 MG TBEC Take 800 mg by mouth 3 (three) times daily.  . pantoprazole (PROTONIX) 40 MG tablet Take 40 mg by mouth daily.  . simvastatin (ZOCOR) 40 MG tablet Take 40 mg by mouth daily.  . sitaGLIPtin (JANUVIA) 50 MG tablet Take 50 mg by mouth daily.  . tamsulosin (FLOMAX) 0.4 MG CAPS capsule Take 0.4 mg by mouth daily. Reported on 08/17/2015  . traZODone (DESYREL) 100 MG tablet Take 100 mg by mouth at bedtime.     Allergies:    Keflet [cephalexin]   Social History: Social History   Socioeconomic History  . Marital status: Single    Spouse name: Not on file  . Number of children: Not on file  . Years of education: Not on file  . Highest education level:  Not on file  Occupational History  . Not on file  Social Needs  . Financial resource strain: Not on file  . Food insecurity    Worry: Not on file    Inability: Not on file  . Transportation needs    Medical: Not on file    Non-medical: Not on file  Tobacco Use  . Smoking status: Former Smoker    Years: 20.00    Types: Cigarettes    Quit date: 08/04/1992    Years since quitting: 26.6  . Smokeless tobacco: Never Used  Substance and Sexual Activity  . Alcohol use: Not Currently    Alcohol/week: 0.0 standard drinks  . Drug use: Not on file  . Sexual activity: Not on file  Lifestyle  . Physical activity    Days per week: Not on file    Minutes per session: Not on file  . Stress: Not on file  Relationships  . Social Herbalist on phone: Not on file    Gets together: Not on file    Attends religious service: Not on file    Active member of club or organization: Not on file    Attends meetings of clubs or organizations: Not on file    Relationship status: Not on file  Other Topics Concern  . Not on file  Social History Narrative  . Not on file     Family History: The patient's  family history includes Colon cancer in his father; Diabetes in his father and mother; Hyperlipidemia in his mother.  ROS:   All other ROS reviewed and negative. Pertinent positives noted in the HPI.     EKGs/Labs/Other Studies Reviewed:   The following studies were personally reviewed by me today:  Review of lab data from PCP on 02/06/2019 showed total cholesterol 130, HDL 62, LDL 59, triglycerides 47, A1c 6.7, serum creatinine 1.57, TSH 3.8  EKG:  EKG is ordered today.  The ekg ordered today demonstrates atrial fibrillation, heart rate 59, right bundle branch block with left anterior fascicular block, no acute ST-T changes, no evidence of prior infarction, and was personally reviewed by me.   Recent Labs: No results found for requested labs within last 8760 hours.   Recent Lipid  Panel No results found for: CHOL, TRIG, HDL, CHOLHDL, VLDL, LDLCALC, LDLDIRECT  Physical Exam:   VS:  BP (!) 172/62   Pulse (!) 59   Ht 6' (1.829 m)   Wt 202 lb 6.4 oz (91.8 kg)   BMI 27.45 kg/m    Wt Readings from Last 3 Encounters:  04/09/19 202 lb 6.4  oz (91.8 kg)  06/29/16 215 lb (97.5 kg)  08/17/15 220 lb (99.8 kg)    General: Well nourished, well developed, in no acute distress Heart: Atraumatic, normal size  Eyes: PEERLA, EOMI  Neck: Supple, no JVD Endocrine: No thryomegaly Cardiac: Irregular rhythm, no murmurs rubs or gallops Lungs: Clear to auscultation bilaterally, no wheezing, rhonchi or rales  Abd: Soft, nontender, no hepatomegaly  Ext: No edema, 1+ pitting edema to mid shin, chronic venous insufficiency changes noted Musculoskeletal: No deformities, BUE and BLE strength normal and equal Skin: Warm and dry, no rashes   Neuro: Alert and oriented to person, place, time, and situation, CNII-XII grossly intact, no focal deficits  Psych: Normal mood and affect   ASSESSMENT:   Glen Lee is a 85 y.o. Lee who presents for the following: 1. Permanent atrial fibrillation ([place])   2. RBBB   3. Essential hypertension   4. Mixed hyperlipidemia     PLAN:   1. Permanent atrial fibrillation (HCC) -Rate controlled today on diltiazem.  His chads vasc score is 4 and he merits anticoagulation.  Due to serum creatinine of 1.5 and age above 16 we will start with 2.5 mg twice daily.  We will also obtain an echocardiogram to get an updated one in the system.  He will let us know if he has any bleeding issues, and see [place] back in 3 months for follow-up of this.  2. RBBB -EKG with right bundle branch block and left anterior fascicular block.  Echocardiogram has been ordered  3. Essential hypertension -Blood pressure bit elevated today, but review of records from outside showed normal range.  We will watch this for now  4. Mixed hyperlipidemia -Most recent lipid profile within  normal range no change to his simvastatin 40 mg daily  Disposition: Return in about 3 months (around 07/10/2019).  Medication Adjustments/Labs and Tests Ordered: Current medicines are reviewed at length with the patient today.  Concerns regarding medicines are outlined above.  Orders Placed This Encounter  Procedures  . EKG 12-Lead  . ECHOCARDIOGRAM COMPLETE   Meds ordered this encounter  Medications  . apixaban (ELIQUIS) 2.5 MG TABS tablet    Sig: Take 1 tablet (2.5 mg total) by mouth 2 (two) times daily.    Dispense:  60 tablet    Refill:  1    Patient Instructions  Medication Instructions:  Your physician recommends that you continue on your current medications as directed. Please refer to the Current Medication list given to you today.  START Eliquis (Apixaban) 2.5 mg twice daily.  *If you need a refill on your cardiac medications before your next appointment, please call your pharmacy* .  Testing/Procedures: Your physician has requested that you have an echocardiogram. Echocardiography is a painless test that uses sound waves to create images of your heart. It provides your doctor with information about the size and shape of your heart and how well your heart's chambers and valves are working. This procedure takes approximately one hour. There are no restrictions for this procedure.   Follow-Up: At [facility], you and your health needs are our priority.  As part of our continuing mission to provide you with exceptional heart care, we have created designated Provider Care Teams.  These Care Teams include your primary Cardiologist (physician) and Advanced Practice Providers (APPs -  Physician Assistants and Nurse Practitioners) who all work together to provide you with the care you need, when you need it.  Your next appointment:   3  months  The format for your next appointment:   In Person  Provider:   You may see Evalina Field, MD or one of the following Advanced  Practice Providers on your designated Care Team:    Almyra Deforest, PA-C  Fabian Sharp, PA-C or   Roby Lofts, PA-C       Signed, Addison Naegeli. Audie Box, [place]  [address], [place], [place] [address] [phone]  04/09/2019 9:03 AM

## 2019-04-07 NOTE — Telephone Encounter (Signed)
Placed notation on appt notes

## 2019-04-09 ENCOUNTER — Encounter: Payer: Self-pay | Admitting: Cardiovascular Disease

## 2019-04-09 ENCOUNTER — Other Ambulatory Visit: Payer: Self-pay

## 2019-04-09 ENCOUNTER — Ambulatory Visit: Payer: Medicare PPO | Admitting: Cardiovascular Disease

## 2019-04-09 VITALS — BP 172/62 | HR 59 | Ht 72.0 in | Wt 202.4 lb

## 2019-04-09 DIAGNOSIS — I4510 Unspecified right bundle-branch block: Secondary | ICD-10-CM | POA: Diagnosis not present

## 2019-04-09 DIAGNOSIS — E782 Mixed hyperlipidemia: Secondary | ICD-10-CM | POA: Diagnosis not present

## 2019-04-09 DIAGNOSIS — I10 Essential (primary) hypertension: Secondary | ICD-10-CM | POA: Diagnosis not present

## 2019-04-09 DIAGNOSIS — I4821 Permanent atrial fibrillation: Secondary | ICD-10-CM

## 2019-04-09 MED ORDER — APIXABAN 2.5 MG PO TABS: 2.5000 mg | ORAL_TABLET | Freq: Two times a day (BID) | ORAL | 1 refills | Status: DC

## 2019-04-09 NOTE — Patient Instructions (Signed)
Medication Instructions:  Your physician recommends that you continue on your current medications as directed. Please refer to the Current Medication list given to you today.  START Eliquis (Apixaban) 2.5 mg twice daily.  *If you need a refill on your cardiac medications before your next appointment, please call your pharmacy* .  Testing/Procedures: Your physician has requested that you have an echocardiogram. Echocardiography is a painless test that uses sound waves to create images of your heart. It provides your doctor with information about the size and shape of your heart and how well your heart's chambers and valves are working. This procedure takes approximately one hour. There are no restrictions for this procedure.   Follow-Up: At [facility], you and your health needs are our priority.  As part of our continuing mission to provide you with exceptional heart care, we have created designated Provider Care Teams.  These Care Teams include your primary Cardiologist (physician) and Advanced Practice Providers (APPs -  Physician Assistants and Nurse Practitioners) who all work together to provide you with the care you need, when you need it.  Your next appointment:   3 months  The format for your next appointment:   In Person  Provider:   You may see Evalina Field, MD or one of the following Advanced Practice Providers on your designated Care Team:    Almyra Deforest, PA-C  Fabian Sharp, PA-C or   Roby Lofts, [place]

## 2019-04-10 ENCOUNTER — Other Ambulatory Visit: Payer: Self-pay | Admitting: Cardiovascular Disease

## 2019-04-10 MED ORDER — APIXABAN 2.5 MG PO TABS: 2.5000 mg | ORAL_TABLET | Freq: Two times a day (BID) | ORAL | 1 refills | Status: DC

## 2019-04-10 NOTE — Telephone Encounter (Signed)
°*  STAT* If patient is at the pharmacy, call can be transferred to refill team.   1. Which medications need to be refilled? (please list name of each medication and dose if known) Eliquis- refilll was sent to the wrong pharmacy 2. Which pharmacy/location (including street and city if local pharmacy) is medication to be sent to?* [facility] [phone]  3. Do they need a 30 day or 90 day supply? 180 and refills

## 2019-04-10 NOTE — Telephone Encounter (Signed)
Rx request sent to correct pharmacy.

## 2019-04-14 DIAGNOSIS — E1169 Type 2 diabetes mellitus with other specified complication: Secondary | ICD-10-CM | POA: Diagnosis not present

## 2019-04-23 ENCOUNTER — Other Ambulatory Visit: Payer: Self-pay

## 2019-04-23 ENCOUNTER — Ambulatory Visit (HOSPITAL_COMMUNITY): Payer: Medicare PPO | Attending: Cardiovascular Disease

## 2019-04-23 DIAGNOSIS — I4821 Permanent atrial fibrillation: Secondary | ICD-10-CM | POA: Diagnosis not present

## 2019-05-06 DIAGNOSIS — M1711 Unilateral primary osteoarthritis, right knee: Secondary | ICD-10-CM | POA: Diagnosis not present

## 2019-05-06 DIAGNOSIS — L309 Dermatitis, unspecified: Secondary | ICD-10-CM | POA: Diagnosis not present

## 2019-05-06 DIAGNOSIS — M25561 Pain in right knee: Secondary | ICD-10-CM | POA: Diagnosis not present

## 2019-05-06 DIAGNOSIS — L299 Pruritus, unspecified: Secondary | ICD-10-CM | POA: Diagnosis not present

## 2019-05-06 DIAGNOSIS — L219 Seborrheic dermatitis, unspecified: Secondary | ICD-10-CM | POA: Diagnosis not present

## 2019-05-14 DIAGNOSIS — N400 Enlarged prostate without lower urinary tract symptoms: Secondary | ICD-10-CM | POA: Diagnosis not present

## 2019-05-14 DIAGNOSIS — E1142 Type 2 diabetes mellitus with diabetic polyneuropathy: Secondary | ICD-10-CM | POA: Diagnosis not present

## 2019-05-14 DIAGNOSIS — K5100 Ulcerative (chronic) pancolitis without complications: Secondary | ICD-10-CM | POA: Diagnosis not present

## 2019-05-14 DIAGNOSIS — E1169 Type 2 diabetes mellitus with other specified complication: Secondary | ICD-10-CM | POA: Diagnosis not present

## 2019-05-14 DIAGNOSIS — Z23 Encounter for immunization: Secondary | ICD-10-CM | POA: Diagnosis not present

## 2019-05-14 DIAGNOSIS — E782 Mixed hyperlipidemia: Secondary | ICD-10-CM | POA: Diagnosis not present

## 2019-05-14 DIAGNOSIS — I10 Essential (primary) hypertension: Secondary | ICD-10-CM | POA: Diagnosis not present

## 2019-05-14 DIAGNOSIS — Z1159 Encounter for screening for other viral diseases: Secondary | ICD-10-CM | POA: Diagnosis not present

## 2019-05-14 DIAGNOSIS — K219 Gastro-esophageal reflux disease without esophagitis: Secondary | ICD-10-CM | POA: Diagnosis not present

## 2019-05-15 DIAGNOSIS — M1711 Unilateral primary osteoarthritis, right knee: Secondary | ICD-10-CM | POA: Diagnosis not present

## 2019-05-15 DIAGNOSIS — E1169 Type 2 diabetes mellitus with other specified complication: Secondary | ICD-10-CM | POA: Diagnosis not present

## 2019-05-30 DIAGNOSIS — M1711 Unilateral primary osteoarthritis, right knee: Secondary | ICD-10-CM | POA: Diagnosis not present

## 2019-07-01 ENCOUNTER — Other Ambulatory Visit: Payer: Self-pay

## 2019-07-01 ENCOUNTER — Encounter: Payer: Self-pay | Admitting: Podiatry

## 2019-07-01 ENCOUNTER — Ambulatory Visit (INDEPENDENT_AMBULATORY_CARE_PROVIDER_SITE_OTHER): Payer: Medicare Other | Admitting: Podiatry

## 2019-07-01 DIAGNOSIS — L84 Corns and callosities: Secondary | ICD-10-CM

## 2019-07-01 DIAGNOSIS — E1151 Type 2 diabetes mellitus with diabetic peripheral angiopathy without gangrene: Secondary | ICD-10-CM

## 2019-07-01 DIAGNOSIS — M2042 Other hammer toe(s) (acquired), left foot: Secondary | ICD-10-CM | POA: Diagnosis not present

## 2019-07-01 DIAGNOSIS — M6240 Contracture of muscle, unspecified site: Secondary | ICD-10-CM

## 2019-07-01 NOTE — Addendum Note (Signed)
Addended by: Cranford Mon R on: 07/01/2019 10:58 AM   Modules accepted: Orders

## 2019-07-01 NOTE — Progress Notes (Signed)
  Subjective:  Patient ID: Glen Lee, male    DOB: [birth year],  MRN: [id number]  Chief Complaint  Patient presents with  . Callouses    i have some corns on the end of my 2,3 toes left     86 y.o. male presents with the above complaint. History confirmed with patient. Hx as above.  Objective:  Physical Exam: warm, good capillary refill, no trophic changes or ulcerative lesions, normal DP and poorly palpable PT pulses and normal sensory exam. Left Foot: semi-rigid hammertoe left 2nd toe, flexible hammertoe left 3rd toe. Both with distal pre-ulcerative callus   Assessment:   1. Hammer toe of left foot   2. Callus   3. Diabetes mellitus type 2 with peripheral artery disease ([place])   4. Contracture of tendon sheath    Plan:  Patient was evaluated and treated and all questions answered.  Hammertoe left 2nd/3rd toes with distal pre-ulcerative callus; DM with PAD -Would benefit from flexor tenotomy procedure to alleviate contracture. Discussed with patient. Will plan for procedure in the coming weeks. -Pre-ulcerative calluses debrided x2  Procedure: Paring of Lesion Rationale: painful hyperkeratotic lesion Type of Debridement: manual, sharp debridement. Instrumentation: 312 blade Number of Lesions: 2   Return in about 2 weeks (around 07/15/2019) for flexor tenotomy procedure left 2nd/3rd toes.

## 2019-07-14 ENCOUNTER — Ambulatory Visit: Payer: Medicare Other | Admitting: Podiatry

## 2019-07-21 ENCOUNTER — Other Ambulatory Visit: Payer: Self-pay

## 2019-07-21 ENCOUNTER — Ambulatory Visit: Payer: Medicare Other | Admitting: Podiatry

## 2019-07-21 DIAGNOSIS — M2042 Other hammer toe(s) (acquired), left foot: Secondary | ICD-10-CM

## 2019-07-21 NOTE — Progress Notes (Signed)
  Subjective:  Patient ID: Glen Lee, male    DOB: 12/21/1933,  MRN: [id number]  Chief Complaint  Patient presents with  . tenotomy    F/u for tenotomy at Lt 2nd and 3rd toes pt. states," they're stil tender no change from last time." -6/10 pain when walking -w/ slight swellgin tx: toe crest  . Diabetes    FBS: dont check A1C: 7.1    86 y.o. male presents with the above complaint. History confirmed with patient.   Objective:  Physical Exam: warm, good capillary refill, no trophic changes or ulcerative lesions, normal DP and PT pulses and normal sensory exam. Left Foot: semi-reducible hammertoes 2nd/3rd toe left foot with distal pre-ulcers   Assessment:   1. Hammertoe of left foot     Plan:  Patient was evaluated and treated and all questions answered.  Hammertoe left 2nd/3rd toe with pre-ulcerative callus -Discussed proceeding with flexor tenotomy procedure. Patient would like to proceed. Consent form reviewed and signed. -Flexor tenotomy performed as below. -WBAT in surgical shoe. Shoe dispensed. -Advised to remove the dressing in 24 hours and apply a band-aid and triple abx ointment every day thereafter.  Procedure: Flexor Tenotomy Indication for Procedure: toe with semi-reducible hammertoe with distal tip ulceration. Flexor tenotomy indicated to alleviate contracture, reduce pressure, and enhance healing of the ulceration. Location: left, 2nd toe, 3rd toe Anesthesia: Lidocaine 1% plain; 1.5 mL and Marcaine 0.5% plain; 1.5 mL digital block Instrumentation: 18 g needle  Technique: The toe was anesthetized as above and prepped in the usual fashion. The toe was exsanquinated and a tourniquet was secured at the base of the toe. An 18g needle was then used to percutaneously release both flexor tendons at the plantar surface of the toe proximal to the PIPJ with noted release of the hammertoe deformity. The incision was then dressed with antibiotic ointment and band-aid.  Compression splint dressing applied. Patient tolerated the procedure well. Dressing: Dry, sterile, compression dressing. Disposition: Patient tolerated procedure well. Patient to return in 1 week for follow-up.  Return in about 2 weeks (around 08/04/2019) for Post-Op (No XRs); flexor tenotomy f/u .

## 2019-07-21 NOTE — Patient Instructions (Signed)
Remove dressing tomorrow Apply ointment and band-aid to the bottom of each toe Take tylenol as needed for pain.

## 2019-07-23 ENCOUNTER — Ambulatory Visit: Payer: Medicare PPO | Admitting: Cardiovascular Disease

## 2019-07-27 ENCOUNTER — Other Ambulatory Visit: Payer: Self-pay | Admitting: Legal Medicine

## 2019-08-04 ENCOUNTER — Ambulatory Visit (INDEPENDENT_AMBULATORY_CARE_PROVIDER_SITE_OTHER): Payer: Medicare Other | Admitting: Podiatry

## 2019-08-04 ENCOUNTER — Other Ambulatory Visit: Payer: Self-pay

## 2019-08-04 DIAGNOSIS — D689 Coagulation defect, unspecified: Secondary | ICD-10-CM | POA: Diagnosis not present

## 2019-08-04 DIAGNOSIS — B351 Tinea unguium: Secondary | ICD-10-CM

## 2019-08-04 DIAGNOSIS — E1151 Type 2 diabetes mellitus with diabetic peripheral angiopathy without gangrene: Secondary | ICD-10-CM | POA: Diagnosis not present

## 2019-08-04 NOTE — Progress Notes (Signed)
  Subjective:  Patient ID: Glen Lee, male    DOB: 1934/05/04,  MRN: [id number]  Chief Complaint  Patient presents with  . flexor tenotomy    F/U Lt 2nd-3rd flexor tenotomy Pt. states," they are stills ore at the tip where I have the calluses build up, otherwise they're doing good." tx: neosporin and bandaid every two days -pt deies redness -w/ slight swelling   . Diabetes    FBS: 132  x 1 day     DOS: 07/21/19 Procedure: Flexor tenotomy Left 2nd/3rd toes  86 y.o. male presents with the above complaint. History confirmed with patient.   Objective:  Physical Exam: tenderness at the surgical site and no edema noted. Incision: healing well. Toes rectus, callus facing out rather than down Onychomycosis of the toenails  Assessment:   1. Diabetes mellitus type 2 with peripheral artery disease ([place])   2. Onychomycosis   3. Coagulation defect [facility])     Plan:  Patient was evaluated and treated and all questions answered.  Post-operative State -No need for further abx ointment or bandaging  Onychomycosis, Coagulation defect -Nails debrided x10   Return in about 1 month (around 09/01/2019).

## 2019-08-12 ENCOUNTER — Other Ambulatory Visit: Payer: Self-pay

## 2019-08-12 ENCOUNTER — Other Ambulatory Visit: Payer: Medicare Other | Admitting: Orthotics

## 2019-08-18 ENCOUNTER — Other Ambulatory Visit: Payer: Self-pay

## 2019-08-18 ENCOUNTER — Ambulatory Visit: Payer: Medicare Other | Admitting: Podiatry

## 2019-08-18 ENCOUNTER — Encounter: Payer: Medicare Other | Admitting: Podiatry

## 2019-08-18 DIAGNOSIS — M2042 Other hammer toe(s) (acquired), left foot: Secondary | ICD-10-CM

## 2019-08-18 NOTE — Progress Notes (Signed)
  Subjective:  Patient ID: Glen Lee, male    DOB: 1934-02-24,  MRN: [id number]  Chief Complaint  Patient presents with  . Toe Pain    Pt stats Lt 2nd-3rd toes ares till curled and very sore when he walks -pt states," it feel slike im walking on the ends or tip of my toes." tx; cusions -pt denies redness/swellgin   . Diabetes    FBS: 154 A1C: >7    DOS: 07/21/19 Procedure: Flexor tenotomy Left 2nd/3rd toes  86 y.o. male presents with the above complaint. History confirmed with patient.   Objective:  Physical Exam: tenderness at the surgical site and no edema noted. Incision: healing well. Toes slight contracture, rigid, but callus facing out rather than down Assessment:   1. Hammertoe of left foot     Plan:  Patient was evaluated and treated and all questions answered.  Post-operative State -Still with some bony contracture -Dispensed silicone crest pad -Should issues persist would consider surgical intervention consisting of arthroplasty.  No follow-ups on file.

## 2019-09-01 ENCOUNTER — Encounter: Payer: Medicare Other | Admitting: Podiatry

## 2019-09-12 ENCOUNTER — Encounter: Payer: Self-pay | Admitting: Legal Medicine

## 2019-09-12 ENCOUNTER — Ambulatory Visit: Payer: Medicare Other | Admitting: Legal Medicine

## 2019-09-12 ENCOUNTER — Other Ambulatory Visit: Payer: Self-pay

## 2019-09-12 VITALS — BP 120/54 | HR 74 | Temp 98.0°F | Resp 16 | Ht 70.0 in | Wt 197.2 lb

## 2019-09-12 DIAGNOSIS — N400 Enlarged prostate without lower urinary tract symptoms: Secondary | ICD-10-CM

## 2019-09-12 DIAGNOSIS — E782 Mixed hyperlipidemia: Secondary | ICD-10-CM | POA: Diagnosis not present

## 2019-09-12 DIAGNOSIS — E1169 Type 2 diabetes mellitus with other specified complication: Secondary | ICD-10-CM | POA: Insufficient documentation

## 2019-09-12 DIAGNOSIS — N184 Chronic kidney disease, stage 4 (severe): Secondary | ICD-10-CM

## 2019-09-12 DIAGNOSIS — I10 Essential (primary) hypertension: Secondary | ICD-10-CM | POA: Diagnosis not present

## 2019-09-12 DIAGNOSIS — I4819 Other persistent atrial fibrillation: Secondary | ICD-10-CM

## 2019-09-12 DIAGNOSIS — E1121 Type 2 diabetes mellitus with diabetic nephropathy: Secondary | ICD-10-CM

## 2019-09-12 DIAGNOSIS — E1151 Type 2 diabetes mellitus with diabetic peripheral angiopathy without gangrene: Secondary | ICD-10-CM | POA: Insufficient documentation

## 2019-09-12 NOTE — Progress Notes (Signed)
Established Patient Office Visit  Subjective:  Patient ID: Glen Lee, male    DOB: [birth year]  Age: 86 y.o. MRN: [id number]  CC:  Chief Complaint  Patient presents with  . Diabetes  . Hypertension  . Hyperlipidemia    HPI Glen Lee presents for Chronic visit.  Patient present with type 2 diabetes.  Specifically, this is type 2, noninsulin requiring diabetes, complicated by hypertension and hypercholesterolemia.  Compliance with treatment has been good; patient take medicines as directed, maintains diet and exercise regimen, follows up as directed, and is keeping glucose diary.  Date of  diagnosis 2000.  Depression screen has been performed.Tobacco screen nonsmoker. Current medicines for diabetes tradjenta.  Patient is on nothing for renal protection and simvastatin for cholesterol control.  Patient performs foot exams daily and last ophthalmologic exam was one year  Patient presents for follow up of hypertension.  Patient tolerating furosemide well with side effects.  Patient was diagnosed with hypertension 2000 so has been treated for hypertension for 20 years.Patient is working on maintaining diet and exercise regimen and follows up as directed. Complication include ckd  Patient presents with hyperlipidemia.  Compliance with treatment has been good; patient takes medicines as directed, maintains low cholesterol diet, follows up as directed, and maintains exercise regimen.  Patient is using simvastatin without problems...  Past Medical History:  Diagnosis Date  . Anemia   . Arthritis   . Atrial fibrillation ([place])   . Benign prostate hyperplasia   . Diabetes mellitus without complication ([place])   . Edema    right leg  . Enlarged prostate   . Esophageal reflux   . Essential hypertension   . Hyperlipidemia   . Hypertension   . Mixed hyperlipidemia   . Tremor    right hand  . Type 2 diabetes mellitus with other specified complication ([place])   . Vitamin D deficiency      Past Surgical History:  Procedure Laterality Date  . JOINT REPLACEMENT Left    knee  . KNEE SURGERY    . ORIF ANKLE FRACTURE Right 06/30/2016   Procedure: OPEN REDUCTION INTERNAL FIXATION (ORIF) ANKLE FRACTURE;  Surgeon: Altamese Bangor, MD;  Location: [place];  Service: Orthopedics;  Laterality: Right;    Family History  Problem Relation Age of Onset  . Hyperlipidemia Mother   . Diabetes Mother   . Colon cancer Father   . Diabetes Father     Social History   Socioeconomic History  . Marital status: Single    Spouse name: Not on file  . Number of children: Not on file  . Years of education: Not on file  . Highest education level: Not on file  Occupational History  . Not on file  Tobacco Use  . Smoking status: Former Smoker    Years: 20.00    Types: Cigarettes    Quit date: 08/04/1992    Years since quitting: 27.1  . Smokeless tobacco: Never Used  Substance and Sexual Activity  . Alcohol use: Not Currently    Alcohol/week: 0.0 standard drinks  . Drug use: Never  . Sexual activity: Not on file  Other Topics Concern  . Not on file  Social History Narrative  . Not on file   Social Determinants of Health   Financial Resource Strain:   . Difficulty of Paying Living Expenses:   Food Insecurity:   . Worried About Charity fundraiser in the Last Year:   . [employer]  in the Last Year:   Transportation Needs:   . Film/video editor (Medical):   Marland Kitchen Lack of Transportation (Non-Medical):   Physical Activity:   . Days of Exercise per Week:   . Minutes of Exercise per Session:   Stress:   . Feeling of Stress :   Social Connections:   . Frequency of Communication with Friends and Family:   . Frequency of Social Gatherings with Friends and Family:   . Attends Religious Services:   . Active Member of Clubs or Organizations:   . Attends Archivist Meetings:   Marland Kitchen Marital Status:   Intimate Partner Violence:   . Fear of Current or Ex-Partner:   .  Emotionally Abused:   Marland Kitchen Physically Abused:   . Sexually Abused:     Outpatient Medications Prior to Visit  Medication Sig Dispense Refill  . acetaminophen (TYLENOL) 500 MG tablet Take 1-2 tablets (500-1,000 mg total) by mouth every 6 (six) hours as needed. 60 tablet 0  . diltiazem (CARDIZEM CD) 120 MG 24 hr capsule Take 1 capsule (120 mg total) by mouth daily. 30 capsule 0  . ferrous sulfate 325 (65 FE) MG tablet Take 325 mg by mouth daily with breakfast.    . finasteride (PROSCAR) 5 MG tablet TAKE 1 TABLET BY MOUTH EVERY DAY 90 tablet 2  . furosemide (LASIX) 20 MG tablet Take 20 mg by mouth daily.    . Mesalamine 800 MG TBEC Take 800 mg by mouth 3 (three) times daily.    . pantoprazole (PROTONIX) 40 MG tablet Take 40 mg by mouth daily.    . simvastatin (ZOCOR) 20 MG tablet Take 20 mg by mouth daily.     . tamsulosin (FLOMAX) 0.4 MG CAPS capsule Take 0.4 mg by mouth daily. Reported on 08/17/2015    . TRADJENTA 5 MG TABS tablet Take 5 mg by mouth daily.    Marland Kitchen apixaban (ELIQUIS) 2.5 MG TABS tablet Take 1 tablet (2.5 mg total) by mouth 2 (two) times daily. 60 tablet 1  . Cholecalciferol (VITAMIN D3) 3000 UNITS TABS Take 1 tablet by mouth daily.    . diclofenac Sodium (VOLTAREN) 1 % GEL     . traZODone (DESYREL) 100 MG tablet Take 100 mg by mouth at bedtime.     No facility-administered medications prior to visit.    Allergies  Allergen Reactions  . Torsemide   . Keflet [Cephalexin] Rash    ROS Review of Systems  Constitutional: Negative.   HENT: Negative.   Eyes: Negative.   Respiratory: Negative.   Cardiovascular: Negative.   Gastrointestinal: Negative.   Genitourinary: Negative.   Musculoskeletal: Negative.   Skin: Negative.   Neurological: Negative.   Psychiatric/Behavioral: Negative.       Objective:    Physical Exam  Constitutional: He is oriented to person, place, and time. He appears well-developed and well-nourished.  HENT:  Head: Normocephalic and atraumatic.    Right Ear: External ear normal.  Left Ear: External ear normal.  Nose: Nose normal.  Mouth/Throat: Oropharynx is clear and moist.  Eyes: Pupils are equal, round, and reactive to light. Conjunctivae and EOM are normal.  Cardiovascular: Normal rate, regular rhythm and normal heart sounds.  Pulmonary/Chest: Effort normal and breath sounds normal.  Abdominal: Soft. Bowel sounds are normal.  Musculoskeletal:        General: Normal range of motion.     Cervical back: Normal range of motion and neck supple.  Neurological: He is alert and oriented  to person, place, and time. He has normal reflexes.  Right hand tremor, bunions, edema feet  Skin: Skin is warm.  Psychiatric: He has a normal mood and affect.  Vitals reviewed.   BP (!) 120/54   Pulse 74   Temp 98 F (36.7 C)   Resp 16   Ht 5' 10"  (1.778 m)   Wt 197 lb 3.2 oz (89.4 kg)   SpO2 96%   BMI 28.30 kg/m  Wt Readings from Last 3 Encounters:  09/12/19 197 lb 3.2 oz (89.4 kg)  04/09/19 202 lb 6.4 oz (91.8 kg)  06/29/16 215 lb (97.5 kg)     Health Maintenance Due  Topic Date Due  . OPHTHALMOLOGY EXAM  Never done  . URINE MICROALBUMIN  Never done  . TETANUS/TDAP  Never done  . PNA vac Low Risk Adult (1 of 2 - PCV13) Never done    There are no preventive care reminders to display for this patient.  Lab Results  Component Value Date   TSH 0.713 08/05/2014   Lab Results  Component Value Date   WBC 5.8 09/12/2019   HGB 11.3 (L) 09/12/2019   HCT 33.9 (L) 09/12/2019   MCV 94 09/12/2019   PLT 154 09/12/2019   Lab Results  Component Value Date   NA 146 (H) 09/12/2019   K 4.4 09/12/2019   CO2 24 09/12/2019   GLUCOSE 133 (H) 09/12/2019   BUN 32 (H) 09/12/2019   CREATININE 1.44 (H) 09/12/2019   BILITOT 0.6 09/12/2019   ALKPHOS 121 (H) 09/12/2019   AST 15 09/12/2019   ALT 12 09/12/2019   PROT 6.8 09/12/2019   ALBUMIN 4.4 09/12/2019   CALCIUM 9.9 09/12/2019   ANIONGAP 8 07/03/2016   Lab Results  Component Value  Date   CHOL 138 09/12/2019   Lab Results  Component Value Date   HDL 66 09/12/2019   Lab Results  Component Value Date   LDLCALC 57 09/12/2019   Lab Results  Component Value Date   TRIG 76 09/12/2019   Lab Results  Component Value Date   CHOLHDL 2.1 09/12/2019   Lab Results  Component Value Date   HGBA1C 6.4 (H) 09/12/2019      Assessment & Plan:   Problem List Items Addressed This Visit      Cardiovascular and Mediastinum   Persistent atrial fibrillation ([place])    AN INDIVIDUAL CARE PLAN for atrial fibrillation was established and reinforced today.  The patient's status was assessed using clinical findings on exam, labs, and other diagnostic testing. Patient's success at meeting treatment goals based on disease specific evidence-bassed guidelines and found to be in good control. RECOMMENDATIONS include maintaining present medicines and treatment.      Essential hypertension    An individual hypertension care plan was established and reinforced today.  The patient's status was assessed using clinical findings on exam and labs or diagnostic tests. The patient's success at meeting treatment goals on disease specific evidence-based guidelines and found to be well controlled. SELF MANAGEMENT: The patient and I together assessed ways to personally work towards obtaining the recommended goals. RECOMMENDATIONS: avoid decongestants found in common cold remedies, decrease consumption of alcohol, perform routine monitoring of BP with home BP cuff, exercise, reduction of dietary salt, take medicines as prescribed, try not to miss doses and quit smoking.  Regular exercise and maintaining a healthy weight is needed.  Stress reduction may help. A CLINICAL SUMMARY including written plan identify barriers to care unique to individual  due to social or financial issues.  We attempt to mutually creat solutions for individual and family understanding.      Relevant Orders   CBC with  Differential (Completed)   Comprehensive metabolic panel (Completed)     Endocrine   Type 2 diabetes mellitus with other specified complication [facility])    An individual care plan was established for diabetes and reinforced today.  The patient's status was assessed using clinical findings on exam, labs and diagnostic testing. Patient success at meeting goals based on disease specific evidence-based guidelines and found to be good controlled. Medications were assessed and patient's understanding of the medical issues , including barriers were assessed. Recommend adherence to a diabetic diet, a graduated exercise program, HgbA1c level is checked quarterly, and urine microalbumin performed yearly .  Annual mono-filament sensation testing performed. Lower blood pressure and control hyperlipidemia is important. Get annual eye exams and annual flu shots and smoking cessation discussed.  Self management goals were discussed.      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  The patient's status was assessed using clinical findings on exam, labs and diagnostic testing. Patient success at meeting goals based on disease specific evidence-based guidelines and found to be good controlled. Medications were assessed and patient's understanding of the medical issues , including barriers were assessed. Recommend adherence to a diabetic diet, a graduated exercise program, HgbA1c level is checked quarterly, and urine microalbumin performed yearly .  Annual mono-filament sensation testing performed. Lower blood pressure and control hyperlipidemia is important. Get annual eye exams and annual flu shots and smoking cessation discussed.  Self management goals were discussed.      Relevant Orders   Hemoglobin A1c (Completed)     Genitourinary   CKD (chronic kidney disease) stage 4, GFR 15-29 ml/min  ([facility]) - Primary    AN INDIVIDUAL CARE PLAN for nephropathy was established and reinforced today.  The patient's status was assessed using clinical findings on exam, labs, and other diagnostic testing. Patient's success at meeting treatment goals based on disease specific evidence-bassed guidelines and found to be in good control. RECOMMENDATIONS include maintaining present medicines and treatment.      Relevant Orders   Comprehensive metabolic panel (Completed)   Benign prostate hyperplasia    AN INDIVIDUAL CARE PLAN for BPH  was established and reinforced today.  The patient's status was assessed using clinical findings on exam, labs, and other diagnostic testing. Patient's success at meeting treatment goals based on disease specific evidence-bassed guidelines and found to be in good control. RECOMMENDATIONS include maintaining present medicines and treatment.        Other   Mixed hyperlipidemia    AN INDIVIDUAL CARE PLAN for hyperlipidemia/ cholesterol was established and reinforced today.  The patient's status was assessed using clinical findings on exam, lab and other diagnostic tests. The patient's disease status was assessed based on evidence-based guidelines and found to be well controlled. MEDICATIONS were reviewed. SELF MANAGEMENT GOALS have been discussed and patient's success at attaining the goal of low cholesterol was assessed. RECOMMENDATION given include regular exercise 3 days a week and low cholesterol/low fat diet. CLINICAL SUMMARY including written plan to identify barriers unique to the patient due to social or economic  reasons was discussed.      Relevant Orders   Lipid Panel (Completed)      No orders of the defined  types were placed in this encounter.   Follow-up: Return in about 4 months (around 01/12/2020) for fasting.    Reinaldo Meeker, MD

## 2019-09-13 LAB — CBC WITH DIFFERENTIAL/PLATELET
Basophils Absolute: 0.1 10*3/uL (ref 0.0–0.2)
Basos: 1 %
EOS (ABSOLUTE): 0.1 10*3/uL (ref 0.0–0.4)
Eos: 2 %
Hematocrit: 33.9 % — ABNORMAL LOW (ref 37.5–51.0)
Hemoglobin: 11.3 g/dL — ABNORMAL LOW (ref 13.0–17.7)
Immature Grans (Abs): 0 10*3/uL (ref 0.0–0.1)
Immature Granulocytes: 1 %
Lymphocytes Absolute: 0.9 10*3/uL (ref 0.7–3.1)
Lymphs: 15 %
MCH: 31.2 pg (ref 26.6–33.0)
MCHC: 33.3 g/dL (ref 31.5–35.7)
MCV: 94 fL (ref 79–97)
Monocytes Absolute: 0.5 10*3/uL (ref 0.1–0.9)
Monocytes: 9 %
Neutrophils Absolute: 4.2 10*3/uL (ref 1.4–7.0)
Neutrophils: 72 %
Platelets: 154 10*3/uL (ref 150–450)
RBC: 3.62 x10E6/uL — ABNORMAL LOW (ref 4.14–5.80)
RDW: 13.5 % (ref 11.6–15.4)
WBC: 5.8 10*3/uL (ref 3.4–10.8)

## 2019-09-13 LAB — COMPREHENSIVE METABOLIC PANEL
ALT: 12 IU/L (ref 0–44)
AST: 15 IU/L (ref 0–40)
Albumin/Globulin Ratio: 1.8 (ref 1.2–2.2)
Albumin: 4.4 g/dL (ref 3.6–4.6)
Alkaline Phosphatase: 121 IU/L — ABNORMAL HIGH (ref 39–117)
BUN/Creatinine Ratio: 22 (ref 10–24)
BUN: 32 mg/dL — ABNORMAL HIGH (ref 8–27)
Bilirubin Total: 0.6 mg/dL (ref 0.0–1.2)
CO2: 24 mmol/L (ref 20–29)
Calcium: 9.9 mg/dL (ref 8.6–10.2)
Chloride: 108 mmol/L — ABNORMAL HIGH (ref 96–106)
Creatinine, Ser: 1.44 mg/dL — ABNORMAL HIGH (ref 0.76–1.27)
GFR calc Af Amer: 52 mL/min/{1.73_m2} — ABNORMAL LOW (ref >59)
GFR calc non Af Amer: 45 mL/min/{1.73_m2} — ABNORMAL LOW (ref >59)
Globulin, Total: 2.4 g/dL (ref 1.5–4.5)
Glucose: 133 mg/dL — ABNORMAL HIGH (ref 65–99)
Potassium: 4.4 mmol/L (ref 3.5–5.2)
Sodium: 146 mmol/L — ABNORMAL HIGH (ref 134–144)
Total Protein: 6.8 g/dL (ref 6.0–8.5)

## 2019-09-13 LAB — HEMOGLOBIN A1C
Est. average glucose Bld gHb Est-mCnc: 137 mg/dL
Hgb A1c MFr Bld: 6.4 % — ABNORMAL HIGH (ref 4.8–5.6)

## 2019-09-13 LAB — LIPID PANEL
Chol/HDL Ratio: 2.1 ratio (ref 0.0–5.0)
Cholesterol, Total: 138 mg/dL (ref 100–199)
HDL: 66 mg/dL (ref >39)
LDL Chol Calc (NIH): 57 mg/dL (ref 0–99)
Triglycerides: 76 mg/dL (ref 0–149)
VLDL Cholesterol Cal: 15 mg/dL (ref 5–40)

## 2019-09-13 LAB — CARDIOVASCULAR RISK ASSESSMENT

## 2019-09-13 NOTE — Assessment & Plan Note (Signed)
An individual hypertension care plan was established and reinforced today.  The patient's status was assessed using clinical findings on exam and labs or diagnostic tests. The patient's success at meeting treatment goals on disease specific evidence-based guidelines and found to be well controlled. SELF MANAGEMENT: The patient and I together assessed ways to personally work towards obtaining the recommended goals. RECOMMENDATIONS: avoid decongestants found in common cold remedies, decrease consumption of alcohol, perform routine monitoring of BP with home BP cuff, exercise, reduction of dietary salt, take medicines as prescribed, try not to miss doses and quit smoking.  Regular exercise and maintaining a healthy weight is needed.  Stress reduction may help. A CLINICAL SUMMARY including written plan identify barriers to care unique to individual due to social or financial issues.  We attempt to mutually creat solutions for individual and family understanding.

## 2019-09-13 NOTE — Assessment & Plan Note (Signed)
AN INDIVIDUAL CARE PLAN for atrial fibrillation was established and reinforced today.  The patient's status was assessed using clinical findings on exam, labs, and other diagnostic testing. Patient's success at meeting treatment goals based on disease specific evidence-bassed guidelines and found to be in good control. RECOMMENDATIONS include maintaining present medicines and treatment.

## 2019-09-13 NOTE — Assessment & Plan Note (Signed)
An individual care plan was established for diabetes and reinforced today.  The patient's status was assessed using clinical findings on exam, labs and diagnostic testing. Patient success at meeting goals based on disease specific evidence-based guidelines and found to be good controlled. Medications were assessed and patient's understanding of the medical issues , including barriers were assessed. Recommend adherence to a diabetic diet, a graduated exercise program, HgbA1c level is checked quarterly, and urine microalbumin performed yearly .  Annual mono-filament sensation testing performed. Lower blood pressure and control hyperlipidemia is important. Get annual eye exams and annual flu shots and smoking cessation discussed.  Self management goals were discussed.

## 2019-09-13 NOTE — Assessment & Plan Note (Signed)
AN INDIVIDUAL CARE PLAN for BPH  was established and reinforced today.  The patient's status was assessed using clinical findings on exam, labs, and other diagnostic testing. Patient's success at meeting treatment goals based on disease specific evidence-bassed guidelines and found to be in good control. RECOMMENDATIONS include maintaining present medicines and treatment.

## 2019-09-13 NOTE — Assessment & Plan Note (Signed)
An individual care plan for diabetic nephropathy was established and reinforced today.  The patient's status was assessed using clinical findings on exam, labs and diagnostic testing. Patient success at meeting goals based on disease specific evidence-based guidelines and found to be good controlled. Medications were assessed and patient's understanding of the medical issues , including barriers were assessed. Recommend adherence to a diabetic diet, a graduated exercise program, HgbA1c level is checked quarterly, and urine microalbumin performed yearly .  Annual mono-filament sensation testing performed. Lower blood pressure and control hyperlipidemia is important. Get annual eye exams and annual flu shots and smoking cessation discussed.  Self management goals were discussed.

## 2019-09-13 NOTE — Assessment & Plan Note (Signed)
AN INDIVIDUAL CARE PLAN for nephropathy was established and reinforced today.  The patient's status was assessed using clinical findings on exam, labs, and other diagnostic testing. Patient's success at meeting treatment goals based on disease specific evidence-bassed guidelines and found to be in good control. RECOMMENDATIONS include maintaining present medicines and treatment.

## 2019-09-13 NOTE — Progress Notes (Signed)
Hemoglobin and hematocrit are rising 11.3/33.9 good lp

## 2019-09-13 NOTE — Assessment & Plan Note (Signed)
AN INDIVIDUAL CARE PLAN for hyperlipidemia/ cholesterol was established and reinforced today.  The patient's status was assessed using clinical findings on exam, lab and other diagnostic tests. The patient's disease status was assessed based on evidence-based guidelines and found to be well controlled. MEDICATIONS were reviewed. SELF MANAGEMENT GOALS have been discussed and patient's success at attaining the goal of low cholesterol was assessed. RECOMMENDATION given include regular exercise 3 days a week and low cholesterol/low fat diet. CLINICAL SUMMARY including written plan to identify barriers unique to the patient due to social or economic  reasons was discussed.

## 2019-09-18 ENCOUNTER — Other Ambulatory Visit: Payer: Self-pay | Admitting: Legal Medicine

## 2019-09-22 ENCOUNTER — Ambulatory Visit: Payer: Medicare Other | Admitting: Podiatry

## 2019-09-22 ENCOUNTER — Ambulatory Visit: Payer: Medicare Other | Admitting: Orthotics

## 2019-09-29 ENCOUNTER — Other Ambulatory Visit: Payer: Self-pay | Admitting: Family Medicine

## 2019-10-07 ENCOUNTER — Ambulatory Visit: Payer: Medicare Other | Admitting: Podiatry

## 2019-10-07 ENCOUNTER — Other Ambulatory Visit: Payer: Self-pay

## 2019-10-07 DIAGNOSIS — M2042 Other hammer toe(s) (acquired), left foot: Secondary | ICD-10-CM | POA: Diagnosis not present

## 2019-10-07 DIAGNOSIS — L84 Corns and callosities: Secondary | ICD-10-CM | POA: Diagnosis not present

## 2019-10-07 DIAGNOSIS — E1151 Type 2 diabetes mellitus with diabetic peripheral angiopathy without gangrene: Secondary | ICD-10-CM | POA: Diagnosis not present

## 2019-10-07 NOTE — Progress Notes (Signed)
Patient presented for DM shoe pickup. Was not seen by provider. Patient presents for diabetic shoe pick up, shoes are tried on for good fit.  Patient received 1 Pair and 3 pairs custom molded diabetic inserts.  Verbal and written break in and wear instructions given.  Patient will follow up for scheduled routine care.

## 2019-10-07 NOTE — Progress Notes (Signed)
Charges are entered.

## 2019-10-09 ENCOUNTER — Telehealth: Payer: Self-pay | Admitting: Podiatry

## 2019-10-09 NOTE — Telephone Encounter (Signed)
Pt called stating the diabetic shoes he received say tie less and they only tie. I tired to explain how the velcro works but pt did not see it. He is now scheduled to see Rick on 5.12. I told pt not to wear the shoes because if worn we are not able to return them.

## 2019-10-13 ENCOUNTER — Ambulatory Visit: Payer: Medicare Other | Admitting: Legal Medicine

## 2019-10-13 ENCOUNTER — Other Ambulatory Visit: Payer: Self-pay

## 2019-10-13 ENCOUNTER — Encounter: Payer: Self-pay | Admitting: Legal Medicine

## 2019-10-13 VITALS — BP 140/88 | HR 79 | Temp 97.7°F | Resp 16 | Ht 70.0 in | Wt 196.0 lb

## 2019-10-13 DIAGNOSIS — S51002A Unspecified open wound of left elbow, initial encounter: Secondary | ICD-10-CM

## 2019-10-13 DIAGNOSIS — Z23 Encounter for immunization: Secondary | ICD-10-CM | POA: Diagnosis not present

## 2019-10-13 NOTE — Assessment & Plan Note (Signed)
Skin avulsion left elbow from fall.  The loose skin was debrided and cleaned.  The area  Is bleeding some.  Redressed and follow up 2 days. Tetanus given

## 2019-10-13 NOTE — Progress Notes (Signed)
Acute Office Visit  Subjective:    Patient ID: Glen Lee, male    DOB: 12/25/1933, 86 y.o.   MRN: [id number]  Chief Complaint  Patient presents with  . skin tear  . left shoulder pain    HPI Patient is in today for open wound left elbow.  He fell 2 days ago and hurt his shoulder and has large area of skin avulsion on left elbow.  The paramedics dressed.  He returns to the physician.  The area debrided. And redressed.  ? Tetanus in past. He did not go to ER.  Past Medical History:  Diagnosis Date  . Anemia   . Arthritis   . Atrial fibrillation ([place])   . Benign prostate hyperplasia   . Diabetes mellitus without complication ([place])   . Edema    right leg  . Enlarged prostate   . Esophageal reflux   . Essential hypertension   . Hyperlipidemia   . Hypertension   . Mixed hyperlipidemia   . Tremor    right hand  . Type 2 diabetes mellitus with other specified complication ([place])   . Vitamin D deficiency     Past Surgical History:  Procedure Laterality Date  . JOINT REPLACEMENT Left    knee  . KNEE SURGERY    . ORIF ANKLE FRACTURE Right 06/30/2016   Procedure: OPEN REDUCTION INTERNAL FIXATION (ORIF) ANKLE FRACTURE;  Surgeon: Altamese Kiel, MD;  Location: [facility];  Service: Orthopedics;  Laterality: Right;    Family History  Problem Relation Age of Onset  . Hyperlipidemia Mother   . Diabetes Mother   . Colon cancer Father   . Diabetes Father     Social History   Socioeconomic History  . Marital status: Single    Spouse name: Not on file  . Number of children: Not on file  . Years of education: Not on file  . Highest education level: Not on file  Occupational History  . Not on file  Tobacco Use  . Smoking status: Former Smoker    Years: 20.00    Types: Cigarettes    Quit date: 08/04/1992    Years since quitting: 27.2  . Smokeless tobacco: Never Used  Substance and Sexual Activity  . Alcohol use: Not Currently    Alcohol/week: 0.0 standard drinks  .  Drug use: Never  . Sexual activity: Not on file  Other Topics Concern  . Not on file  Social History Narrative  . Not on file   Social Determinants of Health   Financial Resource Strain:   . Difficulty of Paying Living Expenses:   Food Insecurity:   . Worried About Charity fundraiser in the Last Year:   . Arboriculturist in the Last Year:   Transportation Needs:   . Film/video editor (Medical):   Marland Kitchen Lack of Transportation (Non-Medical):   Physical Activity:   . Days of Exercise per Week:   . Minutes of Exercise per Session:   Stress:   . Feeling of Stress :   Social Connections:   . Frequency of Communication with Friends and Family:   . Frequency of Social Gatherings with Friends and Family:   . Attends Religious Services:   . Active Member of Clubs or Organizations:   . Attends Archivist Meetings:   Marland Kitchen Marital Status:   Intimate Partner Violence:   . Fear of Current or Ex-Partner:   . Emotionally Abused:   Marland Kitchen Physically Abused:   .  Sexually Abused:     Outpatient Medications Prior to Visit  Medication Sig Dispense Refill  . acetaminophen (TYLENOL) 500 MG tablet Take 1-2 tablets (500-1,000 mg total) by mouth every 6 (six) hours as needed. 60 tablet 0  . diltiazem (CARDIZEM CD) 120 MG 24 hr capsule Take 1 capsule (120 mg total) by mouth daily. 30 capsule 0  . ferrous sulfate 325 (65 FE) MG tablet Take 325 mg by mouth daily with breakfast.    . finasteride (PROSCAR) 5 MG tablet TAKE 1 TABLET BY MOUTH EVERY DAY 90 tablet 2  . furosemide (LASIX) 20 MG tablet Take 20 mg by mouth daily.    Marland Kitchen ketoconazole (NIZORAL) 2 % shampoo USE AS DIRECTED, ROTATE BETWEEN OVER THE COUNTER SHAMPOOS    . Mesalamine 800 MG TBEC Take 800 mg by mouth 3 (three) times daily.    . pantoprazole (PROTONIX) 20 MG tablet TAKE 1 TABLET BY MOUTH EVERY DAY AS DIRECTED 90 tablet 3  . pantoprazole (PROTONIX) 40 MG tablet Take 40 mg by mouth daily.    . simvastatin (ZOCOR) 20 MG tablet TAKE 1  TABLET BY MOUTH EVERY DAY 90 tablet 2  . tamsulosin (FLOMAX) 0.4 MG CAPS capsule Take 0.4 mg by mouth daily. Reported on 08/17/2015    . TRADJENTA 5 MG TABS tablet Take 5 mg by mouth daily.     No facility-administered medications prior to visit.    Allergies  Allergen Reactions  . Torsemide   . Keflet [Cephalexin] Rash    Review of Systems  Constitutional: Negative.   HENT: Negative.   Eyes: Negative.   Respiratory: Negative.   Musculoskeletal: Positive for arthralgias.       Shoulder pain on left to see orthopedics tomorrow  Skin: Positive for wound.       6 by 7 cm skin avulsion left elbow.  debrided  Neurological: Negative.        Objective:    Physical Exam Vitals reviewed.  Constitutional:      Appearance: Normal appearance.  HENT:     Head: Normocephalic and atraumatic.     Left Ear: Tympanic membrane normal.     Nose: Nose normal.     Mouth/Throat:     Mouth: Mucous membranes are dry.  Cardiovascular:     Rate and Rhythm: Normal rate and regular rhythm.     Heart sounds: Normal heart sounds.  Musculoskeletal:     Left shoulder: Swelling and tenderness present.       Arms:     Cervical back: Normal range of motion and neck supple.     Comments: Minimal passive motion on left shoulder, no definite deformity but he is to see orthopedics tomorrow.  Skin:         Comments: Open wound left elbow.  Debrided sharply wound is 6 by 7cm skin avulsion on elbow, full ROm elbow.     Neurological:     Mental Status: He is alert.     BP 140/88   Pulse 79   Temp 97.7 F (36.5 C)   Resp 16   Ht 5' 10"  (1.778 m)   Wt 196 lb (88.9 kg)   SpO2 90%   BMI 28.12 kg/m  Wt Readings from Last 3 Encounters:  10/13/19 196 lb (88.9 kg)  09/12/19 197 lb 3.2 oz (89.4 kg)  04/09/19 202 lb 6.4 oz (91.8 kg)    Health Maintenance Due  Topic Date Due  . OPHTHALMOLOGY EXAM  Never done  .  URINE MICROALBUMIN  Never done  . COVID-19 Vaccine (1) Never done  . PNA vac Low Risk  Adult (1 of 2 - PCV13) Never done    There are no preventive care reminders to display for this patient.   Lab Results  Component Value Date   TSH 0.713 08/05/2014   Lab Results  Component Value Date   WBC 5.8 09/12/2019   HGB 11.3 (L) 09/12/2019   HCT 33.9 (L) 09/12/2019   MCV 94 09/12/2019   PLT 154 09/12/2019   Lab Results  Component Value Date   NA 146 (H) 09/12/2019   K 4.4 09/12/2019   CO2 24 09/12/2019   GLUCOSE 133 (H) 09/12/2019   BUN 32 (H) 09/12/2019   CREATININE 1.44 (H) 09/12/2019   BILITOT 0.6 09/12/2019   ALKPHOS 121 (H) 09/12/2019   AST 15 09/12/2019   ALT 12 09/12/2019   PROT 6.8 09/12/2019   ALBUMIN 4.4 09/12/2019   CALCIUM 9.9 09/12/2019   ANIONGAP 8 07/03/2016   Lab Results  Component Value Date   CHOL 138 09/12/2019   Lab Results  Component Value Date   HDL 66 09/12/2019   Lab Results  Component Value Date   LDLCALC 57 09/12/2019   Lab Results  Component Value Date   TRIG 76 09/12/2019   Lab Results  Component Value Date   CHOLHDL 2.1 09/12/2019   Lab Results  Component Value Date   HGBA1C 6.4 (H) 09/12/2019       Assessment & Plan:   Problem List Items Addressed This Visit      Other   Open wound of left elbow - Primary    Skin avulsion left elbow from fall.  The loose skin was debrided and cleaned.  The area  Is bleeding some.  Redressed and follow up 2 days. Tetanus given      Relevant Orders   Tdap vaccine greater than or equal to 7yo IM (Completed)       No orders of the defined types were placed in this encounter.    Reinaldo Meeker, MD

## 2019-10-14 ENCOUNTER — Telehealth: Payer: Self-pay

## 2019-10-14 ENCOUNTER — Other Ambulatory Visit: Payer: Self-pay | Admitting: Legal Medicine

## 2019-10-14 NOTE — Telephone Encounter (Signed)
No antibiotics needed- no infection lp

## 2019-10-16 ENCOUNTER — Encounter: Payer: Self-pay | Admitting: Legal Medicine

## 2019-10-16 ENCOUNTER — Ambulatory Visit (INDEPENDENT_AMBULATORY_CARE_PROVIDER_SITE_OTHER): Payer: Medicare Other | Admitting: Legal Medicine

## 2019-10-16 ENCOUNTER — Other Ambulatory Visit: Payer: Self-pay

## 2019-10-16 VITALS — BP 150/78 | HR 71 | Temp 97.2°F | Resp 18 | Ht 70.0 in | Wt 197.0 lb

## 2019-10-16 DIAGNOSIS — Z6828 Body mass index (BMI) 28.0-28.9, adult: Secondary | ICD-10-CM | POA: Diagnosis not present

## 2019-10-16 DIAGNOSIS — S51002D Unspecified open wound of left elbow, subsequent encounter: Secondary | ICD-10-CM

## 2019-10-16 NOTE — Progress Notes (Signed)
Established Patient Office Visit  Subjective:  Patient ID: Glen Lee, male    DOB: Jul 24, 1933  Age: 86 y.o. MRN: [id number]  CC:  Chief Complaint  Patient presents with  . open wound of left elbow    HPI ONIX JUMPER presents for open wound left elbow.  Open wound from fall.  No debridement today.  He has full ROM on elbow and there is no infection.  Past Medical History:  Diagnosis Date  . Anemia   . Arthritis   . Atrial fibrillation ([place])   . Benign prostate hyperplasia   . Diabetes mellitus without complication ([place])   . Edema    right leg  . Enlarged prostate   . Esophageal reflux   . Essential hypertension   . Hyperlipidemia   . Hypertension   . Mixed hyperlipidemia   . Tremor    right hand  . Type 2 diabetes mellitus with other specified complication ([place])   . Vitamin D deficiency     Past Surgical History:  Procedure Laterality Date  . JOINT REPLACEMENT Left    knee  . KNEE SURGERY    . ORIF ANKLE FRACTURE Right 06/30/2016   Procedure: OPEN REDUCTION INTERNAL FIXATION (ORIF) ANKLE FRACTURE;  Surgeon: Altamese Congress, MD;  Location: [facility];  Service: Orthopedics;  Laterality: Right;    Family History  Problem Relation Age of Onset  . Hyperlipidemia Mother   . Diabetes Mother   . Colon cancer Father   . Diabetes Father     Social History   Socioeconomic History  . Marital status: Single    Spouse name: Not on file  . Number of children: Not on file  . Years of education: Not on file  . Highest education level: Not on file  Occupational History  . Not on file  Tobacco Use  . Smoking status: Former Smoker    Years: 20.00    Types: Cigarettes    Quit date: 08/04/1992    Years since quitting: 27.2  . Smokeless tobacco: Never Used  Substance and Sexual Activity  . Alcohol use: Not Currently    Alcohol/week: 0.0 standard drinks  . Drug use: Never  . Sexual activity: Not on file  Other Topics Concern  . Not on file  Social History  Narrative  . Not on file   Social Determinants of Health   Financial Resource Strain:   . Difficulty of Paying Living Expenses:   Food Insecurity:   . Worried About Charity fundraiser in the Last Year:   . Arboriculturist in the Last Year:   Transportation Needs:   . Film/video editor (Medical):   Marland Kitchen Lack of Transportation (Non-Medical):   Physical Activity:   . Days of Exercise per Week:   . Minutes of Exercise per Session:   Stress:   . Feeling of Stress :   Social Connections:   . Frequency of Communication with Friends and Family:   . Frequency of Social Gatherings with Friends and Family:   . Attends Religious Services:   . Active Member of Clubs or Organizations:   . Attends Archivist Meetings:   Marland Kitchen Marital Status:   Intimate Partner Violence:   . Fear of Current or Ex-Partner:   . Emotionally Abused:   Marland Kitchen Physically Abused:   . Sexually Abused:     Outpatient Medications Prior to Visit  Medication Sig Dispense Refill  . acetaminophen (TYLENOL) 500 MG tablet Take  1-2 tablets (500-1,000 mg total) by mouth every 6 (six) hours as needed. 60 tablet 0  . diltiazem (CARDIZEM CD) 120 MG 24 hr capsule Take 1 capsule (120 mg total) by mouth daily. 30 capsule 0  . ferrous sulfate 325 (65 FE) MG tablet Take 325 mg by mouth daily with breakfast.    . finasteride (PROSCAR) 5 MG tablet TAKE 1 TABLET BY MOUTH EVERY DAY 90 tablet 2  . furosemide (LASIX) 20 MG tablet Take 20 mg by mouth daily.    Marland Kitchen ketoconazole (NIZORAL) 2 % shampoo USE AS DIRECTED, ROTATE BETWEEN OVER THE COUNTER SHAMPOOS    . Mesalamine 800 MG TBEC Take 800 mg by mouth 3 (three) times daily.    . pantoprazole (PROTONIX) 20 MG tablet TAKE 1 TABLET BY MOUTH EVERY DAY AS DIRECTED 90 tablet 3  . pantoprazole (PROTONIX) 40 MG tablet Take 40 mg by mouth daily.    . simvastatin (ZOCOR) 20 MG tablet TAKE 1 TABLET BY MOUTH EVERY DAY 90 tablet 2  . tamsulosin (FLOMAX) 0.4 MG CAPS capsule Take 0.4 mg by mouth  daily. Reported on 08/17/2015    . TRADJENTA 5 MG TABS tablet Take 5 mg by mouth daily.     No facility-administered medications prior to visit.    Allergies  Allergen Reactions  . Torsemide   . Keflet [Cephalexin] Rash    ROS Review of Systems  Constitutional: Negative.   HENT: Negative.   Respiratory: Negative.   Cardiovascular: Negative.   Skin: Positive for wound.      Objective:    Physical Exam  Constitutional: He appears well-developed and well-nourished.  HENT:  Head: Normocephalic and atraumatic.  Eyes: Pupils are equal, round, and reactive to light. Conjunctivae and EOM are normal.  Cardiovascular: Normal rate, regular rhythm, normal heart sounds and intact distal pulses.  Pulmonary/Chest: Effort normal and breath sounds normal.  Skin:  Open wound with no infection left elbow. No debridement today.  Area redressed.  Vitals reviewed.   BP (!) 150/78   Pulse 71   Temp (!) 97.2 F (36.2 C)   Resp 18   Ht 5' 10"  (1.778 m)   Wt 197 lb (89.4 kg)   SpO2 98%   BMI 28.27 kg/m  Wt Readings from Last 3 Encounters:  10/16/19 197 lb (89.4 kg)  10/13/19 196 lb (88.9 kg)  09/12/19 197 lb 3.2 oz (89.4 kg)     Health Maintenance Due  Topic Date Due  . OPHTHALMOLOGY EXAM  Never done  . URINE MICROALBUMIN  Never done  . COVID-19 Vaccine (1) Never done  . PNA vac Low Risk Adult (1 of 2 - PCV13) Never done    There are no preventive care reminders to display for this patient.  Lab Results  Component Value Date   TSH 0.713 08/05/2014   Lab Results  Component Value Date   WBC 5.8 09/12/2019   HGB 11.3 (L) 09/12/2019   HCT 33.9 (L) 09/12/2019   MCV 94 09/12/2019   PLT 154 09/12/2019   Lab Results  Component Value Date   NA 146 (H) 09/12/2019   K 4.4 09/12/2019   CO2 24 09/12/2019   GLUCOSE 133 (H) 09/12/2019   BUN 32 (H) 09/12/2019   CREATININE 1.44 (H) 09/12/2019   BILITOT 0.6 09/12/2019   ALKPHOS 121 (H) 09/12/2019   AST 15 09/12/2019   ALT 12  09/12/2019   PROT 6.8 09/12/2019   ALBUMIN 4.4 09/12/2019   CALCIUM 9.9 09/12/2019  ANIONGAP 8 07/03/2016   Lab Results  Component Value Date   CHOL 138 09/12/2019   Lab Results  Component Value Date   HDL 66 09/12/2019   Lab Results  Component Value Date   LDLCALC 57 09/12/2019   Lab Results  Component Value Date   TRIG 76 09/12/2019   Lab Results  Component Value Date   CHOLHDL 2.1 09/12/2019   Lab Results  Component Value Date   HGBA1C 6.4 (H) 09/12/2019      Assessment & Plan:   Problem List Items Addressed This Visit      Other   Open wound of left elbow - Primary    The wound is healing well and he has no infection, redress and follow up one week.      BMI 28.0-28.9,adult    Overweight but stable, age > 89 , no need for weight loss         No orders of the defined types were placed in this encounter.   Follow-up: Return in about 1 week (around 10/23/2019) for elbow wound.    Reinaldo Meeker, MD

## 2019-10-16 NOTE — Assessment & Plan Note (Signed)
Overweight but stable, age > 80 , no need for weight loss

## 2019-10-16 NOTE — Assessment & Plan Note (Signed)
The wound is healing well and he has no infection, redress and follow up one week.

## 2019-10-22 ENCOUNTER — Other Ambulatory Visit: Payer: Self-pay

## 2019-10-22 ENCOUNTER — Ambulatory Visit: Payer: Medicare Other | Admitting: Legal Medicine

## 2019-10-22 ENCOUNTER — Encounter: Payer: Self-pay | Admitting: Legal Medicine

## 2019-10-22 ENCOUNTER — Ambulatory Visit: Payer: Medicare Other | Admitting: Orthotics

## 2019-10-22 VITALS — BP 132/60 | HR 78 | Temp 96.9°F | Resp 17 | Ht 70.0 in | Wt 193.0 lb

## 2019-10-22 DIAGNOSIS — M2042 Other hammer toe(s) (acquired), left foot: Secondary | ICD-10-CM

## 2019-10-22 DIAGNOSIS — S51002D Unspecified open wound of left elbow, subsequent encounter: Secondary | ICD-10-CM | POA: Diagnosis not present

## 2019-10-22 NOTE — Assessment & Plan Note (Signed)
The open wound is clean and slowly granulating in well.  Keep nonstick dressing and follow up one week.

## 2019-10-22 NOTE — Progress Notes (Signed)
Patient was unaware that ortho shoe had velcro on sides

## 2019-10-22 NOTE — Progress Notes (Signed)
Established Patient Office Visit  Subjective:  Patient ID: Glen Lee, male    DOB: [birth year]  Age: 86 y.o. MRN: [id number]  CC:  Chief Complaint  Patient presents with  . Open Wound of left elbow    HPI Glen Lee presents for patient returns for open wound left elbow .  It is not infected and healing in.  Past Medical History:  Diagnosis Date  . Anemia   . Arthritis   . Atrial fibrillation ([place])   . Benign prostate hyperplasia   . Diabetes mellitus without complication ([place])   . Edema    right leg  . Enlarged prostate   . Esophageal reflux   . Essential hypertension   . Hyperlipidemia   . Hypertension   . Mixed hyperlipidemia   . Tremor    right hand  . Type 2 diabetes mellitus with other specified complication ([place])   . Vitamin D deficiency     Past Surgical History:  Procedure Laterality Date  . JOINT REPLACEMENT Left    knee  . KNEE SURGERY    . ORIF ANKLE FRACTURE Right 06/30/2016   Procedure: OPEN REDUCTION INTERNAL FIXATION (ORIF) ANKLE FRACTURE;  Surgeon: Altamese [place], MD;  Location: [facility];  Service: Orthopedics;  Laterality: Right;    Family History  Problem Relation Age of Onset  . Hyperlipidemia Mother   . Diabetes Mother   . Colon cancer Father   . Diabetes Father     Social History   Socioeconomic History  . Marital status: Single    Spouse name: Not on file  . Number of children: Not on file  . Years of education: Not on file  . Highest education level: Not on file  Occupational History  . Not on file  Tobacco Use  . Smoking status: Former Smoker    Years: 20.00    Types: Cigarettes    Quit date: 08/04/1992    Years since quitting: 27.2  . Smokeless tobacco: Never Used  Substance and Sexual Activity  . Alcohol use: Not Currently    Alcohol/week: 0.0 standard drinks  . Drug use: Never  . Sexual activity: Not on file  Other Topics Concern  . Not on file  Social History Narrative  . Not on file   Social Determinants  of Health   Financial Resource Strain:   . Difficulty of Paying Living Expenses:   Food Insecurity:   . Worried About Charity fundraiser in the Last Year:   . Arboriculturist in the Last Year:   Transportation Needs:   . Film/video editor (Medical):   Marland Kitchen Lack of Transportation (Non-Medical):   Physical Activity:   . Days of Exercise per Week:   . Minutes of Exercise per Session:   Stress:   . Feeling of Stress :   Social Connections:   . Frequency of Communication with Friends and Family:   . Frequency of Social Gatherings with Friends and Family:   . Attends Religious Services:   . Active Member of Clubs or Organizations:   . Attends Archivist Meetings:   Marland Kitchen Marital Status:   Intimate Partner Violence:   . Fear of Current or Ex-Partner:   . Emotionally Abused:   Marland Kitchen Physically Abused:   . Sexually Abused:     Outpatient Medications Prior to Visit  Medication Sig Dispense Refill  . acetaminophen (TYLENOL) 500 MG tablet Take 1-2 tablets (500-1,000 mg total) by mouth every 6 (  six) hours as needed. 60 tablet 0  . diltiazem (CARDIZEM CD) 120 MG 24 hr capsule Take 1 capsule (120 mg total) by mouth daily. 30 capsule 0  . ferrous sulfate 325 (65 FE) MG tablet Take 325 mg by mouth daily with breakfast.    . finasteride (PROSCAR) 5 MG tablet TAKE 1 TABLET BY MOUTH EVERY DAY 90 tablet 2  . furosemide (LASIX) 20 MG tablet Take 20 mg by mouth daily.    Marland Kitchen ketoconazole (NIZORAL) 2 % shampoo USE AS DIRECTED, ROTATE BETWEEN OVER THE COUNTER SHAMPOOS    . Mesalamine 800 MG TBEC Take 800 mg by mouth 3 (three) times daily.    . pantoprazole (PROTONIX) 20 MG tablet TAKE 1 TABLET BY MOUTH EVERY DAY AS DIRECTED 90 tablet 3  . pantoprazole (PROTONIX) 40 MG tablet Take 40 mg by mouth daily.    . simvastatin (ZOCOR) 20 MG tablet TAKE 1 TABLET BY MOUTH EVERY DAY 90 tablet 2  . tamsulosin (FLOMAX) 0.4 MG CAPS capsule Take 0.4 mg by mouth daily. Reported on 08/17/2015    . TRADJENTA 5 MG  TABS tablet Take 5 mg by mouth daily.     No facility-administered medications prior to visit.    Allergies  Allergen Reactions  . Torsemide   . Keflet [Cephalexin] Rash    ROS Review of Systems  Constitutional: Negative.   HENT: Negative.   Eyes: Negative.   Cardiovascular: Negative.   Endocrine: Negative.   Genitourinary: Negative.   Skin:       Healing wound left wlbow  Neurological: Negative.   Psychiatric/Behavioral: Negative.       Objective:    Physical Exam  Constitutional: He appears well-developed and well-nourished.  Cardiovascular: Normal rate, regular rhythm, normal heart sounds and intact distal pulses.  Pulmonary/Chest: Effort normal and breath sounds normal.  Skin: Skin is warm and dry.  3 by 5 cm skin avulsion left elbow with good granulation tissue  Vitals reviewed.   BP 132/60 (BP Location: Right Arm, Patient Position: Sitting)   Pulse 78   Temp (!) 96.9 F (36.1 C) (Temporal)   Resp 17   Ht 5' 10"  (1.778 m)   Wt 193 lb (87.5 kg)   SpO2 99%   BMI 27.69 kg/m  Wt Readings from Last 3 Encounters:  10/22/19 193 lb (87.5 kg)  10/16/19 197 lb (89.4 kg)  10/13/19 196 lb (88.9 kg)     Health Maintenance Due  Topic Date Due  . OPHTHALMOLOGY EXAM  Never done  . URINE MICROALBUMIN  Never done  . COVID-19 Vaccine (1) Never done  . PNA vac Low Risk Adult (1 of 2 - PCV13) Never done    There are no preventive care reminders to display for this patient.  Lab Results  Component Value Date   TSH 0.713 08/05/2014   Lab Results  Component Value Date   WBC 5.8 09/12/2019   HGB 11.3 (L) 09/12/2019   HCT 33.9 (L) 09/12/2019   MCV 94 09/12/2019   PLT 154 09/12/2019   Lab Results  Component Value Date   NA 146 (H) 09/12/2019   K 4.4 09/12/2019   CO2 24 09/12/2019   GLUCOSE 133 (H) 09/12/2019   BUN 32 (H) 09/12/2019   CREATININE 1.44 (H) 09/12/2019   BILITOT 0.6 09/12/2019   ALKPHOS 121 (H) 09/12/2019   AST 15 09/12/2019   ALT 12  09/12/2019   PROT 6.8 09/12/2019   ALBUMIN 4.4 09/12/2019   CALCIUM 9.9 09/12/2019  ANIONGAP 8 07/03/2016   Lab Results  Component Value Date   CHOL 138 09/12/2019   Lab Results  Component Value Date   HDL 66 09/12/2019   Lab Results  Component Value Date   LDLCALC 57 09/12/2019   Lab Results  Component Value Date   TRIG 76 09/12/2019   Lab Results  Component Value Date   CHOLHDL 2.1 09/12/2019   Lab Results  Component Value Date   HGBA1C 6.4 (H) 09/12/2019      Assessment & Plan:   Problem List Items Addressed This Visit      Other   Open wound of left elbow - Primary    The open wound is clean and slowly granulating in well.  Keep nonstick dressing and follow up one week.         No orders of the defined types were placed in this encounter.   Follow-up: Return in about 1 week (around 10/29/2019) for left elbow wound.    Reinaldo Meeker, MD

## 2019-10-29 ENCOUNTER — Other Ambulatory Visit: Payer: Self-pay

## 2019-10-29 ENCOUNTER — Other Ambulatory Visit: Payer: Self-pay | Admitting: Legal Medicine

## 2019-10-29 ENCOUNTER — Encounter: Payer: Self-pay | Admitting: Legal Medicine

## 2019-10-29 ENCOUNTER — Ambulatory Visit: Payer: Medicare Other | Admitting: Legal Medicine

## 2019-10-29 VITALS — BP 118/66 | HR 78 | Temp 97.2°F | Resp 18 | Ht 70.0 in | Wt 197.0 lb

## 2019-10-29 DIAGNOSIS — S51002D Unspecified open wound of left elbow, subsequent encounter: Secondary | ICD-10-CM | POA: Diagnosis not present

## 2019-10-29 DIAGNOSIS — I10 Essential (primary) hypertension: Secondary | ICD-10-CM

## 2019-10-29 DIAGNOSIS — K5100 Ulcerative (chronic) pancolitis without complications: Secondary | ICD-10-CM

## 2019-10-29 NOTE — Assessment & Plan Note (Signed)
Improved healing with the open wound size greatly decreasing and no infection  Plan to keep dressing and follow up 2 weeks

## 2019-10-29 NOTE — Progress Notes (Signed)
Established Patient Office Visit  Subjective:  Patient ID: Glen Lee, male    DOB: 02/23/1934  Age: 86 y.o. MRN: [id number]  CC:  Chief Complaint  Patient presents with  . Open wound Left elbow    HPI Glen Lee presents for left elbow  Wound.  It is healing well and no infection it is now 1 by 3 cm.  Past Medical History:  Diagnosis Date  . Anemia   . Arthritis   . Atrial fibrillation ([place])   . Benign prostate hyperplasia   . Diabetes mellitus without complication ([place])   . Edema    right leg  . Enlarged prostate   . Esophageal reflux   . Essential hypertension   . Hyperlipidemia   . Hypertension   . Mixed hyperlipidemia   . Tremor    right hand  . Type 2 diabetes mellitus with other specified complication ([place])   . Vitamin D deficiency     Past Surgical History:  Procedure Laterality Date  . JOINT REPLACEMENT Left    knee  . KNEE SURGERY    . ORIF ANKLE FRACTURE Right 06/30/2016   Procedure: OPEN REDUCTION INTERNAL FIXATION (ORIF) ANKLE FRACTURE;  Surgeon: Altamese [place], MD;  Location: [facility];  Service: Orthopedics;  Laterality: Right;    Family History  Problem Relation Age of Onset  . Hyperlipidemia Mother   . Diabetes Mother   . Colon cancer Father   . Diabetes Father     Social History   Socioeconomic History  . Marital status: Single    Spouse name: Not on file  . Number of children: Not on file  . Years of education: Not on file  . Highest education level: Not on file  Occupational History  . Not on file  Tobacco Use  . Smoking status: Former Smoker    Years: 20.00    Types: Cigarettes    Quit date: 08/04/1992    Years since quitting: 27.2  . Smokeless tobacco: Never Used  Substance and Sexual Activity  . Alcohol use: Not Currently    Alcohol/week: 0.0 standard drinks  . Drug use: Never  . Sexual activity: Not on file  Other Topics Concern  . Not on file  Social History Narrative  . Not on file   Social Determinants of  Health   Financial Resource Strain:   . Difficulty of Paying Living Expenses:   Food Insecurity:   . Worried About Charity fundraiser in the Last Year:   . Arboriculturist in the Last Year:   Transportation Needs:   . Film/video editor (Medical):   Marland Kitchen Lack of Transportation (Non-Medical):   Physical Activity:   . Days of Exercise per Week:   . Minutes of Exercise per Session:   Stress:   . Feeling of Stress :   Social Connections:   . Frequency of Communication with Friends and Family:   . Frequency of Social Gatherings with Friends and Family:   . Attends Religious Services:   . Active Member of Clubs or Organizations:   . Attends Archivist Meetings:   Marland Kitchen Marital Status:   Intimate Partner Violence:   . Fear of Current or Ex-Partner:   . Emotionally Abused:   Marland Kitchen Physically Abused:   . Sexually Abused:     Outpatient Medications Prior to Visit  Medication Sig Dispense Refill  . acetaminophen (TYLENOL) 500 MG tablet Take 1-2 tablets (500-1,000 mg total) by mouth  every 6 (six) hours as needed. 60 tablet 0  . diltiazem (CARDIZEM CD) 120 MG 24 hr capsule TAKE 1 CAPSULE BY MOUTH EVERY DAY 90 capsule 2  . ferrous sulfate 325 (65 FE) MG tablet Take 325 mg by mouth daily with breakfast.    . finasteride (PROSCAR) 5 MG tablet TAKE 1 TABLET BY MOUTH EVERY DAY 90 tablet 2  . furosemide (LASIX) 20 MG tablet Take 20 mg by mouth daily.    Marland Kitchen ketoconazole (NIZORAL) 2 % shampoo USE AS DIRECTED, ROTATE BETWEEN OVER THE COUNTER SHAMPOOS    . Mesalamine 800 MG TBEC TAKE 1 TABLET BY MOUTH TWICE A DAY 180 tablet 2  . pantoprazole (PROTONIX) 20 MG tablet TAKE 1 TABLET BY MOUTH EVERY DAY AS DIRECTED 90 tablet 3  . pantoprazole (PROTONIX) 40 MG tablet Take 40 mg by mouth daily.    . simvastatin (ZOCOR) 20 MG tablet TAKE 1 TABLET BY MOUTH EVERY DAY 90 tablet 2  . tamsulosin (FLOMAX) 0.4 MG CAPS capsule Take 0.4 mg by mouth daily. Reported on 08/17/2015    . TRADJENTA 5 MG TABS tablet Take  5 mg by mouth daily.     No facility-administered medications prior to visit.    Allergies  Allergen Reactions  . Torsemide   . Keflet [Cephalexin] Rash    ROS Review of Systems  Constitutional: Negative.   HENT: Negative.   Eyes: Negative.   Respiratory: Negative.   Cardiovascular: Negative.   Endocrine: Negative.   Genitourinary: Negative.   Musculoskeletal: Negative.   Skin: Positive for wound.  Neurological: Negative.   Psychiatric/Behavioral: Negative.       Objective:    Physical Exam  Constitutional: He appears well-developed and well-nourished.  Cardiovascular: Normal rate, regular rhythm, normal heart sounds and intact distal pulses.  Pulmonary/Chest: Effort normal and breath sounds normal.  Skin:  Healing skin avulsion left elbow .  No infection.  It is now 1cm by 3 cm with good granulation.  Vitals reviewed.   BP 118/66   Pulse 78   Temp (!) 97.2 F (36.2 C)   Resp 18   Ht 5' 10"  (1.778 m)   Wt 197 lb (89.4 kg)   SpO2 94%   BMI 28.27 kg/m  Wt Readings from Last 3 Encounters:  10/29/19 197 lb (89.4 kg)  10/22/19 193 lb (87.5 kg)  10/16/19 197 lb (89.4 kg)     Health Maintenance Due  Topic Date Due  . OPHTHALMOLOGY EXAM  Never done  . URINE MICROALBUMIN  Never done  . COVID-19 Vaccine (1) Never done  . PNA vac Low Risk Adult (1 of 2 - PCV13) Never done    There are no preventive care reminders to display for this patient.  Lab Results  Component Value Date   TSH 0.713 08/05/2014   Lab Results  Component Value Date   WBC 5.8 09/12/2019   HGB 11.3 (L) 09/12/2019   HCT 33.9 (L) 09/12/2019   MCV 94 09/12/2019   PLT 154 09/12/2019   Lab Results  Component Value Date   NA 146 (H) 09/12/2019   K 4.4 09/12/2019   CO2 24 09/12/2019   GLUCOSE 133 (H) 09/12/2019   BUN 32 (H) 09/12/2019   CREATININE 1.44 (H) 09/12/2019   BILITOT 0.6 09/12/2019   ALKPHOS 121 (H) 09/12/2019   AST 15 09/12/2019   ALT 12 09/12/2019   PROT 6.8  09/12/2019   ALBUMIN 4.4 09/12/2019   CALCIUM 9.9 09/12/2019   ANIONGAP 8  07/03/2016   Lab Results  Component Value Date   CHOL 138 09/12/2019   Lab Results  Component Value Date   HDL 66 09/12/2019   Lab Results  Component Value Date   LDLCALC 57 09/12/2019   Lab Results  Component Value Date   TRIG 76 09/12/2019   Lab Results  Component Value Date   CHOLHDL 2.1 09/12/2019   Lab Results  Component Value Date   HGBA1C 6.4 (H) 09/12/2019      Assessment & Plan:   Problem List Items Addressed This Visit      Other   Open wound of left elbow - Primary    Improved healing with the open wound size greatly decreasing and no infection  Plan to keep dressing and follow up 2 weeks         No orders of the defined types were placed in this encounter.   Follow-up: Return in about 2 weeks (around 11/12/2019).    Reinaldo Meeker, MD

## 2019-11-13 ENCOUNTER — Ambulatory Visit: Payer: Medicare Other | Admitting: Legal Medicine

## 2019-11-13 ENCOUNTER — Other Ambulatory Visit: Payer: Self-pay

## 2019-11-13 ENCOUNTER — Encounter: Payer: Self-pay | Admitting: Legal Medicine

## 2019-11-13 VITALS — BP 130/70 | HR 85 | Temp 97.4°F | Resp 16 | Ht 70.0 in | Wt 198.0 lb

## 2019-11-13 DIAGNOSIS — S51002D Unspecified open wound of left elbow, subsequent encounter: Secondary | ICD-10-CM | POA: Diagnosis not present

## 2019-11-13 NOTE — Progress Notes (Signed)
Established Patient Office Visit  Subjective:  Patient ID: Glen Lee, male    DOB: 02-18-1934  Age: 86 y.o. MRN: [id number]  CC:  Chief Complaint  Patient presents with  . Open Wound of left elbow    HPI LOC FEINSTEIN presents for open wound elbow.  The left elbow open wound is well healed , 2 small area 34m left to heal.  It is itching with healing.  Use cold compresses.  Past Medical History:  Diagnosis Date  . Anemia   . Arthritis   . Atrial fibrillation ([place]   . Benign prostate hyperplasia   . Diabetes mellitus without complication ([place]   . Edema    right leg  . Enlarged prostate   . Esophageal reflux   . Essential hypertension   . Hyperlipidemia   . Hypertension   . Mixed hyperlipidemia   . Tremor    right hand  . Type 2 diabetes mellitus with other specified complication ([place]   . Vitamin D deficiency     Past Surgical History:  Procedure Laterality Date  . JOINT REPLACEMENT Left    knee  . KNEE SURGERY    . ORIF ANKLE FRACTURE Right 06/30/2016   Procedure: OPEN REDUCTION INTERNAL FIXATION (ORIF) ANKLE FRACTURE;  Surgeon: MAltamese Mission MD;  [facility] [facility]  Service: Orthopedics;  Laterality: Right;    Family History  Problem Relation Age of Onset  . Hyperlipidemia Mother   . Diabetes Mother   . Colon cancer Father   . Diabetes Father     Social History   Socioeconomic History  . Marital status: Single    Spouse name: Not on file  . Number of children: Not on file  . Years of education: Not on file  . Highest education level: Not on file  Occupational History  . Not on file  Tobacco Use  . Smoking status: Former Smoker    Years: 20.00    Types: Cigarettes    Quit date: 08/04/1992    Years since quitting: 27.2  . Smokeless tobacco: Never Used  Substance and Sexual Activity  . Alcohol use: Not Currently    Alcohol/week: 0.0 standard drinks  . Drug use: Never  . Sexual activity: Not on file  Other Topics Concern  . Not on file    Social History Narrative  . Not on file   Social Determinants of Health   Financial Resource Strain:   . Difficulty of Paying Living Expenses:   Food Insecurity:   . Worried About RCharity fundraiserin the Last Year:   . RArboriculturistin the Last Year:   Transportation Needs:   . LFilm/video editor(Medical):   .Marland KitchenLack of Transportation (Non-Medical):   Physical Activity:   . Days of Exercise per Week:   . Minutes of Exercise per Session:   Stress:   . Feeling of Stress :   Social Connections:   . Frequency of Communication with Friends and Family:   . Frequency of Social Gatherings with Friends and Family:   . Attends Religious Services:   . Active Member of Clubs or Organizations:   . Attends CArchivistMeetings:   .Marland KitchenMarital Status:   Intimate Partner Violence:   . Fear of Current or Ex-Partner:   . Emotionally Abused:   .Marland KitchenPhysically Abused:   . Sexually Abused:     Outpatient Medications Prior to Visit  Medication Sig Dispense Refill  .  acetaminophen (TYLENOL) 500 MG tablet Take 1-2 tablets (500-1,000 mg total) by mouth every 6 (six) hours as needed. 60 tablet 0  . diltiazem (CARDIZEM CD) 120 MG 24 hr capsule TAKE 1 CAPSULE BY MOUTH EVERY DAY 90 capsule 2  . ferrous sulfate 325 (65 FE) MG tablet Take 325 mg by mouth daily with breakfast.    . finasteride (PROSCAR) 5 MG tablet TAKE 1 TABLET BY MOUTH EVERY DAY 90 tablet 2  . furosemide (LASIX) 20 MG tablet Take 20 mg by mouth daily.    Marland Kitchen ketoconazole (NIZORAL) 2 % shampoo USE AS DIRECTED, ROTATE BETWEEN OVER THE COUNTER SHAMPOOS    . Mesalamine 800 MG TBEC TAKE 1 TABLET BY MOUTH TWICE A DAY 180 tablet 2  . pantoprazole (PROTONIX) 20 MG tablet TAKE 1 TABLET BY MOUTH EVERY DAY AS DIRECTED 90 tablet 3  . simvastatin (ZOCOR) 20 MG tablet TAKE 1 TABLET BY MOUTH EVERY DAY 90 tablet 2  . tamsulosin (FLOMAX) 0.4 MG CAPS capsule Take 0.4 mg by mouth daily. Reported on 08/17/2015    . TRADJENTA 5 MG TABS tablet Take  5 mg by mouth daily.    Marland Kitchen triamcinolone cream (KENALOG) 0.1 % Apply 1 application topically 2 (two) times daily.    . pantoprazole (PROTONIX) 40 MG tablet Take 40 mg by mouth daily.     No facility-administered medications prior to visit.    Allergies  Allergen Reactions  . Torsemide   . Keflet [Cephalexin] Rash    ROS Review of Systems  Constitutional: Negative.   HENT: Negative.   Eyes: Negative.   Respiratory: Negative.   Cardiovascular: Negative.   Genitourinary: Negative.   Musculoskeletal: Negative.   Skin: Positive for wound (left elbow).  Neurological: Negative.       Objective:    Physical Exam  Constitutional: He appears well-developed and well-nourished.  Cardiovascular: Normal rate, regular rhythm, normal heart sounds and intact distal pulses.  Pulmonary/Chest: Effort normal.  Skin:  Wound well healed left elbow.  2 small 60m area , can dress with bandaid  Vitals reviewed.   BP 130/70   Pulse 85   Temp (!) 97.4 F (36.3 C)   Resp 16   Ht 5' 10"  (1.778 m)   Wt 198 lb (89.8 kg)   SpO2 99%   BMI 28.41 kg/m  Wt Readings from Last 3 Encounters:  11/13/19 198 lb (89.8 kg)  10/29/19 197 lb (89.4 kg)  10/22/19 193 lb (87.5 kg)     Health Maintenance Due  Topic Date Due  . OPHTHALMOLOGY EXAM  Never done  . URINE MICROALBUMIN  Never done  . COVID-19 Vaccine (1) Never done  . PNA vac Low Risk Adult (1 of 2 - PCV13) Never done    There are no preventive care reminders to display for this patient.  Lab Results  Component Value Date   TSH 0.713 08/05/2014   Lab Results  Component Value Date   WBC 5.8 09/12/2019   HGB 11.3 (L) 09/12/2019   HCT 33.9 (L) 09/12/2019   MCV 94 09/12/2019   PLT 154 09/12/2019   Lab Results  Component Value Date   NA 146 (H) 09/12/2019   K 4.4 09/12/2019   CO2 24 09/12/2019   GLUCOSE 133 (H) 09/12/2019   BUN 32 (H) 09/12/2019   CREATININE 1.44 (H) 09/12/2019   BILITOT 0.6 09/12/2019   ALKPHOS 121 (H)  09/12/2019   AST 15 09/12/2019   ALT 12 09/12/2019   PROT 6.8  09/12/2019   ALBUMIN 4.4 09/12/2019   CALCIUM 9.9 09/12/2019   ANIONGAP 8 07/03/2016   Lab Results  Component Value Date   CHOL 138 09/12/2019   Lab Results  Component Value Date   HDL 66 09/12/2019   Lab Results  Component Value Date   LDLCALC 57 09/12/2019   Lab Results  Component Value Date   TRIG 76 09/12/2019   Lab Results  Component Value Date   CHOLHDL 2.1 09/12/2019   Lab Results  Component Value Date   HGBA1C 6.4 (H) 09/12/2019      Assessment & Plan:   Problem List Items Addressed This Visit    None     Open wound left elbow: This is healing well and minimal residual that should heal on its own.  Keep bandaid over it.   Follow-up: Return if symptoms worsen or fail to improve.   Reinaldo Meeker, MD

## 2019-11-14 ENCOUNTER — Other Ambulatory Visit: Payer: Self-pay | Admitting: Legal Medicine

## 2019-11-14 MED ORDER — TRIAMCINOLONE ACETONIDE 0.1 % EX CREA: 1.0000 "application " | TOPICAL_CREAM | Freq: Two times a day (BID) | CUTANEOUS | 3 refills | Status: DC

## 2019-12-10 ENCOUNTER — Other Ambulatory Visit: Payer: Self-pay

## 2019-12-10 ENCOUNTER — Ambulatory Visit: Payer: Medicare Other | Admitting: Legal Medicine

## 2019-12-10 ENCOUNTER — Encounter: Payer: Self-pay | Admitting: Legal Medicine

## 2019-12-10 DIAGNOSIS — I872 Venous insufficiency (chronic) (peripheral): Secondary | ICD-10-CM | POA: Diagnosis not present

## 2019-12-10 NOTE — Progress Notes (Signed)
Subjective:  Patient ID: Glen Lee, male    DOB: 11-25-1933  Age: 86 y.o. MRN: [id number]  Chief Complaint  Patient presents with  . Cellulitis    Right leg    HPI: patient has chronic lower extremity venous hypertension with chronic edema.  The right leg has increased swelling and is weeping serosanguanous fluid.   Current Outpatient Medications on File Prior to Visit  Medication Sig Dispense Refill  . traZODone (DESYREL) 100 MG tablet TAKE 1 TABLET BY MOUTH AT NIGHT 90 tablet 2  . acetaminophen (TYLENOL) 500 MG tablet Take 1-2 tablets (500-1,000 mg total) by mouth every 6 (six) hours as needed. 60 tablet 0  . diclofenac Sodium (VOLTAREN) 1 % GEL SMARTSIG:2 Gram(s) Topical 1 to 4 Times Daily    . diltiazem (CARDIZEM CD) 120 MG 24 hr capsule TAKE 1 CAPSULE BY MOUTH EVERY DAY 90 capsule 2  . ferrous sulfate 325 (65 FE) MG tablet Take 325 mg by mouth daily with breakfast.    . finasteride (PROSCAR) 5 MG tablet TAKE 1 TABLET BY MOUTH EVERY DAY 90 tablet 2  . furosemide (LASIX) 20 MG tablet Take 20 mg by mouth daily.    Marland Kitchen ketoconazole (NIZORAL) 2 % shampoo USE AS DIRECTED, ROTATE BETWEEN OVER THE COUNTER SHAMPOOS    . Mesalamine 800 MG TBEC TAKE 1 TABLET BY MOUTH TWICE A DAY 180 tablet 2  . pantoprazole (PROTONIX) 20 MG tablet TAKE 1 TABLET BY MOUTH EVERY DAY AS DIRECTED 90 tablet 3  . simvastatin (ZOCOR) 20 MG tablet TAKE 1 TABLET BY MOUTH EVERY DAY 90 tablet 2  . tamsulosin (FLOMAX) 0.4 MG CAPS capsule Take 0.4 mg by mouth daily. Reported on 08/17/2015    . TRADJENTA 5 MG TABS tablet Take 5 mg by mouth daily.    Marland Kitchen triamcinolone cream (KENALOG) 0.1 % Apply 1 application topically 2 (two) times daily. 453.6 g 3   No current facility-administered medications on file prior to visit.   Past Medical History:  Diagnosis Date  . Anemia   . Arthritis   . Atrial fibrillation ([place])   . Benign prostate hyperplasia   . Diabetes mellitus without complication ([place])   . Edema    right leg    . Enlarged prostate   . Esophageal reflux   . Essential hypertension   . Hyperlipidemia   . Hypertension   . Mixed hyperlipidemia   . Tremor    right hand  . Type 2 diabetes mellitus with other specified complication ([place])   . Vitamin D deficiency    Past Surgical History:  Procedure Laterality Date  . JOINT REPLACEMENT Left    knee  . KNEE SURGERY    . ORIF ANKLE FRACTURE Right 06/30/2016   Procedure: OPEN REDUCTION INTERNAL FIXATION (ORIF) ANKLE FRACTURE;  Surgeon: Altamese Scenic, MD;  Location: [facility];  Service: Orthopedics;  Laterality: Right;    Family History  Problem Relation Age of Onset  . Hyperlipidemia Mother   . Diabetes Mother   . Colon cancer Father   . Diabetes Father    Social History   Socioeconomic History  . Marital status: Single    Spouse name: Not on file  . Number of children: Not on file  . Years of education: Not on file  . Highest education level: Not on file  Occupational History  . Not on file  Tobacco Use  . Smoking status: Former Smoker    Years: 20.00    Types: Cigarettes  Quit date: 08/04/1992    Years since quitting: 27.3  . Smokeless tobacco: Never Used  Vaping Use  . Vaping Use: Never used  Substance and Sexual Activity  . Alcohol use: Not Currently    Alcohol/week: 0.0 standard drinks  . Drug use: Never  . Sexual activity: Not on file  Other Topics Concern  . Not on file  Social History Narrative  . Not on file   Social Determinants of Health   Financial Resource Strain:   . Difficulty of Paying Living Expenses:   Food Insecurity:   . Worried About Charity fundraiser in the Last Year:   . Arboriculturist in the Last Year:   Transportation Needs:   . Film/video editor (Medical):   Marland Kitchen Lack of Transportation (Non-Medical):   Physical Activity:   . Days of Exercise per Week:   . Minutes of Exercise per Session:   Stress:   . Feeling of Stress :   Social Connections:   . Frequency of Communication with Friends  and Family:   . Frequency of Social Gatherings with Friends and Family:   . Attends Religious Services:   . Active Member of Clubs or Organizations:   . Attends Archivist Meetings:   Marland Kitchen Marital Status:     Review of Systems  Constitutional: Negative.   HENT: Negative.   Eyes: Negative.   Respiratory: Negative.   Cardiovascular: Negative.   Gastrointestinal: Negative.   Genitourinary: Negative.   Musculoskeletal: Negative.   Skin: Positive for rash.  Neurological: Negative.   Psychiatric/Behavioral: Negative.      Objective:  BP (!) 126/58   Pulse (!) 104   Temp (!) 97.3 F (36.3 C)   Resp 16   Ht 5' 10"  (1.778 m)   Wt 188 lb 9.6 oz (85.5 kg)   SpO2 97%   BMI 27.06 kg/m   BP/Weight 12/10/2019 11/13/2019 7/42/5956  Systolic BP 387 564 332  Diastolic BP 58 70 66  Wt. (Lbs) 188.6 198 197  BMI 27.06 28.41 28.27    Physical Exam Vitals reviewed.  Constitutional:      Appearance: Normal appearance.  HENT:     Nose: Nose normal.     Mouth/Throat:     Mouth: Mucous membranes are dry.  Cardiovascular:     Rate and Rhythm: Normal rate and regular rhythm.     Pulses: Normal pulses.     Heart sounds: Normal heart sounds.  Pulmonary:     Effort: Pulmonary effort is normal.     Breath sounds: Normal breath sounds.  Abdominal:     Palpations: Abdomen is soft.  Skin:         Comments: Stasis dermatitis with serosanguanous weeping right leg, some erythema no evidence for infection  Neurological:     Mental Status: He is alert.       Lab Results  Component Value Date   WBC 5.8 09/12/2019   HGB 11.3 (L) 09/12/2019   HCT 33.9 (L) 09/12/2019   PLT 154 09/12/2019   GLUCOSE 133 (H) 09/12/2019   CHOL 138 09/12/2019   TRIG 76 09/12/2019   HDL 66 09/12/2019   LDLCALC 57 09/12/2019   ALT 12 09/12/2019   AST 15 09/12/2019   NA 146 (H) 09/12/2019   K 4.4 09/12/2019   CL 108 (H) 09/12/2019   CREATININE 1.44 (H) 09/12/2019   BUN 32 (H) 09/12/2019   CO2  24 09/12/2019   TSH 0.713 08/05/2014  INR 3.22 (H) 08/04/2014   HGBA1C 6.4 (H) 09/12/2019      Assessment & Plan:   1. Chronic venous insufficiency  The right leg was cleaned and treated with unna boot for compression.  He has healed in past well.  Follow up 5 days for recheck.       Follow-up: Return in about 5 days (around 12/15/2019) for for right leg.  An After Visit Summary was printed and given to the patient.  [place] [phone]

## 2019-12-17 ENCOUNTER — Other Ambulatory Visit: Payer: Self-pay

## 2019-12-17 ENCOUNTER — Encounter: Payer: Self-pay | Admitting: Legal Medicine

## 2019-12-17 ENCOUNTER — Ambulatory Visit: Payer: Medicare Other | Admitting: Legal Medicine

## 2019-12-17 VITALS — BP 138/60 | HR 67 | Temp 97.2°F | Resp 16 | Ht 70.0 in | Wt 191.0 lb

## 2019-12-17 DIAGNOSIS — I872 Venous insufficiency (chronic) (peripheral): Secondary | ICD-10-CM | POA: Diagnosis not present

## 2019-12-17 NOTE — Progress Notes (Signed)
Subjective:  Patient ID: Glen Lee, male    DOB: 1934-01-31  Age: 86 y.o. MRN: [id number]  Chief Complaint  Patient presents with  . Chronic venous insufficiency    HPI: patient has chronic lower extremity venous hypertension with chronic edema.  The right leg has increased swelling and is weeping serosanguanous fluid.   12/17/2019 follow up shows healing of right leg with no drainage.  It is not red and no tenderness. Current Outpatient Medications on File Prior to Visit  Medication Sig Dispense Refill  . acetaminophen (TYLENOL) 500 MG tablet Take 1-2 tablets (500-1,000 mg total) by mouth every 6 (six) hours as needed. 60 tablet 0  . diclofenac Sodium (VOLTAREN) 1 % GEL SMARTSIG:2 Gram(s) Topical 1 to 4 Times Daily    . diltiazem (CARDIZEM CD) 120 MG 24 hr capsule TAKE 1 CAPSULE BY MOUTH EVERY DAY 90 capsule 2  . ferrous sulfate 325 (65 FE) MG tablet Take 325 mg by mouth daily with breakfast.    . finasteride (PROSCAR) 5 MG tablet TAKE 1 TABLET BY MOUTH EVERY DAY 90 tablet 2  . furosemide (LASIX) 20 MG tablet Take 20 mg by mouth daily.    Marland Kitchen ketoconazole (NIZORAL) 2 % shampoo USE AS DIRECTED, ROTATE BETWEEN OVER THE COUNTER SHAMPOOS    . Mesalamine 800 MG TBEC TAKE 1 TABLET BY MOUTH TWICE A DAY 180 tablet 2  . pantoprazole (PROTONIX) 20 MG tablet TAKE 1 TABLET BY MOUTH EVERY DAY AS DIRECTED 90 tablet 3  . simvastatin (ZOCOR) 20 MG tablet TAKE 1 TABLET BY MOUTH EVERY DAY 90 tablet 2  . tamsulosin (FLOMAX) 0.4 MG CAPS capsule Take 0.4 mg by mouth daily. Reported on 08/17/2015    . TRADJENTA 5 MG TABS tablet Take 5 mg by mouth daily.    . traZODone (DESYREL) 100 MG tablet TAKE 1 TABLET BY MOUTH AT NIGHT 90 tablet 2  . triamcinolone cream (KENALOG) 0.1 % Apply 1 application topically 2 (two) times daily. 453.6 g 3   No current facility-administered medications on file prior to visit.   Past Medical History:  Diagnosis Date  . Anemia   . Arthritis   . Atrial fibrillation ([place])   .  Benign prostate hyperplasia   . Diabetes mellitus without complication ([place])   . Edema    right leg  . Enlarged prostate   . Esophageal reflux   . Essential hypertension   . Hyperlipidemia   . Hypertension   . Mixed hyperlipidemia   . Tremor    right hand  . Type 2 diabetes mellitus with other specified complication ([place])   . Vitamin D deficiency    Past Surgical History:  Procedure Laterality Date  . JOINT REPLACEMENT Left    knee  . KNEE SURGERY    . ORIF ANKLE FRACTURE Right 06/30/2016   Procedure: OPEN REDUCTION INTERNAL FIXATION (ORIF) ANKLE FRACTURE;  Surgeon: Altamese Fairdealing, MD;  Location: [facility];  Service: Orthopedics;  Laterality: Right;    Family History  Problem Relation Age of Onset  . Hyperlipidemia Mother   . Diabetes Mother   . Colon cancer Father   . Diabetes Father    Social History   Socioeconomic History  . Marital status: Single    Spouse name: Not on file  . Number of children: Not on file  . Years of education: Not on file  . Highest education level: Not on file  Occupational History  . Not on file  Tobacco Use  .  Smoking status: Former Smoker    Years: 20.00    Types: Cigarettes    Quit date: 08/04/1992    Years since quitting: 27.3  . Smokeless tobacco: Never Used  Vaping Use  . Vaping Use: Never used  Substance and Sexual Activity  . Alcohol use: Not Currently    Alcohol/week: 0.0 standard drinks  . Drug use: Never  . Sexual activity: Not on file  Other Topics Concern  . Not on file  Social History Narrative  . Not on file   Social Determinants of Health   Financial Resource Strain:   . Difficulty of Paying Living Expenses:   Food Insecurity:   . Worried About Charity fundraiser in the Last Year:   . Arboriculturist in the Last Year:   Transportation Needs:   . Film/video editor (Medical):   Marland Kitchen Lack of Transportation (Non-Medical):   Physical Activity:   . Days of Exercise per Week:   . Minutes of Exercise per Session:     Stress:   . Feeling of Stress :   Social Connections:   . Frequency of Communication with Friends and Family:   . Frequency of Social Gatherings with Friends and Family:   . Attends Religious Services:   . Active Member of Clubs or Organizations:   . Attends Archivist Meetings:   Marland Kitchen Marital Status:     Review of Systems  Constitutional: Negative.   HENT: Negative.   Eyes: Negative.   Respiratory: Negative.   Cardiovascular: Negative.   Gastrointestinal: Negative.   Genitourinary: Negative.   Musculoskeletal: Negative.   Neurological: Negative.   Psychiatric/Behavioral: Negative.      Objective:  BP 138/60   Pulse 67   Temp (!) 97.2 F (36.2 C)   Resp 16   Ht 5' 10"  (1.778 m)   Wt 191 lb (86.6 kg)   SpO2 100%   BMI 27.41 kg/m   BP/Weight 12/17/2019 2/63/7858 01/15/276  Systolic BP 412 878 676  Diastolic BP 60 58 70  Wt. (Lbs) 191 188.6 198  BMI 27.41 27.06 28.41    Physical Exam Vitals reviewed.  Constitutional:      Appearance: Normal appearance.  HENT:     Nose: Nose normal.     Mouth/Throat:     Mouth: Mucous membranes are dry.  Cardiovascular:     Rate and Rhythm: Normal rate and regular rhythm.     Pulses: Normal pulses.     Heart sounds: Normal heart sounds.  Pulmonary:     Effort: Pulmonary effort is normal.     Breath sounds: Normal breath sounds.  Abdominal:     Palpations: Abdomen is soft.  Skin:         Comments: Stasis dermatitis with some serosanguanous weeping right leg, no  erythema no evidence for infection  Neurological:     Mental Status: He is alert.       Lab Results  Component Value Date   WBC 5.8 09/12/2019   HGB 11.3 (L) 09/12/2019   HCT 33.9 (L) 09/12/2019   PLT 154 09/12/2019   GLUCOSE 133 (H) 09/12/2019   CHOL 138 09/12/2019   TRIG 76 09/12/2019   HDL 66 09/12/2019   LDLCALC 57 09/12/2019   ALT 12 09/12/2019   AST 15 09/12/2019   NA 146 (H) 09/12/2019   K 4.4 09/12/2019   CL 108 (H) 09/12/2019    CREATININE 1.44 (H) 09/12/2019   BUN 32 (H) 09/12/2019  CO2 24 09/12/2019   TSH 0.713 08/05/2014   INR 3.22 (H) 08/04/2014   HGBA1C 6.4 (H) 09/12/2019      Assessment & Plan:   1. Chronic venous insufficiency  The right leg was cleaned and treated with compression dressing that he can remove so he can have shower.  He has healed in past well.  Follow up 5 days for recheck.       Follow-up: Return in 2 weeks (on 12/31/2019), or mcr pe kim, for for recheck of right leg ulcers.  An After Visit Summary was printed and given to the patient.  [place] [phone]

## 2019-12-31 ENCOUNTER — Encounter: Payer: Self-pay | Admitting: Legal Medicine

## 2019-12-31 ENCOUNTER — Ambulatory Visit: Payer: Medicare Other | Admitting: Legal Medicine

## 2019-12-31 ENCOUNTER — Other Ambulatory Visit: Payer: Self-pay

## 2019-12-31 ENCOUNTER — Ambulatory Visit (INDEPENDENT_AMBULATORY_CARE_PROVIDER_SITE_OTHER): Payer: Medicare Other | Admitting: Legal Medicine

## 2019-12-31 VITALS — BP 110/70 | HR 80 | Temp 97.2°F | Resp 16 | Ht 70.0 in | Wt 191.0 lb

## 2019-12-31 DIAGNOSIS — L03031 Cellulitis of right toe: Secondary | ICD-10-CM | POA: Diagnosis not present

## 2019-12-31 DIAGNOSIS — I872 Venous insufficiency (chronic) (peripheral): Secondary | ICD-10-CM

## 2019-12-31 DIAGNOSIS — L02611 Cutaneous abscess of right foot: Secondary | ICD-10-CM | POA: Diagnosis not present

## 2019-12-31 MED ORDER — SULFAMETHOXAZOLE-TRIMETHOPRIM 800-160 MG PO TABS
1.0000 | ORAL_TABLET | Freq: Two times a day (BID) | ORAL | 1 refills | Status: DC
Start: 2019-12-31 — End: 2020-05-17

## 2019-12-31 NOTE — Progress Notes (Signed)
Subjective:  Patient ID: Glen Lee, male    DOB: 1934-05-26  Age: 86 y.o. MRN: [id number]  Chief Complaint  Patient presents with   Chronic Venous Insufficiency    HPI: patient has chronic lower extremity venous hypertension with chronic edema.  The right leg has increased swelling and is weeping serosanguanous fluid.   12/17/2019 follow up shows healing of right leg with no drainage.  It is not red and no tenderness.  12/31/2019 follow up shows right foot is red and new ulcers between toes.  He was found to have maggots between toes that was cleaned out and will be redressed. Dressing between toes to allow air exposure and keep toes clean.   Current Outpatient Medications on File Prior to Visit  Medication Sig Dispense Refill   acetaminophen (TYLENOL) 500 MG tablet Take 1-2 tablets (500-1,000 mg total) by mouth every 6 (six) hours as needed. 60 tablet 0   diclofenac Sodium (VOLTAREN) 1 % GEL SMARTSIG:2 Gram(s) Topical 1 to 4 Times Daily     diltiazem (CARDIZEM CD) 120 MG 24 hr capsule TAKE 1 CAPSULE BY MOUTH EVERY DAY 90 capsule 2   ferrous sulfate 325 (65 FE) MG tablet Take 325 mg by mouth daily with breakfast.     finasteride (PROSCAR) 5 MG tablet TAKE 1 TABLET BY MOUTH EVERY DAY 90 tablet 2   furosemide (LASIX) 20 MG tablet Take 20 mg by mouth daily.     ketoconazole (NIZORAL) 2 % shampoo USE AS DIRECTED, ROTATE BETWEEN OVER THE COUNTER SHAMPOOS     Mesalamine 800 MG TBEC TAKE 1 TABLET BY MOUTH TWICE A DAY 180 tablet 2   pantoprazole (PROTONIX) 20 MG tablet TAKE 1 TABLET BY MOUTH EVERY DAY AS DIRECTED 90 tablet 3   simvastatin (ZOCOR) 20 MG tablet TAKE 1 TABLET BY MOUTH EVERY DAY 90 tablet 2   tamsulosin (FLOMAX) 0.4 MG CAPS capsule Take 0.4 mg by mouth daily. Reported on 08/17/2015     TRADJENTA 5 MG TABS tablet Take 5 mg by mouth daily.     traZODone (DESYREL) 100 MG tablet TAKE 1 TABLET BY MOUTH AT NIGHT 90 tablet 2   triamcinolone cream (KENALOG) 0.1 % Apply 1  application topically 2 (two) times daily. 453.6 g 3   No current facility-administered medications on file prior to visit.   Past Medical History:  Diagnosis Date   Anemia    Arthritis    Atrial fibrillation (HCC)    Benign prostate hyperplasia    Diabetes mellitus without complication ([place])    Edema    right leg   Enlarged prostate    Esophageal reflux    Essential hypertension    Hyperlipidemia    Hypertension    Mixed hyperlipidemia    Tremor    right hand   Type 2 diabetes mellitus with other specified complication (HCC)    Vitamin D deficiency    Past Surgical History:  Procedure Laterality Date   JOINT REPLACEMENT Left    knee   KNEE SURGERY     ORIF ANKLE FRACTURE Right 06/30/2016   Procedure: OPEN REDUCTION INTERNAL FIXATION (ORIF) ANKLE FRACTURE;  Surgeon: Altamese Skellytown, MD;  Location: [facility];  Service: Orthopedics;  Laterality: Right;    Family History  Problem Relation Age of Onset   Hyperlipidemia Mother    Diabetes Mother    Colon cancer Father    Diabetes Father    Social History   Socioeconomic History   Marital status: Single  Spouse name: Not on file   Number of children: Not on file   Years of education: Not on file   Highest education level: Not on file  Occupational History   Not on file  Tobacco Use   Smoking status: Former Smoker    Years: 20.00    Types: Cigarettes    Quit date: 08/04/1992    Years since quitting: 27.4   Smokeless tobacco: Never Used  Vaping Use   Vaping Use: Never used  Substance and Sexual Activity   Alcohol use: Not Currently    Alcohol/week: 0.0 standard drinks   Drug use: Never   Sexual activity: Not on file  Other Topics Concern   Not on file  Social History Narrative   Not on file   Social Determinants of Health   Financial Resource Strain:    Difficulty of Paying Living Expenses:   Food Insecurity:    Worried About Charity fundraiser in the Last Year:    Arts development officer in the Last Year:   Transportation Needs:    Film/video editor (Medical):    Lack of Transportation (Non-Medical):   Physical Activity:    Days of Exercise per Week:    Minutes of Exercise per Session:   Stress:    Feeling of Stress :   Social Connections:    Frequency of Communication with Friends and Family:    Frequency of Social Gatherings with Friends and Family:    Attends Religious Services:    Active Member of Clubs or Organizations:    Attends Archivist Meetings:    Marital Status:     Review of Systems  Constitutional: Negative.   HENT: Negative.   Eyes: Negative.   Respiratory: Negative.   Cardiovascular: Negative.   Gastrointestinal: Negative.   Genitourinary: Negative.   Musculoskeletal: Negative.   Skin: Positive for wound.       Ulcer right lower leg with ulcers and macerated al toes.    Neurological: Negative.   Psychiatric/Behavioral: Negative.      Objective:  BP 110/70    Pulse 80    Temp (!) 97.2 F (36.2 C)    Resp 16    Ht 5' 10"  (1.778 m)    Wt 191 lb (86.6 kg)    SpO2 95%    BMI 27.41 kg/m   BP/Weight 12/31/2019 12/17/2019 12/09/5282  Systolic BP 132 440 102  Diastolic BP 70 60 58  Wt. (Lbs) 191 191 188.6  BMI 27.41 27.41 27.06    Physical Exam Vitals reviewed.  Constitutional:      Appearance: Normal appearance.  HENT:     Nose: Nose normal.     Mouth/Throat:     Mouth: Mucous membranes are dry.  Cardiovascular:     Rate and Rhythm: Normal rate and regular rhythm.     Pulses: Normal pulses.     Heart sounds: Normal heart sounds.  Pulmonary:     Effort: Pulmonary effort is normal.     Breath sounds: Normal breath sounds.  Abdominal:     Palpations: Abdomen is soft.  Skin:         Comments: Stasis dermatitis with some serosanguanous weeping right leg, he has redness between toes and some skin breakdown, maggots were removed between toes and toes separated and dressed  Neurological:     Mental  Status: He is alert.       Lab Results  Component Value Date   WBC 5.8  09/12/2019   HGB 11.3 (L) 09/12/2019   HCT 33.9 (L) 09/12/2019   PLT 154 09/12/2019   GLUCOSE 133 (H) 09/12/2019   CHOL 138 09/12/2019   TRIG 76 09/12/2019   HDL 66 09/12/2019   LDLCALC 57 09/12/2019   ALT 12 09/12/2019   AST 15 09/12/2019   NA 146 (H) 09/12/2019   K 4.4 09/12/2019   CL 108 (H) 09/12/2019   CREATININE 1.44 (H) 09/12/2019   BUN 32 (H) 09/12/2019   CO2 24 09/12/2019   TSH 0.713 08/05/2014   INR 3.22 (H) 08/04/2014   HGBA1C 6.4 (H) 09/12/2019      Assessment & Plan:   1. Chronic venous insufficiency Patient has poor circulation in right leg with chronic edema.  He takes poor care of himself and we discussed this with caregiver.  2. Cellulitis and abscess of toe of right foot - sulfamethoxazole-trimethoprim (BACTRIM DS) 800-160 MG tablet; Take 1 tablet by mouth 2 (two) times daily.  Dispense: 20 tablet; Refill: 1  The right leg was cleaned and treated with compression dressing that he can remove so he can have shower.  He has healed in past well.  Follow up 5 days for recheck.       Follow-up: Return in about 5 days (around 01/05/2020) for cellulitis leg.  An After Visit Summary was printed and given to the patient.  [place] [id number]

## 2020-01-03 MED ORDER — DOXYCYCLINE HYCLATE 100 MG PO TABS: 100.0000 mg | ORAL_TABLET | Freq: Two times a day (BID) | ORAL | 0 refills | Status: DC

## 2020-01-03 NOTE — Addendum Note (Signed)
Addended by: Reinaldo Meeker on: 01/03/2020 11:40 AM   Modules accepted: Orders

## 2020-01-05 ENCOUNTER — Ambulatory Visit: Payer: Medicare Other | Admitting: Legal Medicine

## 2020-01-12 ENCOUNTER — Ambulatory Visit: Payer: Medicare Other | Admitting: Legal Medicine

## 2020-01-27 ENCOUNTER — Ambulatory Visit: Payer: Medicare Other

## 2020-02-04 ENCOUNTER — Ambulatory Visit: Payer: Medicare Other | Admitting: Family Medicine

## 2020-02-09 ENCOUNTER — Ambulatory Visit (INDEPENDENT_AMBULATORY_CARE_PROVIDER_SITE_OTHER): Payer: Medicare Other | Admitting: Family Medicine

## 2020-02-09 ENCOUNTER — Other Ambulatory Visit: Payer: Self-pay

## 2020-02-09 VITALS — BP 118/78 | Temp 97.1°F | Ht 69.0 in | Wt 187.0 lb

## 2020-02-09 DIAGNOSIS — Z0000 Encounter for general adult medical examination without abnormal findings: Secondary | ICD-10-CM

## 2020-02-09 DIAGNOSIS — N184 Chronic kidney disease, stage 4 (severe): Secondary | ICD-10-CM

## 2020-02-09 DIAGNOSIS — I10 Essential (primary) hypertension: Secondary | ICD-10-CM

## 2020-02-09 DIAGNOSIS — E782 Mixed hyperlipidemia: Secondary | ICD-10-CM | POA: Diagnosis not present

## 2020-02-09 DIAGNOSIS — L02419 Cutaneous abscess of limb, unspecified: Secondary | ICD-10-CM

## 2020-02-09 DIAGNOSIS — E1169 Type 2 diabetes mellitus with other specified complication: Secondary | ICD-10-CM

## 2020-02-09 DIAGNOSIS — L03119 Cellulitis of unspecified part of limb: Secondary | ICD-10-CM

## 2020-02-09 LAB — GLUCOSE, POCT (MANUAL RESULT ENTRY): POC Glucose: 114 mg/dl — AB (ref 70–99)

## 2020-02-09 MED ORDER — CLINDAMYCIN HCL 300 MG PO CAPS: 300.0000 mg | ORAL_CAPSULE | Freq: Three times a day (TID) | ORAL | 0 refills | Status: DC

## 2020-02-09 NOTE — Progress Notes (Signed)
Subjective:   Glen Lee is a 86 y.o. male who presents for Medicare Annual/Subsequent preventive examination. Pt with reaction to medication? -Bactrim/dehydration-5/23 Rehab-2 weeks after hospitalization-back at home-by himself-pt states he lives in a trailer  Review of Systems     right lower leg ulcer -noted by [facility] last week-uniboots in the past Diabetes-pt does not check glucose readings HTN Hyperlipidemia BPH Afib'     Objective:    Today's Vitals   02/09/20 1103  BP: 118/78  Temp: (!) 97.1 F (36.2 C)  Weight: 187 lb (84.8 kg)  Height: 5' 9"  (1.753 m)   Body mass index is 27.62 kg/m.  Advanced Directives 04/17/2018 06/30/2016 06/29/2016 05/14/2015 08/04/2014  Does Patient Have a Medical Advance Directive? Yes - Yes Yes Yes  Type of Paramedic of [place];Living will [place];Living will [place];Living will Living will;Healthcare Power of Attorney Living will;Healthcare Power of Attorney  Does patient want to make changes to medical advance directive? - - - No - Patient declined No - Patient declined  Copy of Healthcare Power of Attorney in Chart? - No - copy requested No - copy requested Yes No - copy requested  Would patient like information on creating a medical advance directive? - No - Patient declined No - Patient declined - -    Current Medications (verified) Outpatient Encounter Medications as of 02/09/2020  Medication Sig  . acetaminophen (TYLENOL) 500 MG tablet Take 1-2 tablets (500-1,000 mg total) by mouth every 6 (six) hours as needed.  . clindamycin (CLEOCIN) 300 MG capsule Take 1 capsule (300 mg total) by mouth 3 (three) times daily.  . diclofenac Sodium (VOLTAREN) 1 % GEL SMARTSIG:2 Gram(s) Topical 1 to 4 Times Daily  . diltiazem (CARDIZEM CD) 120 MG 24 hr capsule TAKE 1 CAPSULE BY MOUTH EVERY DAY  . doxycycline (VIBRA-TABS) 100 MG tablet Take 1 tablet (100 mg total) by mouth 2 (two) times  daily.  . ferrous sulfate 325 (65 FE) MG tablet Take 325 mg by mouth daily with breakfast.  . finasteride (PROSCAR) 5 MG tablet TAKE 1 TABLET BY MOUTH EVERY DAY  . furosemide (LASIX) 20 MG tablet Take 20 mg by mouth daily.  Marland Kitchen ketoconazole (NIZORAL) 2 % shampoo USE AS DIRECTED, ROTATE BETWEEN OVER THE COUNTER SHAMPOOS  . Mesalamine 800 MG TBEC TAKE 1 TABLET BY MOUTH TWICE A DAY  . pantoprazole (PROTONIX) 20 MG tablet TAKE 1 TABLET BY MOUTH EVERY DAY AS DIRECTED  . simvastatin (ZOCOR) 20 MG tablet TAKE 1 TABLET BY MOUTH EVERY DAY  . sulfamethoxazole-trimethoprim (BACTRIM DS) 800-160 MG tablet Take 1 tablet by mouth 2 (two) times daily.  . tamsulosin (FLOMAX) 0.4 MG CAPS capsule Take 0.4 mg by mouth daily. Reported on 08/17/2015  . TRADJENTA 5 MG TABS tablet Take 5 mg by mouth daily.  . traZODone (DESYREL) 100 MG tablet TAKE 1 TABLET BY MOUTH AT NIGHT  . triamcinolone cream (KENALOG) 0.1 % Apply 1 application topically 2 (two) times daily.   No facility-administered encounter medications on file as of 02/09/2020.    Allergies (verified) Sulfa antibiotics, Torsemide, and Keflet [cephalexin]  Pt does not like cipro History: Past Medical History:  Diagnosis Date  . Anemia   . Arthritis   . Atrial fibrillation ([place])   . Benign prostate hyperplasia   . Diabetes mellitus without complication ([place])   . Edema    right leg  . Enlarged prostate   . Esophageal reflux   .  Essential hypertension   . Hyperlipidemia   . Hypertension   . Mixed hyperlipidemia   . Tremor    right hand  . Type 2 diabetes mellitus with other specified complication ([place])   . Vitamin D deficiency    Past Surgical History:  Procedure Laterality Date  . JOINT REPLACEMENT Left    knee  . KNEE SURGERY    . ORIF ANKLE FRACTURE Right 06/30/2016   Procedure: OPEN REDUCTION INTERNAL FIXATION (ORIF) ANKLE FRACTURE;  Surgeon: Altamese Lynch, MD;  Location: [place];  Service: Orthopedics;  Laterality: Right;   Family History    Problem Relation Age of Onset  . Hyperlipidemia Mother   . Diabetes Mother   . Colon cancer Father   . Diabetes Father    Social History   Socioeconomic History  . Marital status: Single    Spouse name: Not on file  . Number of children: Not on file  . Years of education: Not on file  . Highest education level: Not on file  Occupational History  . Not on file  Tobacco Use  . Smoking status: Former Smoker    Years: 20.00    Types: Cigarettes    Quit date: 08/04/1992    Years since quitting: 27.5  . Smokeless tobacco: Never Used  Vaping Use  . Vaping Use: Never used  Substance and Sexual Activity  . Alcohol use: Not Currently    Alcohol/week: 0.0 standard drinks  . Drug use: Never  . Sexual activity: Not on file  Other Topics Concern  . Not on file  Social History Narrative   Concern for pt ability to care for himself. Pt refused placement post hospitalization after rehab   Social Determinants of Health   Financial Resource Strain:   . Difficulty of Paying Living Expenses: Not on file  Food Insecurity:   . Worried About Charity fundraiser in the Last Year: Not on file  . Ran Out of Food in the Last Year: Not on file  Transportation Needs:   . Lack of Transportation (Medical): Not on file  . Lack of Transportation (Non-Medical): Not on file  Physical Activity:   . Days of Exercise per Week: Not on file  . Minutes of Exercise per Session: Not on file  Stress:   . Feeling of Stress : Not on file  Social Connections:   . Frequency of Communication with Friends and Family: Not on file  . Frequency of Social Gatherings with Friends and Family: Not on file  . Attends Religious Services: Not on file  . Active Member of Clubs or Organizations: Not on file  . Attends Archivist Meetings: Not on file  . Marital Status: Not on file  pt wears hearing aids Tobacco Counseling No tobacco use    Diabetic?yes-glucose reading 114- POC         Activities of  Daily Living  Patient Care Team: [name], MD as PCP - General (Family Medicine) O'Neal, Cassie Freer, MD as PCP - Cardiology (Cardiology)  Assessment:   This is a routine wellness examination for [place]. Dietary issues and exercise activities discussed: pt with limited ROM     Goals   None    Depression Screen PHQ 2/9 Scores 09/12/2019  PHQ - 2 Score 0    Fall Risk Fall Risk  12/10/2019 09/12/2019  Falls in the past year? 1 -  Number falls in past yr: 0 0  Injury with Fall? 1 0  Risk  for fall due to : History of fall(s);Impaired balance/gait;Impaired mobility History of fall(s)  Follow up Falls evaluation completed Falls evaluation completed    Any stairs in or around the home? No stairs If so, are there any without handrails? Yes in bathroom Home free of loose throw rugs in walkways, pet beds, electrical cords, etc? rugs Adequate lighting in your home to reduce risk of falls? Adequate lighting  ASSISTIVE DEVICES UTILIZED TO PREVENT FALLS:  Life alert? yes Use of a cane, walker or w/c? walker Grab bars in the bathroom?yes Shower chair or bench in shower? yes Elevated toilet seat or a handicapped toilet? yes Limited ability to [facility]  Cognitive Function:memory not a concern per pt and niece        Immunizations Immunization History  Administered Date(s) Administered  . Tdap 10/13/2019   No flu shot available for 21 Qualifies for Shingles Vaccine? Zovirax-TX Screening Tests Health Maintenance  Topic Date Due  . OPHTHALMOLOGY EXAM  Never done  . URINE MICROALBUMIN  Never done  . COVID-19 Vaccine (1) Never done  . PNA vac Low Risk Adult (1 of 2 - PCV13) Never done  . INFLUENZA VACCINE  Never done  . HEMOGLOBIN A1C  08/09/2020  . FOOT EXAM  09/11/2020  . TETANUS/TDAP  10/12/2029    Health Maintenance  Health Maintenance Due  Topic Date Due  . OPHTHALMOLOGY EXAM  Never done  . URINE MICROALBUMIN  Never done  . COVID-19 Vaccine (1)  Never done  . PNA vac Low Risk Adult (1 of 2 - PCV13) Never done  . INFLUENZA VACCINE  Never done  quit 1984-1/2/ pack/day Today's Vitals   02/09/20 1103  BP: 118/78  Temp: (!) 97.1 F (36.2 C)  Weight: 187 lb (84.8 kg)  Height: 5' 9"  (1.753 m)   Body mass index is 27.62 kg/m. PE-right leg cellulitis-ulceration-drainage cultured   Additional Screening:  Hepatitis C Screening:  Has not completed  Vision Screening: Recommended annual ophthalmology exams for early detection of glaucoma and other disorders of the eye. Is the patient up to date with their annual eye exam?  need to schedule appt  Dental Screening: Recommended annual dental exams for proper oral hygiene-dentures   Plan:    1. CKD (chronic kidney disease) stage 4, GFR 15-29 ml/min (HCC) - CBC w/Diff/Platelet  2. Type 2 diabetes mellitus with other specified complication, unspecified whether long term insulin use (HCC) hospitalized-needs follow up Pt needs fasting labwork - Hemoglobin A1c - POCT glucose (manual entry) - Microalbumin, urine - Comprehensive Metabolic Panel (CMET)  3. Essential hypertension Cardizem/lasix-stable  4. Mixed hyperlipidemia Check for stability - Lipid Panel w/o Chol/HDL Ratio  5. Cellulitis and abscess of lower extremity Clindamycin-allergy to sulfa/keflex-declines cipro-clindamycin-no renal adjustment-called home health to discuss wound care-h/o of cellulitis-concern for worsening since pt returned home-HHC to evaluate tomorrow  - Wound culture-pending-d/w pt and niece concerns about cellulitis-concerns about self care with current limited ROM  6. Encounter for Medicare annual wellness exam Pt living at home after being discharged from hospital-rehab-d/w niece -pt refused long term placement knowing of limited ability to care for himself. HHC in the home-will evaluate tomorrow.  I have personally reviewed and noted the following in the patient's chart:  Recommended  influenza/COVID/pneumonia vaccines . Medical and social history . Use of alcohol, tobacco or illicit drugs  . Current medications and supplements . Functional ability and status . Nutritional status . Physical activity . Advanced directives . List of other physicians . Hospitalizations, surgeries,  and ER visits in previous 12 months . Vitals . Screenings to include cognitive, depression, and falls . Referrals and appointments  In addition, I have reviewed and discussed with patient certain preventive protocols, quality metrics, and best practice recommendations. A written personalized care plan for preventive services as well as general preventive health recommendations were provided to patient.   Pt a fall risk-2 recent falls-recently in rehab-HHC at home-concern for pts ability to care for self. Niece aware of concerns and pt unwilling to go to extended care facility. Niece arranged post discharge and patient refused placement-pt aware of concerns. Pt aware of concerns with cellulitis   Mertha Baars, MD   02/12/2020

## 2020-02-09 NOTE — Patient Instructions (Signed)

## 2020-02-10 LAB — CBC WITH DIFFERENTIAL/PLATELET
Basophils Absolute: 0.1 10*3/uL (ref 0.0–0.2)
Basos: 1 %
EOS (ABSOLUTE): 0.2 10*3/uL (ref 0.0–0.4)
Eos: 2 %
Hematocrit: 30.9 % — ABNORMAL LOW (ref 37.5–51.0)
Hemoglobin: 10 g/dL — ABNORMAL LOW (ref 13.0–17.7)
Immature Grans (Abs): 0 10*3/uL (ref 0.0–0.1)
Immature Granulocytes: 1 %
Lymphocytes Absolute: 1 10*3/uL (ref 0.7–3.1)
Lymphs: 14 %
MCH: 28.8 pg (ref 26.6–33.0)
MCHC: 32.4 g/dL (ref 31.5–35.7)
MCV: 89 fL (ref 79–97)
Monocytes Absolute: 0.6 10*3/uL (ref 0.1–0.9)
Monocytes: 8 %
Neutrophils Absolute: 5.4 10*3/uL (ref 1.4–7.0)
Neutrophils: 74 %
Platelets: 187 10*3/uL (ref 150–450)
RBC: 3.47 x10E6/uL — ABNORMAL LOW (ref 4.14–5.80)
RDW: 15.8 % — ABNORMAL HIGH (ref 11.6–15.4)
WBC: 7.2 10*3/uL (ref 3.4–10.8)

## 2020-02-10 LAB — LIPID PANEL W/O CHOL/HDL RATIO
Cholesterol, Total: 135 mg/dL (ref 100–199)
HDL: 63 mg/dL (ref >39)
LDL Chol Calc (NIH): 57 mg/dL (ref 0–99)
Triglycerides: 75 mg/dL (ref 0–149)
VLDL Cholesterol Cal: 15 mg/dL (ref 5–40)

## 2020-02-10 LAB — COMPREHENSIVE METABOLIC PANEL
ALT: 7 IU/L (ref 0–44)
AST: 11 IU/L (ref 0–40)
Albumin/Globulin Ratio: 1.6 (ref 1.2–2.2)
Albumin: 4.1 g/dL (ref 3.6–4.6)
Alkaline Phosphatase: 130 IU/L — ABNORMAL HIGH (ref 48–121)
BUN/Creatinine Ratio: 16 (ref 10–24)
BUN: 22 mg/dL (ref 8–27)
Bilirubin Total: 0.4 mg/dL (ref 0.0–1.2)
CO2: 24 mmol/L (ref 20–29)
Calcium: 9.5 mg/dL (ref 8.6–10.2)
Chloride: 104 mmol/L (ref 96–106)
Creatinine, Ser: 1.34 mg/dL — ABNORMAL HIGH (ref 0.76–1.27)
GFR calc Af Amer: 56 mL/min/{1.73_m2} — ABNORMAL LOW (ref >59)
GFR calc non Af Amer: 49 mL/min/{1.73_m2} — ABNORMAL LOW (ref >59)
Globulin, Total: 2.5 g/dL (ref 1.5–4.5)
Glucose: 121 mg/dL — ABNORMAL HIGH (ref 65–99)
Potassium: 4.3 mmol/L (ref 3.5–5.2)
Sodium: 142 mmol/L (ref 134–144)
Total Protein: 6.6 g/dL (ref 6.0–8.5)

## 2020-02-10 LAB — HEMOGLOBIN A1C
Est. average glucose Bld gHb Est-mCnc: 143 mg/dL
Hgb A1c MFr Bld: 6.6 % — ABNORMAL HIGH (ref 4.8–5.6)

## 2020-02-10 LAB — CARDIOVASCULAR RISK ASSESSMENT

## 2020-02-11 ENCOUNTER — Encounter: Payer: Self-pay | Admitting: Family Medicine

## 2020-02-12 ENCOUNTER — Encounter: Payer: Self-pay | Admitting: Family Medicine

## 2020-02-12 LAB — WOUND CULTURE: Organism ID, Bacteria: NONE SEEN

## 2020-03-08 ENCOUNTER — Other Ambulatory Visit: Payer: Self-pay | Admitting: Physician Assistant

## 2020-03-08 DIAGNOSIS — E1142 Type 2 diabetes mellitus with diabetic polyneuropathy: Secondary | ICD-10-CM

## 2020-05-17 ENCOUNTER — Encounter: Payer: Self-pay | Admitting: Legal Medicine

## 2020-05-17 ENCOUNTER — Ambulatory Visit: Payer: Medicare Other | Admitting: Legal Medicine

## 2020-05-17 ENCOUNTER — Other Ambulatory Visit: Payer: Self-pay

## 2020-05-17 VITALS — BP 122/62 | HR 86 | Temp 97.6°F | Resp 18 | Ht 69.0 in | Wt 194.8 lb

## 2020-05-17 DIAGNOSIS — E1121 Type 2 diabetes mellitus with diabetic nephropathy: Secondary | ICD-10-CM | POA: Diagnosis not present

## 2020-05-17 DIAGNOSIS — E782 Mixed hyperlipidemia: Secondary | ICD-10-CM | POA: Diagnosis not present

## 2020-05-17 DIAGNOSIS — N184 Chronic kidney disease, stage 4 (severe): Secondary | ICD-10-CM

## 2020-05-17 DIAGNOSIS — I4819 Other persistent atrial fibrillation: Secondary | ICD-10-CM

## 2020-05-17 DIAGNOSIS — N400 Enlarged prostate without lower urinary tract symptoms: Secondary | ICD-10-CM | POA: Diagnosis not present

## 2020-05-17 DIAGNOSIS — Z23 Encounter for immunization: Secondary | ICD-10-CM

## 2020-05-17 DIAGNOSIS — I10 Essential (primary) hypertension: Secondary | ICD-10-CM

## 2020-05-17 DIAGNOSIS — E559 Vitamin D deficiency, unspecified: Secondary | ICD-10-CM | POA: Insufficient documentation

## 2020-05-17 LAB — POCT UA - MICROALBUMIN
Creatinine, POC: 10 mg/dL
Microalbumin Ur, POC: 30 mg/L

## 2020-05-17 NOTE — Progress Notes (Signed)
Subjective:  Patient ID: Glen Lee, male    DOB: 07-26-1933  Age: 86 y.o. MRN: [id number]  Chief Complaint  Patient presents with  . Hypertension    follow up, pt is fasting, pt states here for blood work.    HPI: Chronic visit  Patient present with type 2 diabetes.  Specifically, this is type 2, noninsulin requiring diabetes, complicated by renal disease.  Compliance with treatment has been good; patient take medicines as directed, maintains diet and exercise regimen, follows up as directed, and is keeping glucose diary.  Date of  diagnosis 2010.  Depression screen has been performed.Tobacco screen nonsmoker. Current medicines for diabetes tradjenta.  Patient is on none for renal protection and sinvastatin for cholesterol control.  Patient performs foot exams daily and last ophthalmologic exam was one year  Patient presents for follow up of hypertension.  Patient tolerating diltiazem well with side effects.  Patient was diagnosed with hypertension 2010 so has been treated for hypertension for 10 years.Patient is working on maintaining diet and exercise regimen and follows up as directed. Complication include none.  Patient presents with hyperlipidemia.  Compliance with treatment has been good; patient takes medicines as directed, maintains low cholesterol diet, follows up as directed, and maintains exercise regimen.  Patient is using simvastatin without problems..  Patient has a diagnosis of permanent atrial fibrillation.   Patient is on none and has controlled ventricular response.  Patient is CV stable.  Current Outpatient Medications on File Prior to Visit  Medication Sig Dispense Refill  . acetaminophen (TYLENOL) 500 MG tablet Take 1-2 tablets (500-1,000 mg total) by mouth every 6 (six) hours as needed. 60 tablet 0  . diclofenac Sodium (VOLTAREN) 1 % GEL SMARTSIG:2 Gram(s) Topical 1 to 4 Times Daily    . diltiazem (CARDIZEM CD) 120 MG 24 hr capsule TAKE 1 CAPSULE BY MOUTH EVERY DAY  90 capsule 2  . ferrous sulfate 325 (65 FE) MG tablet Take 325 mg by mouth daily with breakfast.    . finasteride (PROSCAR) 5 MG tablet TAKE 1 TABLET BY MOUTH EVERY DAY 90 tablet 2  . furosemide (LASIX) 20 MG tablet Take 20 mg by mouth daily.    Marland Kitchen ketoconazole (NIZORAL) 2 % shampoo USE AS DIRECTED, ROTATE BETWEEN OVER THE COUNTER SHAMPOOS    . Mesalamine 800 MG TBEC TAKE 1 TABLET BY MOUTH TWICE A DAY 180 tablet 2  . pantoprazole (PROTONIX) 20 MG tablet TAKE 1 TABLET BY MOUTH EVERY DAY AS DIRECTED 90 tablet 3  . simvastatin (ZOCOR) 20 MG tablet TAKE 1 TABLET BY MOUTH EVERY DAY 90 tablet 2  . tamsulosin (FLOMAX) 0.4 MG CAPS capsule TAKE 1 CAPSULE BY MOUTH EVERY DAY 90 capsule 0  . TRADJENTA 5 MG TABS tablet Take 5 mg by mouth daily.    . traZODone (DESYREL) 100 MG tablet TAKE 1 TABLET BY MOUTH AT NIGHT 90 tablet 2  . triamcinolone cream (KENALOG) 0.1 % Apply 1 application topically 2 (two) times daily. 453.6 g 3   No current facility-administered medications on file prior to visit.   Past Medical History:  Diagnosis Date  . Anemia   . Arthritis   . Benign prostate hyperplasia   . Diabetes mellitus without complication ([place])   . Edema    right leg  . Enlarged prostate   . Esophageal reflux   . Essential hypertension   . Hyperlipidemia   . Hypertension   . Mixed hyperlipidemia   . Tremor  right hand  . Type 2 diabetes mellitus with other specified complication ([place])   . Vitamin D deficiency    Past Surgical History:  Procedure Laterality Date  . JOINT REPLACEMENT Left    knee  . KNEE SURGERY    . ORIF ANKLE FRACTURE Right 06/30/2016   Procedure: OPEN REDUCTION INTERNAL FIXATION (ORIF) ANKLE FRACTURE;  Surgeon: Altamese Sedan, MD;  Location: [place] [facility];  Service: Orthopedics;  Laterality: Right;    Family History  Problem Relation Age of Onset  . Hyperlipidemia Mother   . Diabetes Mother   . Colon cancer Father   . Diabetes Father    Social History   Socioeconomic History  .  Marital status: Single    Spouse name: Not on file  . Number of children: Not on file  . Years of education: Not on file  . Highest education level: Not on file  Occupational History  . Not on file  Tobacco Use  . Smoking status: Former Smoker    Years: 20.00    Types: Cigarettes    Quit date: 08/04/1992    Years since quitting: 27.8  . Smokeless tobacco: Never Used  Vaping Use  . Vaping Use: Never used  Substance and Sexual Activity  . Alcohol use: Not Currently    Alcohol/week: 0.0 standard drinks  . Drug use: Never  . Sexual activity: Not Currently  Other Topics Concern  . Not on file  Social History Narrative   Concern for pt ability to care for himself. Pt refused placement post hospitalization after rehab   Social Determinants of Health   Financial Resource Strain:   . Difficulty of Paying Living Expenses: Not on file  Food Insecurity:   . Worried About Charity fundraiser in the Last Year: Not on file  . Ran Out of Food in the Last Year: Not on file  Transportation Needs:   . Lack of Transportation (Medical): Not on file  . Lack of Transportation (Non-Medical): Not on file  Physical Activity:   . Days of Exercise per Week: Not on file  . Minutes of Exercise per Session: Not on file  Stress:   . Feeling of Stress : Not on file  Social Connections:   . Frequency of Communication with Friends and Family: Not on file  . Frequency of Social Gatherings with Friends and Family: Not on file  . Attends Religious Services: Not on file  . Active Member of Clubs or Organizations: Not on file  . Attends Archivist Meetings: Not on file  . Marital Status: Not on file    Review of Systems  Constitutional: Negative for activity change, appetite change and fever.  HENT: Positive for rhinorrhea. Negative for ear pain.   Eyes: Negative for visual disturbance.  Respiratory: Negative for apnea, cough, chest tightness and wheezing.   Cardiovascular: Negative for chest  pain, palpitations and leg swelling.  Gastrointestinal: Negative for abdominal distention, abdominal pain and nausea.  Endocrine: Positive for polyuria.  Genitourinary: Negative for difficulty urinating, dysuria and urgency.  Musculoskeletal: Positive for arthralgias (hip and knees). Negative for myalgias.  Skin: Negative.   Neurological: Negative for dizziness, facial asymmetry and headaches.  Psychiatric/Behavioral: Negative for agitation and confusion.     Objective:  BP 122/62   Pulse 86   Temp 97.6 F (36.4 C)   Resp 18   Ht 5' 9"  (1.753 m)   Wt 194 lb 12.8 oz (88.4 kg)   SpO2 100%   BMI  28.77 kg/m   BP/Weight 05/17/2020 02/09/2020 8/84/1660  Systolic BP 630 160 109  Diastolic BP 62 78 70  Wt. (Lbs) 194.8 187 191  BMI 28.77 27.62 27.41    Physical Exam Vitals reviewed.  Constitutional:      Appearance: He is normal weight.  HENT:     Right Ear: Tympanic membrane, ear canal and external ear normal.     Left Ear: Tympanic membrane and ear canal normal.     Nose: Nose normal.     Mouth/Throat:     Mouth: Mucous membranes are moist.     Pharynx: Oropharynx is clear.  Eyes:     Extraocular Movements: Extraocular movements intact.     Conjunctiva/sclera: Conjunctivae normal.     Pupils: Pupils are equal, round, and reactive to light.  Cardiovascular:     Rate and Rhythm: Normal rate and regular rhythm.     Pulses: Normal pulses.     Heart sounds: Normal heart sounds.  Pulmonary:     Effort: Pulmonary effort is normal.     Breath sounds: Normal breath sounds.  Abdominal:     General: Abdomen is flat. Bowel sounds are normal.  Musculoskeletal:        General: Normal range of motion.     Cervical back: Normal range of motion and neck supple.  Skin:    Findings: No bruising.  Neurological:     General: No focal deficit present.     Mental Status: He is alert and oriented to person, place, and time. Mental status is at baseline.     Comments: Tremor right hand    Psychiatric:        Mood and Affect: Mood normal.     Diabetic Foot Exam - Simple   Simple Foot Form Diabetic Foot exam was performed with the following findings: Yes 05/17/2020  9:21 AM  Visual Inspection See comments: Yes Sensation Testing Intact to touch and monofilament testing bilaterally: Yes Pulse Check Posterior Tibialis and Dorsalis pulse intact bilaterally: Yes Comments Bunion, edema,       Lab Results  Component Value Date   WBC 7.2 02/09/2020   HGB 10.0 (L) 02/09/2020   HCT 30.9 (L) 02/09/2020   PLT 187 02/09/2020   GLUCOSE 121 (H) 02/09/2020   CHOL 135 02/09/2020   TRIG 75 02/09/2020   HDL 63 02/09/2020   LDLCALC 57 02/09/2020   ALT 7 02/09/2020   AST 11 02/09/2020   NA 142 02/09/2020   K 4.3 02/09/2020   CL 104 02/09/2020   CREATININE 1.34 (H) 02/09/2020   BUN 22 02/09/2020   CO2 24 02/09/2020   TSH 0.713 08/05/2014   INR 3.22 (H) 08/04/2014   HGBA1C 6.6 (H) 02/09/2020   MICROALBUR 30 05/17/2020      Assessment & Plan:   1. Mixed hyperlipidemia - Lipid panel AN INDIVIDUAL CARE PLAN for hyperlipidemia/ cholesterol was established and reinforced today.  The patient's status was assessed using clinical findings on exam, lab and other diagnostic tests. The patient's disease status was assessed based on evidence-based guidelines and found to be well controlled. MEDICATIONS were reviewed. SELF MANAGEMENT GOALS have been discussed and patient's success at attaining the goal of low cholesterol was assessed. RECOMMENDATION given include regular exercise 3 days a week and low cholesterol/low fat diet. CLINICAL SUMMARY including written plan to identify barriers unique to the patient due to social or economic  reasons was discussed.  2. Diabetic glomerulopathy (HCC) - Comprehensive metabolic panel - Hemoglobin  A1c An individual care plan for diabetes was established and reinforced today.  The patient's status was assessed using clinical findings on exam,  labs and diagnostic testing. Patient success at meeting goals based on disease specific evidence-based guidelines and found to be good controlled. Medications were assessed and patient's understanding of the medical issues , including barriers were assessed. Recommend adherence to a diabetic diet, a graduated exercise program, HgbA1c level is checked quarterly, and urine microalbumin performed yearly .  Annual mono-filament sensation testing performed. Lower blood pressure and control hyperlipidemia is important. Get annual eye exams and annual flu shots and smoking cessation discussed.  Self management goals were discussed.  3. Persistent atrial fibrillation [facility]) Patient has a diagnosis of permanent atrial fibrillation.   Patient is on no anticoagulant and has controlled ventricular response.  Patient is CV stable.  4. Benign prostatic hyperplasia without lower urinary tract symptoms - PSA AN INDIVIDUAL CARE PLAN for BPH was established and reinforced today.  The patient's status was assessed using clinical findings on exam, labs, and other diagnostic testing. Patient's success at meeting treatment goals based on disease specific evidence-bassed guidelines and found to be in good control. RECOMMENDATIONS include maintaining present medicines and treatment.  5. Essential hypertension - CBC with Differential/Platelet An individual hypertension care plan was established and reinforced today.  The patient's status was assessed using clinical findings on exam and labs or diagnostic tests. The patient's success at meeting treatment goals on disease specific evidence-based guidelines and found to be well controlled. SELF MANAGEMENT: The patient and I together assessed ways to personally work towards obtaining the recommended goals. RECOMMENDATIONS: avoid decongestants found in common cold remedies, decrease consumption of alcohol, perform routine monitoring of BP with home BP cuff, exercise, reduction of  dietary salt, take medicines as prescribed, try not to miss doses and quit smoking.  Regular exercise and maintaining a healthy weight is needed.  Stress reduction may help. A CLINICAL SUMMARY including written plan identify barriers to care unique to individual due to social or financial issues.  We attempt to mutually creat solutions for individual and family understanding.  6. CKD (chronic kidney disease) stage 4, GFR 15-29 ml/min (HCC) AN INDIVIDUAL CARE PLAN for chronic renal disease was established and reinforced today.  The patient's status was assessed using clinical findings on exam, labs, and other diagnostic testing. Patient's success at meeting treatment goals based on disease specific evidence-bassed guidelines and found to be in fair control. RECOMMENDATIONS include maintaining present medicines and treatment.  7. Vitamin D deficiency - VITAMIN D 25 Hydroxy (Vit-D Deficiency, Fractures) AN INDIVIDUAL CARE PLAN for vitamin D deficiency was established and reinforced today.  The patient's status was assessed using clinical findings on exam, labs, and other diagnostic testing. Patient's success at meeting treatment goals based on disease specific evidence-bassed guidelines and found to be in good control. RECOMMENDATIONS include maintaining present medicines and treatment.      Orders Placed This Encounter  Procedures  . Flu Vaccine QUAD High Dose(Fluad)  . Pneumococcal polysaccharide vaccine 23-valent greater than or equal to 2yo subcutaneous/IM  . Comprehensive metabolic panel  . Hemoglobin A1c  . Lipid panel  . PSA  . CBC with Differential/Platelet  . VITAMIN D 25 Hydroxy (Vit-D Deficiency, Fractures)  . POCT UA - Microalbumin      I spent 30 minutes dedicated to the care of this patient on the date of this encounter to include face-to-face time with the patient, as well as:I reviewed old EMR  to update immunizations.  Follow-up: Return in about 4 months (around 09/15/2020)  for fasting.  An After Visit Summary was printed and given to the patient.  Reinaldo Meeker, MD [facility] [phone]

## 2020-05-18 LAB — COMPREHENSIVE METABOLIC PANEL
ALT: 10 IU/L (ref 0–44)
AST: 15 IU/L (ref 0–40)
Albumin/Globulin Ratio: 1.8 (ref 1.2–2.2)
Albumin: 4.3 g/dL (ref 3.6–4.6)
Alkaline Phosphatase: 135 IU/L — ABNORMAL HIGH (ref 44–121)
BUN/Creatinine Ratio: 22 (ref 10–24)
BUN: 32 mg/dL — ABNORMAL HIGH (ref 8–27)
Bilirubin Total: 0.4 mg/dL (ref 0.0–1.2)
CO2: 23 mmol/L (ref 20–29)
Calcium: 9.9 mg/dL (ref 8.6–10.2)
Chloride: 105 mmol/L (ref 96–106)
Creatinine, Ser: 1.46 mg/dL — ABNORMAL HIGH (ref 0.76–1.27)
GFR calc Af Amer: 50 mL/min/{1.73_m2} — ABNORMAL LOW (ref >59)
GFR calc non Af Amer: 44 mL/min/{1.73_m2} — ABNORMAL LOW (ref >59)
Globulin, Total: 2.4 g/dL (ref 1.5–4.5)
Glucose: 125 mg/dL — ABNORMAL HIGH (ref 65–99)
Potassium: 4.4 mmol/L (ref 3.5–5.2)
Sodium: 143 mmol/L (ref 134–144)
Total Protein: 6.7 g/dL (ref 6.0–8.5)

## 2020-05-18 LAB — CBC WITH DIFFERENTIAL/PLATELET
Basophils Absolute: 0.1 10*3/uL (ref 0.0–0.2)
Basos: 1 %
EOS (ABSOLUTE): 0.1 10*3/uL (ref 0.0–0.4)
Eos: 2 %
Hematocrit: 34 % — ABNORMAL LOW (ref 37.5–51.0)
Hemoglobin: 11 g/dL — ABNORMAL LOW (ref 13.0–17.7)
Immature Grans (Abs): 0 10*3/uL (ref 0.0–0.1)
Immature Granulocytes: 0 %
Lymphocytes Absolute: 1 10*3/uL (ref 0.7–3.1)
Lymphs: 17 %
MCH: 28.5 pg (ref 26.6–33.0)
MCHC: 32.4 g/dL (ref 31.5–35.7)
MCV: 88 fL (ref 79–97)
Monocytes Absolute: 0.5 10*3/uL (ref 0.1–0.9)
Monocytes: 8 %
Neutrophils Absolute: 4.3 10*3/uL (ref 1.4–7.0)
Neutrophils: 72 %
Platelets: 154 10*3/uL (ref 150–450)
RBC: 3.86 x10E6/uL — ABNORMAL LOW (ref 4.14–5.80)
RDW: 14.1 % (ref 11.6–15.4)
WBC: 5.9 10*3/uL (ref 3.4–10.8)

## 2020-05-18 LAB — LIPID PANEL
Chol/HDL Ratio: 2.1 ratio (ref 0.0–5.0)
Cholesterol, Total: 139 mg/dL (ref 100–199)
HDL: 66 mg/dL (ref >39)
LDL Chol Calc (NIH): 58 mg/dL (ref 0–99)
Triglycerides: 74 mg/dL (ref 0–149)
VLDL Cholesterol Cal: 15 mg/dL (ref 5–40)

## 2020-05-18 LAB — CARDIOVASCULAR RISK ASSESSMENT

## 2020-05-18 LAB — PSA: Prostate Specific Ag, Serum: 0.1 ng/mL (ref 0.0–4.0)

## 2020-05-18 LAB — HEMOGLOBIN A1C
Est. average glucose Bld gHb Est-mCnc: 151 mg/dL
Hgb A1c MFr Bld: 6.9 % — ABNORMAL HIGH (ref 4.8–5.6)

## 2020-05-18 LAB — VITAMIN D 25 HYDROXY (VIT D DEFICIENCY, FRACTURES): Vit D, 25-Hydroxy: 32 ng/mL (ref 30.0–100.0)

## 2020-05-18 NOTE — Progress Notes (Signed)
Glucose 1125,  kidney tests still stage 3a, liver tests normal, A1c 6.9 good, Cholesterol normal, PSA 0.1 normal, CBC mild anemia but improving, Vitamin D 32 in normal limits lp

## 2020-05-23 ENCOUNTER — Other Ambulatory Visit: Payer: Self-pay | Admitting: Legal Medicine

## 2020-05-25 ENCOUNTER — Other Ambulatory Visit: Payer: Self-pay | Admitting: Physician Assistant

## 2020-05-25 DIAGNOSIS — E1142 Type 2 diabetes mellitus with diabetic polyneuropathy: Secondary | ICD-10-CM

## 2020-07-08 ENCOUNTER — Telehealth: Payer: Self-pay

## 2020-07-08 NOTE — Telephone Encounter (Signed)
Transition Care Management Follow-up Telephone Call  Date of discharge and from where: 07/07/20 from [facility]  How have you been since you were released from the hospital? Spoke to pt niece and she stated that he is doing well and has not had any issues since he has been home.   Any questions or concerns? No  Items Reviewed:  Did the pt receive and understand the discharge instructions provided? Yes   Medications obtained and verified? Yes   Other? No   Any new allergies since your discharge? No   Dietary orders reviewed? n/a  Do you have support at home? Yes   Functional Questionnaire: (I = Independent and D = Dependent) ADLs: I  Bathing/Dressing- I  Meal Prep- I  Eating- I  Maintaining continence- I  Transferring/Ambulation- I  Managing Meds- I  Follow up appointments reviewed:   PCP Hospital f/u appt confirmed? Yes  Scheduled to see Reinaldo Meeker, MD on 07/12/20 @ 10:15am.  Are transportation arrangements needed? No   If their condition worsens, is the pt aware to call PCP or go to the Emergency Dept.? Yes  Was the patient provided with contact information for the PCP's office or ED? Yes  Was to pt encouraged to call back with questions or concerns? Yes

## 2020-07-12 ENCOUNTER — Ambulatory Visit: Payer: Medicare Other | Admitting: Legal Medicine

## 2020-07-12 ENCOUNTER — Encounter: Payer: Self-pay | Admitting: Legal Medicine

## 2020-07-12 ENCOUNTER — Other Ambulatory Visit: Payer: Self-pay

## 2020-07-12 VITALS — BP 126/74 | HR 75 | Temp 97.7°F | Resp 16 | Ht 69.0 in | Wt 207.0 lb

## 2020-07-12 DIAGNOSIS — W19XXXA Unspecified fall, initial encounter: Secondary | ICD-10-CM | POA: Diagnosis not present

## 2020-07-12 DIAGNOSIS — S0101XA Laceration without foreign body of scalp, initial encounter: Secondary | ICD-10-CM | POA: Insufficient documentation

## 2020-07-12 NOTE — Progress Notes (Signed)
Subjective:  Patient ID: Glen Lee, male    DOB: 08-08-1933  Age: 87 y.o. MRN: [id number]  Chief Complaint  Patient presents with  . Wound Check    Patient is here to check the wound on his scalp. Patient went to [facility] ED on 07/06/2020.    HPI: patient hit head on cabinet and lacerated scalp.  No LOC happened on 07/04/2020.  He was seen in ER and stapled shut.  It is healing wll   Current Outpatient Medications on File Prior to Visit  Medication Sig Dispense Refill  . acetaminophen (TYLENOL) 500 MG tablet Take 1-2 tablets (500-1,000 mg total) by mouth every 6 (six) hours as needed. 60 tablet 0  . diclofenac Sodium (VOLTAREN) 1 % GEL SMARTSIG:2 Gram(s) Topical 1 to 4 Times Daily    . diltiazem (CARDIZEM CD) 120 MG 24 hr capsule TAKE 1 CAPSULE BY MOUTH EVERY DAY 90 capsule 2  . doxycycline (VIBRA-TABS) 100 MG tablet Take 100 mg by mouth 2 (two) times daily.    . ferrous sulfate 325 (65 FE) MG tablet Take 325 mg by mouth daily with breakfast.    . finasteride (PROSCAR) 5 MG tablet TAKE 1 TABLET BY MOUTH EVERY DAY 90 tablet 2  . furosemide (LASIX) 20 MG tablet Take 20 mg by mouth daily.    Marland Kitchen ketoconazole (NIZORAL) 2 % shampoo USE AS DIRECTED, ROTATE BETWEEN OVER THE COUNTER SHAMPOOS    . Mesalamine 800 MG TBEC TAKE 1 TABLET BY MOUTH TWICE A DAY 180 tablet 2  . pantoprazole (PROTONIX) 20 MG tablet TAKE 1 TABLET BY MOUTH EVERY DAY AS DIRECTED 90 tablet 3  . simvastatin (ZOCOR) 20 MG tablet TAKE 1 TABLET BY MOUTH EVERY DAY 90 tablet 2  . tamsulosin (FLOMAX) 0.4 MG CAPS capsule TAKE 1 CAPSULE BY MOUTH EVERY DAY 90 capsule 2  . TRADJENTA 5 MG TABS tablet Take 5 mg by mouth daily.    . traZODone (DESYREL) 100 MG tablet TAKE 1 TABLET BY MOUTH AT NIGHT 90 tablet 2  . triamcinolone cream (KENALOG) 0.1 % Apply 1 application topically 2 (two) times daily. 453.6 g 3   No current facility-administered medications on file prior to visit.   Past Medical History:  Diagnosis Date  .  Anemia   . Arthritis   . Benign prostate hyperplasia   . Diabetes mellitus without complication ([place])   . Edema    right leg  . Enlarged prostate   . Esophageal reflux   . Essential hypertension   . Hyperlipidemia   . Hypertension   . Mixed hyperlipidemia   . Tremor    right hand  . Type 2 diabetes mellitus with other specified complication ([place])   . Vitamin D deficiency    Past Surgical History:  Procedure Laterality Date  . JOINT REPLACEMENT Left    knee  . KNEE SURGERY    . ORIF ANKLE FRACTURE Right 06/30/2016   Procedure: OPEN REDUCTION INTERNAL FIXATION (ORIF) ANKLE FRACTURE;  Surgeon: Altamese [place], MD;  Location: [place];  Service: Orthopedics;  Laterality: Right;    Family History  Problem Relation Age of Onset  . Hyperlipidemia Mother   . Diabetes Mother   . Colon cancer Father   . Diabetes Father    Social History   Socioeconomic History  . Marital status: Single    Spouse name: Not on file  . Number of children: Not on file  . Years of education: Not on file  .  Highest education level: Not on file  Occupational History  . Not on file  Tobacco Use  . Smoking status: Former Smoker    Years: 20.00    Types: Cigarettes    Quit date: 08/04/1992    Years since quitting: 27.9  . Smokeless tobacco: Never Used  Vaping Use  . Vaping Use: Never used  Substance and Sexual Activity  . Alcohol use: Not Currently    Alcohol/week: 0.0 standard drinks  . Drug use: Never  . Sexual activity: Not Currently  Other Topics Concern  . Not on file  Social History Narrative   Concern for pt ability to care for himself. Pt refused placement post hospitalization after rehab   Social Determinants of Health   Financial Resource Strain: Not on file  Food Insecurity: Not on file  Transportation Needs: Not on file  Physical Activity: Not on file  Stress: Not on file  Social Connections: Not on file    Review of Systems  Constitutional: Negative.   HENT: Negative.    Eyes: Negative.   Respiratory: Negative.   Cardiovascular: Negative for chest pain, palpitations and leg swelling.  Gastrointestinal: Negative for abdominal distention.  Genitourinary: Negative.   Musculoskeletal: Negative for arthralgias.  Skin: Positive for wound.  Neurological: Negative.   Psychiatric/Behavioral: Negative.      Objective:  BP 126/74   Pulse 75   Temp 97.7 F (36.5 C)   Resp 16   Ht 5' 9"  (1.753 m)   Wt 207 lb (93.9 kg)   SpO2 98%   BMI 30.57 kg/m   BP/Weight 07/12/2020 05/17/2020 7/00/1749  Systolic BP 449 675 916  Diastolic BP 74 62 78  Wt. (Lbs) 207 194.8 187  BMI 30.57 28.77 27.62    Physical Exam Vitals reviewed.  Constitutional:      Appearance: Normal appearance.  Cardiovascular:     Rate and Rhythm: Normal rate and regular rhythm.     Pulses: Normal pulses.     Heart sounds: Normal heart sounds. No murmur heard. No gallop.   Pulmonary:     Effort: Pulmonary effort is normal. No respiratory distress.     Breath sounds: No rales.  Skin:    General: Skin is warm.     Comments: Laceration scalp, 14 cm healing well  Neurological:     General: No focal deficit present.     Mental Status: He is alert and oriented to person, place, and time. Mental status is at baseline.       Lab Results  Component Value Date   WBC 5.9 05/17/2020   HGB 11.0 (L) 05/17/2020   HCT 34.0 (L) 05/17/2020   PLT 154 05/17/2020   GLUCOSE 125 (H) 05/17/2020   CHOL 139 05/17/2020   TRIG 74 05/17/2020   HDL 66 05/17/2020   LDLCALC 58 05/17/2020   ALT 10 05/17/2020   AST 15 05/17/2020   NA 143 05/17/2020   K 4.4 05/17/2020   CL 105 05/17/2020   CREATININE 1.46 (H) 05/17/2020   BUN 32 (H) 05/17/2020   CO2 23 05/17/2020   TSH 0.713 08/05/2014   INR 3.22 (H) 08/04/2014   HGBA1C 6.9 (H) 05/17/2020   MICROALBUR 30 05/17/2020      Assessment & Plan:   Diagnoses and all orders for this visit:  Laceration of scalp, initial encounter  Patient had fall  with complex scalp laceration.  It is healing well and will need one more week tmovalo heal before staple re  I spent 20 minutes dedicated to the care of this patient on the date of this encounter to include face-to-face time with the patient, as well as:   Follow-up: Return in about 1 week (around 07/19/2020).  An After Visit Summary was printed and given to the patient.  Reinaldo Meeker, MD [facility] [phone]

## 2020-07-19 ENCOUNTER — Ambulatory Visit: Payer: Medicare Other | Admitting: Legal Medicine

## 2020-07-19 ENCOUNTER — Other Ambulatory Visit: Payer: Self-pay

## 2020-07-19 ENCOUNTER — Encounter: Payer: Self-pay | Admitting: Legal Medicine

## 2020-07-19 VITALS — BP 142/70 | HR 68 | Temp 97.5°F | Resp 16 | Ht 69.0 in | Wt 208.0 lb

## 2020-07-19 DIAGNOSIS — S0101XD Laceration without foreign body of scalp, subsequent encounter: Secondary | ICD-10-CM

## 2020-07-19 DIAGNOSIS — W19XXXD Unspecified fall, subsequent encounter: Secondary | ICD-10-CM

## 2020-07-19 NOTE — Progress Notes (Signed)
Acute Office Visit  Subjective:    Patient ID: Glen Lee, male    DOB: 03-Oct-1933, 87 y.o.   MRN: [id number]  Chief Complaint  Patient presents with  . Suture / Staple Removal    HPI Patient is in today for laceration scalp with suture removal.  The laceration is healing well and 16 staples reoved  Past Medical History:  Diagnosis Date  . Anemia   . Arthritis   . Benign prostate hyperplasia   . Diabetes mellitus without complication ([place])   . Edema    right leg  . Enlarged prostate   . Esophageal reflux   . Essential hypertension   . Hyperlipidemia   . Hypertension   . Mixed hyperlipidemia   . Tremor    right hand  . Type 2 diabetes mellitus with other specified complication ([place])   . Vitamin D deficiency     Past Surgical History:  Procedure Laterality Date  . JOINT REPLACEMENT Left    knee  . KNEE SURGERY    . ORIF ANKLE FRACTURE Right 06/30/2016   Procedure: OPEN REDUCTION INTERNAL FIXATION (ORIF) ANKLE FRACTURE;  Surgeon: Altamese Mayfield, MD;  Location: [facility];  Service: Orthopedics;  Laterality: Right;    Family History  Problem Relation Age of Onset  . Hyperlipidemia Mother   . Diabetes Mother   . Colon cancer Father   . Diabetes Father     Social History   Socioeconomic History  . Marital status: Single    Spouse name: Not on file  . Number of children: Not on file  . Years of education: Not on file  . Highest education level: Not on file  Occupational History  . Not on file  Tobacco Use  . Smoking status: Former Smoker    Years: 20.00    Types: Cigarettes    Quit date: 08/04/1992    Years since quitting: 27.9  . Smokeless tobacco: Never Used  Vaping Use  . Vaping Use: Never used  Substance and Sexual Activity  . Alcohol use: Not Currently    Alcohol/week: 0.0 standard drinks  . Drug use: Never  . Sexual activity: Not Currently  Other Topics Concern  . Not on file  Social History Narrative   Concern for pt ability to care for  himself. Pt refused placement post hospitalization after rehab   Social Determinants of Health   Financial Resource Strain: Not on file  Food Insecurity: Not on file  Transportation Needs: Not on file  Physical Activity: Not on file  Stress: Not on file  Social Connections: Not on file  Intimate Partner Violence: Not on file    Outpatient Medications Prior to Visit  Medication Sig Dispense Refill  . acetaminophen (TYLENOL) 500 MG tablet Take 1-2 tablets (500-1,000 mg total) by mouth every 6 (six) hours as needed. 60 tablet 0  . diclofenac Sodium (VOLTAREN) 1 % GEL SMARTSIG:2 Gram(s) Topical 1 to 4 Times Daily    . diltiazem (CARDIZEM CD) 120 MG 24 hr capsule TAKE 1 CAPSULE BY MOUTH EVERY DAY 90 capsule 2  . ferrous sulfate 325 (65 FE) MG tablet Take 325 mg by mouth daily with breakfast.    . finasteride (PROSCAR) 5 MG tablet TAKE 1 TABLET BY MOUTH EVERY DAY 90 tablet 2  . furosemide (LASIX) 20 MG tablet Take 20 mg by mouth daily.    Marland Kitchen ketoconazole (NIZORAL) 2 % shampoo USE AS DIRECTED, ROTATE BETWEEN OVER THE COUNTER SHAMPOOS    .  Mesalamine 800 MG TBEC TAKE 1 TABLET BY MOUTH TWICE A DAY 180 tablet 2  . pantoprazole (PROTONIX) 20 MG tablet TAKE 1 TABLET BY MOUTH EVERY DAY AS DIRECTED 90 tablet 3  . simvastatin (ZOCOR) 20 MG tablet TAKE 1 TABLET BY MOUTH EVERY DAY 90 tablet 2  . tamsulosin (FLOMAX) 0.4 MG CAPS capsule TAKE 1 CAPSULE BY MOUTH EVERY DAY 90 capsule 2  . TRADJENTA 5 MG TABS tablet Take 5 mg by mouth daily.    . traZODone (DESYREL) 100 MG tablet TAKE 1 TABLET BY MOUTH AT NIGHT 90 tablet 2  . triamcinolone cream (KENALOG) 0.1 % Apply 1 application topically 2 (two) times daily. 453.6 g 3  . doxycycline (VIBRA-TABS) 100 MG tablet Take 100 mg by mouth 2 (two) times daily.     No facility-administered medications prior to visit.    Allergies  Allergen Reactions  . Sulfa Antibiotics Other (See Comments)  . Torsemide   . Keflet [Cephalexin] Rash    Review of Systems   Constitutional: Negative.   HENT: Negative.   Eyes: Negative.   Cardiovascular: Negative.   Gastrointestinal: Negative.   Genitourinary: Negative.   Musculoskeletal: Negative.  Negative for arthralgias and back pain.  Skin: Positive for wound (scalp).  Neurological: Negative.   Psychiatric/Behavioral: Negative.        Objective:    Physical Exam Vitals reviewed.  Constitutional:      Appearance: Normal appearance.  Cardiovascular:     Rate and Rhythm: Normal rate and regular rhythm.     Heart sounds: Normal heart sounds.  Pulmonary:     Effort: Pulmonary effort is normal.     Breath sounds: Normal breath sounds.  Musculoskeletal:     Cervical back: Normal range of motion and neck supple.  Skin:    Comments: 16 staples removed, no complications  Neurological:     Mental Status: He is alert.     BP (!) 142/70   Pulse 68   Temp (!) 97.5 F (36.4 C)   Resp 16   Ht 5' 9"  (1.753 m)   Wt 208 lb (94.3 kg)   SpO2 98%   BMI 30.72 kg/m  Wt Readings from Last 3 Encounters:  07/19/20 208 lb (94.3 kg)  07/12/20 207 lb (93.9 kg)  05/17/20 194 lb 12.8 oz (88.4 kg)    Health Maintenance Due  Topic Date Due  . OPHTHALMOLOGY EXAM  Never done    There are no preventive care reminders to display for this patient.   Lab Results  Component Value Date   TSH 0.713 08/05/2014   Lab Results  Component Value Date   WBC 5.9 05/17/2020   HGB 11.0 (L) 05/17/2020   HCT 34.0 (L) 05/17/2020   MCV 88 05/17/2020   PLT 154 05/17/2020   Lab Results  Component Value Date   NA 143 05/17/2020   K 4.4 05/17/2020   CO2 23 05/17/2020   GLUCOSE 125 (H) 05/17/2020   BUN 32 (H) 05/17/2020   CREATININE 1.46 (H) 05/17/2020   BILITOT 0.4 05/17/2020   ALKPHOS 135 (H) 05/17/2020   AST 15 05/17/2020   ALT 10 05/17/2020   PROT 6.7 05/17/2020   ALBUMIN 4.3 05/17/2020   CALCIUM 9.9 05/17/2020   ANIONGAP 8 07/03/2016   Lab Results  Component Value Date   CHOL 139 05/17/2020   Lab  Results  Component Value Date   HDL 66 05/17/2020   Lab Results  Component Value Date   LDLCALC 58  05/17/2020   Lab Results  Component Value Date   TRIG 74 05/17/2020   Lab Results  Component Value Date   CHOLHDL 2.1 05/17/2020   Lab Results  Component Value Date   HGBA1C 6.9 (H) 05/17/2020       Assessment & Plan:  1. Laceration of scalp, subsequent encounter Staples removed #39, no comolication 2. Fall, subsequent encounter Patient is using his walker and has not had further falls           Follow-up: No follow-ups on file.  An After Visit Summary was printed and given to the patient.  Reinaldo Meeker, MD [facility] [phone]

## 2020-08-06 ENCOUNTER — Other Ambulatory Visit: Payer: Self-pay | Admitting: Family Medicine

## 2020-08-06 ENCOUNTER — Other Ambulatory Visit: Payer: Self-pay | Admitting: Legal Medicine

## 2020-08-22 ENCOUNTER — Other Ambulatory Visit: Payer: Self-pay | Admitting: Legal Medicine

## 2020-08-22 DIAGNOSIS — I10 Essential (primary) hypertension: Secondary | ICD-10-CM

## 2020-08-31 ENCOUNTER — Other Ambulatory Visit: Payer: Self-pay | Admitting: Legal Medicine

## 2020-08-31 DIAGNOSIS — K5100 Ulcerative (chronic) pancolitis without complications: Secondary | ICD-10-CM

## 2020-09-20 ENCOUNTER — Ambulatory Visit: Payer: Medicare Other | Admitting: Legal Medicine

## 2020-09-30 ENCOUNTER — Ambulatory Visit: Payer: Medicare Other | Admitting: Legal Medicine

## 2020-10-04 ENCOUNTER — Ambulatory Visit: Payer: Medicare Other | Admitting: Podiatry

## 2020-10-05 ENCOUNTER — Other Ambulatory Visit: Payer: Self-pay

## 2020-10-05 ENCOUNTER — Telehealth: Payer: Self-pay | Admitting: *Deleted

## 2020-10-05 ENCOUNTER — Encounter: Payer: Self-pay | Admitting: Legal Medicine

## 2020-10-05 ENCOUNTER — Ambulatory Visit: Payer: Medicare Other | Admitting: Legal Medicine

## 2020-10-05 VITALS — BP 130/60 | HR 77 | Temp 97.6°F | Resp 17 | Ht 69.0 in | Wt 200.0 lb

## 2020-10-05 DIAGNOSIS — I4819 Other persistent atrial fibrillation: Secondary | ICD-10-CM | POA: Diagnosis not present

## 2020-10-05 DIAGNOSIS — D689 Coagulation defect, unspecified: Secondary | ICD-10-CM | POA: Insufficient documentation

## 2020-10-05 DIAGNOSIS — L03115 Cellulitis of right lower limb: Secondary | ICD-10-CM | POA: Insufficient documentation

## 2020-10-05 DIAGNOSIS — N184 Chronic kidney disease, stage 4 (severe): Secondary | ICD-10-CM

## 2020-10-05 DIAGNOSIS — E1121 Type 2 diabetes mellitus with diabetic nephropathy: Secondary | ICD-10-CM | POA: Diagnosis not present

## 2020-10-05 DIAGNOSIS — E782 Mixed hyperlipidemia: Secondary | ICD-10-CM

## 2020-10-05 DIAGNOSIS — I10 Essential (primary) hypertension: Secondary | ICD-10-CM

## 2020-10-05 DIAGNOSIS — E1151 Type 2 diabetes mellitus with diabetic peripheral angiopathy without gangrene: Secondary | ICD-10-CM

## 2020-10-05 MED ORDER — CLINDAMYCIN HCL 150 MG PO CAPS
150.0000 mg | ORAL_CAPSULE | Freq: Three times a day (TID) | ORAL | 0 refills | Status: DC
Start: 2020-10-05 — End: 2020-10-19

## 2020-10-05 NOTE — Progress Notes (Signed)
Subjective:  Patient ID: Glen Lee, male    DOB: 08-Aug-1933  Age: 87 y.o. MRN: [id number]  Chief Complaint  Patient presents with  . Leg Swelling    Both legs swelling    HPI: cellulitis right leg for 3 weeks getinng worse and draining.  He says he saw dermatology and Gave him a cream that did not help, the legs are weeping and cultured drainage.  Patient present with type 2 diabetes.  Specifically, this is type 2, noninsulin requiring diabetes, complicated by glomerulopathy.  Compliance with treatment has been good; patient take medicines as directed, maintains diet and exercise regimen, follows up as directed, and is keeping glucose diary.  Date of  diagnosis 2010.  Depression screen has been performed.Tobacco screen nonsmoker. Current medicines for diabetes trajenta.  Patient is on none for renal protection and simvastatin for cholesterol control.  Patient performs foot exams daily and last ophthalmologic exam was no.  Patient presents for follow up of hypertension.  Patient tolerating furosemide well with side effects.  Patient was diagnosed with hypertension 2010 so has been treated for hypertension for 10 years.Patient is working on maintaining diet and exercise regimen and follows up as directed. Complication include non.  Patient presents with hyperlipidemia.  Compliance with treatment has been good; patient takes medicines as directed, maintains low cholesterol diet, follows up as directed, and maintains exercise regimen.  Patient is using simvastatin without problems.   Current Outpatient Medications on File Prior to Visit  Medication Sig Dispense Refill  . acetaminophen (TYLENOL) 500 MG tablet Take 1-2 tablets (500-1,000 mg total) by mouth every 6 (six) hours as needed. 60 tablet 0  . diclofenac Sodium (VOLTAREN) 1 % GEL SMARTSIG:2 Gram(s) Topical 1 to 4 Times Daily    . diltiazem (CARDIZEM CD) 120 MG 24 hr capsule TAKE 1 CAPSULE BY MOUTH EVERY DAY 90 capsule 2  . ferrous  sulfate 325 (65 FE) MG tablet Take 325 mg by mouth daily with breakfast.    . finasteride (PROSCAR) 5 MG tablet TAKE 1 TABLET BY MOUTH EVERY DAY 90 tablet 2  . furosemide (LASIX) 20 MG tablet TAKE 1 AND 1/2 TABLET BY ORAL ROUTE 1 TIMES PER DAY 135 tablet 3  . ketoconazole (NIZORAL) 2 % shampoo USE AS DIRECTED, ROTATE BETWEEN OVER THE COUNTER SHAMPOOS    . Mesalamine 800 MG TBEC TAKE 1 TABLET BY MOUTH TWICE A DAY 180 tablet 2  . pantoprazole (PROTONIX) 20 MG tablet TAKE 1 TABLET BY MOUTH EVERY DAY AS DIRECTED 90 tablet 3  . simvastatin (ZOCOR) 20 MG tablet TAKE 1 TABLET BY MOUTH EVERY DAY 90 tablet 2  . tamsulosin (FLOMAX) 0.4 MG CAPS capsule TAKE 1 CAPSULE BY MOUTH EVERY DAY 90 capsule 2  . TRADJENTA 5 MG TABS tablet TAKE 1 TABLET BY MOUTH EVERY DAY 90 tablet 3  . traZODone (DESYREL) 100 MG tablet TAKE 1 TABLET BY MOUTH AT NIGHT 90 tablet 2  . triamcinolone cream (KENALOG) 0.1 % Apply 1 application topically 2 (two) times daily. 453.6 g 3   No current facility-administered medications on file prior to visit.   Past Medical History:  Diagnosis Date  . Anemia   . Arthritis   . Benign prostate hyperplasia   . Diabetes mellitus without complication ([name])   . Edema    right leg  . Enlarged prostate   . Esophageal reflux   . Essential hypertension   . Hyperlipidemia   . Hypertension   . Mixed hyperlipidemia   .  Tremor    right hand  . Type 2 diabetes mellitus with other specified complication ([name])   . Vitamin D deficiency    Past Surgical History:  Procedure Laterality Date  . JOINT REPLACEMENT Left    knee  . KNEE SURGERY    . ORIF ANKLE FRACTURE Right 06/30/2016   Procedure: OPEN REDUCTION INTERNAL FIXATION (ORIF) ANKLE FRACTURE;  Surgeon: Altamese Mammoth, MD;  Location: [facility];  Service: Orthopedics;  Laterality: Right;    Family History  Problem Relation Age of Onset  . Hyperlipidemia Mother   . Diabetes Mother   . Colon cancer Father   . Diabetes Father    Social History    Socioeconomic History  . Marital status: Single    Spouse name: Not on file  . Number of children: Not on file  . Years of education: Not on file  . Highest education level: Not on file  Occupational History  . Not on file  Tobacco Use  . Smoking status: Former Smoker    Years: 20.00    Types: Cigarettes    Quit date: 08/04/1992    Years since quitting: 28.1  . Smokeless tobacco: Never Used  Vaping Use  . Vaping Use: Never used  Substance and Sexual Activity  . Alcohol use: Not Currently    Alcohol/week: 0.0 standard drinks  . Drug use: Never  . Sexual activity: Not Currently  Other Topics Concern  . Not on file  Social History Narrative   Concern for pt ability to care for himself. Pt refused placement post hospitalization after rehab   Social Determinants of Health   Financial Resource Strain: Not on file  Food Insecurity: Not on file  Transportation Needs: Not on file  Physical Activity: Not on file  Stress: Not on file  Social Connections: Not on file    Review of Systems  Constitutional: Negative for activity change and appetite change.  HENT: Negative.   Eyes: Negative for itching.  Respiratory: Negative for cough and chest tightness.   Cardiovascular: Positive for leg swelling. Negative for chest pain and palpitations.  Gastrointestinal: Negative for abdominal distention and abdominal pain.  Genitourinary: Negative.   Musculoskeletal: Negative for arthralgias and back pain.  Skin: Positive for rash.       Cellulitis and stasis dermatitis right leg.  Neurological: Negative.      Objective:  BP 130/60   Pulse 77   Temp 97.6 F (36.4 C)   Resp 17   Ht 5' 9"  (1.753 m)   Wt 200 lb (90.7 kg)   SpO2 99%   BMI 29.53 kg/m   BP/Weight 10/05/2020 07/19/2020 09/23/2438  Systolic BP 102 725 366  Diastolic BP 60 70 74  Wt. (Lbs) 200 208 207  BMI 29.53 30.72 30.57    Physical Exam Vitals reviewed.  Constitutional:      Appearance: Normal appearance.   HENT:     Head: Normocephalic and atraumatic.     Right Ear: Tympanic membrane normal.     Left Ear: Tympanic membrane normal.     Mouth/Throat:     Mouth: Mucous membranes are moist.  Cardiovascular:     Rate and Rhythm: Normal rate and regular rhythm.     Pulses: Normal pulses.     Heart sounds: Normal heart sounds.  Pulmonary:     Effort: Pulmonary effort is normal.     Breath sounds: Normal breath sounds.  Musculoskeletal:        General: Normal range of  motion.  Skin:    General: Skin is warm.     Capillary Refill: Capillary refill takes less than 2 seconds.     Findings: Erythema present.     Comments: Right leg swollen and red with weeping present from knee to ankle.  Neurological:     General: No focal deficit present.     Mental Status: He is alert and oriented to person, place, and time.       Lab Results  Component Value Date   WBC 5.9 05/17/2020   HGB 11.0 (L) 05/17/2020   HCT 34.0 (L) 05/17/2020   PLT 154 05/17/2020   GLUCOSE 125 (H) 05/17/2020   CHOL 139 05/17/2020   TRIG 74 05/17/2020   HDL 66 05/17/2020   LDLCALC 58 05/17/2020   ALT 10 05/17/2020   AST 15 05/17/2020   NA 143 05/17/2020   K 4.4 05/17/2020   CL 105 05/17/2020   CREATININE 1.46 (H) 05/17/2020   BUN 32 (H) 05/17/2020   CO2 23 05/17/2020   TSH 0.713 08/05/2014   INR 3.22 (H) 08/04/2014   HGBA1C 6.9 (H) 05/17/2020   MICROALBUR 30 05/17/2020      Assessment & Plan:   Diagnoses and all orders for this visit: Coagulation defect [facility]) Patient is off all anticoagulants  Diabetic glomerulopathy ([place]) -     Hemoglobin A1c -     AMB Referral to [place] An individual care plan for diabetes with renal failure was established and reinforced today.  The patient's status was assessed using clinical findings on exam, labs and diagnostic testing. Patient success at meeting goals based on disease specific evidence-based guidelines and found to be good controlled. Renal  function stage 3b Medications were assessed and patient's understanding of the medical issues , including barriers were assessed. Recommend adherence to a diabetic diet, a graduated exercise program, HgbA1c level is checked quarterly, and urine microalbumin performed yearly .  Annual mono-filament sensation testing performed. Lower blood pressure and control hyperlipidemia is important. Get annual eye exams and annual flu shots and smoking cessation discussed.  Self management goals were discussed. CKD (chronic kidney disease) stage 3b eGFR 44 -     AMB Referral to [place] for renal failure was established and reinforced today.  The patient's status was assessed using clinical findings on exam, labs, and other diagnostic testing. Patient's success at meeting treatment goals based on disease specific evidence-bassed guidelines and found to be in fair control. RECOMMENDATIONS include maintaining present medicines and treatment.  Persistent atrial fibrillation (HCC) -     AMB Referral to [place] Patient has a diagnosis of permanent atrial fibrillation.   Patient is on no anticoagulation and has controlled ventricular response.  Patient is CV stable . Diabetes mellitus type 2 with peripheral artery disease ([place]) -     AMB Referral to [place] An individual care plan for diabetes was established and reinforced today.  The patient's status was assessed using clinical findings on exam, labs and diagnostic testing. Patient success at meeting goals based on disease specific evidence-based guidelines and found to be fair controlled. Medications were assessed and patient's understanding of the medical issues , including barriers were assessed. Recommend adherence to a diabetic diet, a graduated exercise program, HgbA1c level is checked quarterly, and urine microalbumin performed yearly .  Annual mono-filament sensation testing  performed. Lower blood pressure and control hyperlipidemia is important. Get annual eye exams and annual flu  shots and smoking cessation discussed.  Self management goals were discussed.  Essential hypertension  -     CBC with Differential/Platelet -     Comprehensive metabolic panel -     AMB Referral to [place] An individual hypertension care plan was established and reinforced today.  The patient's status was assessed using clinical findings on exam and labs or diagnostic tests. The patient's success at meeting treatment goals on disease specific evidence-based guidelines and found to be well controlled. SELF MANAGEMENT: The patient and I together assessed ways to personally work towards obtaining the recommended goals. RECOMMENDATIONS: avoid decongestants found in common cold remedies, decrease consumption of alcohol, perform routine monitoring of BP with home BP cuff, exercise, reduction of dietary salt, take medicines as prescribed, try not to miss doses and quit smoking.  Regular exercise and maintaining a healthy weight is needed.  Stress reduction may help. A CLINICAL SUMMARY including written plan identify barriers to care unique to individual due to social or financial issues.  We attempt to mutually creat solutions for individual and family understanding.  Mixed hyperlipidemia -     Lipid panel -     AMB Referral to [place] for hyperlipidemia/ cholesterol was established and reinforced today.  The patient's status was assessed using clinical findings on exam, lab and other diagnostic tests. The patient's disease status was assessed based on evidence-based guidelines and found to be fair controlled. MEDICATIONS were reviewed. SELF MANAGEMENT GOALS have been discussed and patient's success at attaining the goal of low cholesterol was assessed. RECOMMENDATION given include regular exercise 3 days a week and low cholesterol/low  fat diet. CLINICAL SUMMARY including written plan to identify barriers unique to the patient due to social or economic  reasons was discussed.  Cellulitis of right lower extremity -     clindamycin (CLEOCIN) 150 MG capsule; Take 1 capsule (150 mg total) by mouth 3 (three) times daily. -     WOUND CULTURE -     AMB Referral to [facility] Cellulitis of right leg Patient has chronic stasis edema right leg and now is infected with redness and weeping purulent material that was cultured.  Unna boot installed       Follow-up: Return in about 1 week (around 10/12/2020) for cellulitis.  An After Visit Summary was printed and given to the patient.  Reinaldo Meeker, MD [facility] [phone]

## 2020-10-05 NOTE — Chronic Care Management (AMB) (Addendum)
  Chronic Care Management   Note  10/05/2020 Name: Glen Lee MRN: [id number] DOB: Feb 03, 1934  Glen Lee is a 87 y.o. year old male who is a primary care patient of [name], MD. I reached out to Loraine Grip by phone today in response to a referral sent by Glen Lee's PCP, [name], MD.     Glen Lee was given information about Chronic Care Management services today including:  1. CCM service includes personalized support from designated clinical staff supervised by his physician, including individualized plan of care and coordination with other care providers 2. 24/7 contact phone numbers for assistance for urgent and routine care needs. 3. Service will only be billed when office clinical staff spend 20 minutes or more in a month to coordinate care. 4. Only one practitioner may furnish and bill the service in a calendar month. 5. The patient may stop CCM services at any time (effective at the end of the month) by phone call to the office staff. 6. The patient will be responsible for cost sharing (co-pay) of up to 20% of the service fee (after annual deductible is met).  Glen Lee verbally agreed to assistance and services provided by embedded care coordination/care management team today.  Follow up plan: Telephone appointment with care management team member scheduled for:10/12/2020  [place] [facility]

## 2020-10-06 LAB — CBC WITH DIFFERENTIAL/PLATELET
Basophils Absolute: 0.1 10*3/uL (ref 0.0–0.2)
Basos: 1 %
EOS (ABSOLUTE): 0.1 10*3/uL (ref 0.0–0.4)
Eos: 1 %
Hematocrit: 29.6 % — ABNORMAL LOW (ref 37.5–51.0)
Hemoglobin: 9.6 g/dL — ABNORMAL LOW (ref 13.0–17.7)
Immature Grans (Abs): 0 10*3/uL (ref 0.0–0.1)
Immature Granulocytes: 1 %
Lymphocytes Absolute: 0.7 10*3/uL (ref 0.7–3.1)
Lymphs: 13 %
MCH: 28.4 pg (ref 26.6–33.0)
MCHC: 32.4 g/dL (ref 31.5–35.7)
MCV: 88 fL (ref 79–97)
Monocytes Absolute: 0.5 10*3/uL (ref 0.1–0.9)
Monocytes: 8 %
Neutrophils Absolute: 4.2 10*3/uL (ref 1.4–7.0)
Neutrophils: 76 %
Platelets: 176 10*3/uL (ref 150–450)
RBC: 3.38 x10E6/uL — ABNORMAL LOW (ref 4.14–5.80)
RDW: 14.8 % (ref 11.6–15.4)
WBC: 5.5 10*3/uL (ref 3.4–10.8)

## 2020-10-06 LAB — COMPREHENSIVE METABOLIC PANEL
ALT: 11 IU/L (ref 0–44)
AST: 15 IU/L (ref 0–40)
Albumin/Globulin Ratio: 1.4 (ref 1.2–2.2)
Albumin: 4 g/dL (ref 3.6–4.6)
Alkaline Phosphatase: 125 IU/L — ABNORMAL HIGH (ref 44–121)
BUN/Creatinine Ratio: 18 (ref 10–24)
BUN: 27 mg/dL (ref 8–27)
Bilirubin Total: 0.6 mg/dL (ref 0.0–1.2)
CO2: 22 mmol/L (ref 20–29)
Calcium: 9.5 mg/dL (ref 8.6–10.2)
Chloride: 103 mmol/L (ref 96–106)
Creatinine, Ser: 1.47 mg/dL — ABNORMAL HIGH (ref 0.76–1.27)
Globulin, Total: 2.9 g/dL (ref 1.5–4.5)
Glucose: 117 mg/dL — ABNORMAL HIGH (ref 65–99)
Potassium: 4.2 mmol/L (ref 3.5–5.2)
Sodium: 140 mmol/L (ref 134–144)
Total Protein: 6.9 g/dL (ref 6.0–8.5)
eGFR: 47 mL/min/{1.73_m2} — ABNORMAL LOW (ref >59)

## 2020-10-06 LAB — HEMOGLOBIN A1C
Est. average glucose Bld gHb Est-mCnc: 154 mg/dL
Hgb A1c MFr Bld: 7 % — ABNORMAL HIGH (ref 4.8–5.6)

## 2020-10-06 LAB — LIPID PANEL
Chol/HDL Ratio: 2.1 ratio (ref 0.0–5.0)
Cholesterol, Total: 123 mg/dL (ref 100–199)
HDL: 58 mg/dL (ref >39)
LDL Chol Calc (NIH): 50 mg/dL (ref 0–99)
Triglycerides: 72 mg/dL (ref 0–149)
VLDL Cholesterol Cal: 15 mg/dL (ref 5–40)

## 2020-10-06 LAB — CARDIOVASCULAR RISK ASSESSMENT

## 2020-10-06 NOTE — Progress Notes (Signed)
Hemoglobin and hematocrit lower than last time, continue iron, glucose 117, kidney tests 3a level- stable, liver tests normal, A1c 7.0 good, cholesterol normal lp

## 2020-10-10 ENCOUNTER — Other Ambulatory Visit: Payer: Self-pay | Admitting: Legal Medicine

## 2020-10-10 LAB — WOUND CULTURE

## 2020-10-10 NOTE — Progress Notes (Signed)
Staph intermedius, continue clindamycin lp

## 2020-10-12 ENCOUNTER — Other Ambulatory Visit: Payer: Self-pay

## 2020-10-12 ENCOUNTER — Encounter: Payer: Self-pay | Admitting: Physician Assistant

## 2020-10-12 ENCOUNTER — Ambulatory Visit (INDEPENDENT_AMBULATORY_CARE_PROVIDER_SITE_OTHER): Payer: Medicare Other

## 2020-10-12 ENCOUNTER — Ambulatory Visit: Payer: Medicare Other | Admitting: Legal Medicine

## 2020-10-12 ENCOUNTER — Ambulatory Visit: Payer: Medicare Other | Admitting: Physician Assistant

## 2020-10-12 VITALS — BP 126/74 | HR 68 | Temp 97.2°F | Ht 69.0 in | Wt 196.0 lb

## 2020-10-12 DIAGNOSIS — R899 Unspecified abnormal finding in specimens from other organs, systems and tissues: Secondary | ICD-10-CM

## 2020-10-12 DIAGNOSIS — L03116 Cellulitis of left lower limb: Secondary | ICD-10-CM

## 2020-10-12 DIAGNOSIS — L03115 Cellulitis of right lower limb: Secondary | ICD-10-CM | POA: Diagnosis not present

## 2020-10-12 NOTE — Chronic Care Management (AMB) (Signed)
   10/12/2020  Glen Lee 1934/05/27 [id number]   3:30 pm schedule initial evaluation- contact person Sharen Counter   [phone]  Placed call to niece and reviewed reason for call. Niece reports she knows what the patient needs to be doing and that I would be better off to talk to the patient. Identified barrier of patient being very hard of hearing. Niece reports patient does much better with face to face visits.    Noted that patient has another MD appointment next Tuesday ( 10/19/2020)  at 1030am.  Offered to meet patient at MD office to complete assessment and offer interventions to improve health.  Placed call to MD office and left a message for office manager to see if this is possible to meet with patient and niece at 9:15am.  Awaiting a call back with approval to meet patient at MD office.    10/14/2020  Received voicemail from [place] with approval to see patient in the office on Tuesday 10/19/2020 at 915. 10/15/2020  Placed call to niece and confirmed I am able to meet patient at MD office on Tuesday 10/19/20 at 915.  No further questions  Tomasa Rand, RN, BSN, CEN RN Case Freight forwarder [facility] [phone]

## 2020-10-12 NOTE — Progress Notes (Signed)
Subjective:  Patient ID: Glen Lee, male    DOB: July 19, 1933  Age: 87 y.o. MRN: [id number]  Chief Complaint  Patient presents with  . Cellulitis    HPI  pt here for recheck of his legs - has history of chronic stasis dermatitis but last week was worse than usual and was given antibiotics (wound culture grew out staph) and had right leg wrapped in unna boot Patient states that his leg does look better and not weeping but still having some swelling Niece is concerned about right leg also which has little more swelling than usual He denies chest pain/sob  Also labwork at last visit showed low hgb - recommend to recheck Current Outpatient Medications on File Prior to Visit  Medication Sig Dispense Refill  . acetaminophen (TYLENOL) 500 MG tablet Take 1-2 tablets (500-1,000 mg total) by mouth every 6 (six) hours as needed. 60 tablet 0  . clindamycin (CLEOCIN) 150 MG capsule Take 1 capsule (150 mg total) by mouth 3 (three) times daily. 30 capsule 0  . diclofenac Sodium (VOLTAREN) 1 % GEL SMARTSIG:2 Gram(s) Topical 1 to 4 Times Daily    . diltiazem (CARDIZEM CD) 120 MG 24 hr capsule TAKE 1 CAPSULE BY MOUTH EVERY DAY 90 capsule 2  . ferrous sulfate 325 (65 FE) MG tablet Take 325 mg by mouth daily with breakfast.    . finasteride (PROSCAR) 5 MG tablet TAKE 1 TABLET BY MOUTH EVERY DAY 90 tablet 2  . furosemide (LASIX) 20 MG tablet TAKE 1 AND 1/2 TABLET BY ORAL ROUTE 1 TIMES PER DAY 135 tablet 3  . ketoconazole (NIZORAL) 2 % shampoo USE AS DIRECTED, ROTATE BETWEEN OVER THE COUNTER SHAMPOOS    . Mesalamine 800 MG TBEC TAKE 1 TABLET BY MOUTH TWICE A DAY 180 tablet 2  . pantoprazole (PROTONIX) 20 MG tablet TAKE 1 TABLET BY MOUTH EVERY DAY AS DIRECTED 90 tablet 3  . simvastatin (ZOCOR) 20 MG tablet TAKE 1 TABLET BY MOUTH EVERY DAY 90 tablet 2  . tamsulosin (FLOMAX) 0.4 MG CAPS capsule TAKE 1 CAPSULE BY MOUTH EVERY DAY 90 capsule 2  . TRADJENTA 5 MG TABS tablet TAKE 1 TABLET BY MOUTH EVERY DAY 90  tablet 3  . traZODone (DESYREL) 100 MG tablet TAKE 1 TABLET BY MOUTH AT NIGHT 90 tablet 2  . triamcinolone cream (KENALOG) 0.1 % Apply 1 application topically 2 (two) times daily. 453.6 g 3   No current facility-administered medications on file prior to visit.   Past Medical History:  Diagnosis Date  . Anemia   . Arthritis   . Benign prostate hyperplasia   . Diabetes mellitus without complication ([place])   . Edema    right leg  . Enlarged prostate   . Esophageal reflux   . Essential hypertension   . Hyperlipidemia   . Hypertension   . Mixed hyperlipidemia   . Tremor    right hand  . Type 2 diabetes mellitus with other specified complication ([place])   . Vitamin D deficiency    Past Surgical History:  Procedure Laterality Date  . JOINT REPLACEMENT Left    knee  . KNEE SURGERY    . ORIF ANKLE FRACTURE Right 06/30/2016   Procedure: OPEN REDUCTION INTERNAL FIXATION (ORIF) ANKLE FRACTURE;  Surgeon: Altamese [place], MD;  Location: [facility];  Service: Orthopedics;  Laterality: Right;    Family History  Problem Relation Age of Onset  . Hyperlipidemia Mother   . Diabetes Mother   . Colon  cancer Father   . Diabetes Father    Social History   Socioeconomic History  . Marital status: Single    Spouse name: Not on file  . Number of children: Not on file  . Years of education: Not on file  . Highest education level: Not on file  Occupational History  . Not on file  Tobacco Use  . Smoking status: Former Smoker    Years: 20.00    Types: Cigarettes    Quit date: 08/04/1992    Years since quitting: 28.2  . Smokeless tobacco: Never Used  Vaping Use  . Vaping Use: Never used  Substance and Sexual Activity  . Alcohol use: Not Currently    Alcohol/week: 0.0 standard drinks  . Drug use: Never  . Sexual activity: Not Currently  Other Topics Concern  . Not on file  Social History Narrative   Concern for pt ability to care for himself. Pt refused placement post hospitalization after  rehab   Social Determinants of Health   Financial Resource Strain: Not on file  Food Insecurity: Not on file  Transportation Needs: Not on file  Physical Activity: Not on file  Stress: Not on file  Social Connections: Not on file    Review of Systems  CONSTITUTIONAL: Negative for chills, fatigue, fever, unintentional weight gain and unintentional weight loss.  CARDIOVASCULAR: see HPI RESPIRATORY: Negative for recent cough and dyspnea.   INTEGUMENTARY: see HPI        Objective:  BP 126/74 (BP Location: Left Arm, Patient Position: Sitting, Cuff Size: Normal)   Pulse 68   Temp (!) 97.2 F (36.2 C) (Temporal)   Ht 5' 9"  (1.753 m)   Wt 196 lb (88.9 kg)   SpO2 98%   BMI 28.94 kg/m   BP/Weight 10/12/2020 0/35/0093 01/10/8298  Systolic BP 371 696 789  Diastolic BP 74 60 70  Wt. (Lbs) 196 200 208  BMI 28.94 29.53 30.72    Physical Exam CONSTITUTIONAL: Negative for chills, fatigue, fever, unintentional weight gain and unintentional weight loss.  CARDIOVASCULAR: Negative for chest pain, dizziness, palpitations  Ext - both lower legs with edema noted - no weeping however skin fragile RESPIRATORY: Negative for recent cough and dyspnea.  Diabetic Foot Exam - Simple   No data filed      Lab Results  Component Value Date   WBC 5.5 10/05/2020   HGB 9.6 (L) 10/05/2020   HCT 29.6 (L) 10/05/2020   PLT 176 10/05/2020   GLUCOSE 117 (H) 10/05/2020   CHOL 123 10/05/2020   TRIG 72 10/05/2020   HDL 58 10/05/2020   LDLCALC 50 10/05/2020   ALT 11 10/05/2020   AST 15 10/05/2020   NA 140 10/05/2020   K 4.2 10/05/2020   CL 103 10/05/2020   CREATININE 1.47 (H) 10/05/2020   BUN 27 10/05/2020   CO2 22 10/05/2020   TSH 0.713 08/05/2014   INR 3.22 (H) 08/04/2014   HGBA1C 7.0 (H) 10/05/2020   MICROALBUR 30 05/17/2020      Assessment & Plan:   1. Cellulitis of right lower extremity Continue antibiotics and unna boot applied 2. Cellulitis of left lower leg As above --- unna  boot applied 3. Abnormal laboratory test - CBC with Differential/Platelet    No orders of the defined types were placed in this encounter.   Orders Placed This Encounter  Procedures  . CBC with Differential/Platelet     Follow-up: Return in about 1 week (around 10/19/2020) for with Dr Henrene Pastor.  An After Visit Summary was printed and given to the patient.  Yetta Flock [facility] [phone]

## 2020-10-13 LAB — CBC WITH DIFFERENTIAL/PLATELET
Basophils Absolute: 0 10*3/uL (ref 0.0–0.2)
Basos: 1 %
EOS (ABSOLUTE): 0.1 10*3/uL (ref 0.0–0.4)
Eos: 1 %
Hematocrit: 29.7 % — ABNORMAL LOW (ref 37.5–51.0)
Hemoglobin: 9.4 g/dL — ABNORMAL LOW (ref 13.0–17.7)
Immature Grans (Abs): 0.1 10*3/uL (ref 0.0–0.1)
Immature Granulocytes: 1 %
Lymphocytes Absolute: 0.8 10*3/uL (ref 0.7–3.1)
Lymphs: 15 %
MCH: 27.6 pg (ref 26.6–33.0)
MCHC: 31.6 g/dL (ref 31.5–35.7)
MCV: 87 fL (ref 79–97)
Monocytes Absolute: 0.4 10*3/uL (ref 0.1–0.9)
Monocytes: 8 %
Neutrophils Absolute: 4 10*3/uL (ref 1.4–7.0)
Neutrophils: 74 %
Platelets: 202 10*3/uL (ref 150–450)
RBC: 3.4 x10E6/uL — ABNORMAL LOW (ref 4.14–5.80)
RDW: 15.1 % (ref 11.6–15.4)
WBC: 5.4 10*3/uL (ref 3.4–10.8)

## 2020-10-13 NOTE — Progress Notes (Signed)
Low hemoglobin and hematocrit is stable, minimal changes

## 2020-10-14 ENCOUNTER — Ambulatory Visit: Payer: Medicare Other | Admitting: Podiatry

## 2020-10-14 ENCOUNTER — Encounter: Payer: Self-pay | Admitting: Podiatry

## 2020-10-14 ENCOUNTER — Other Ambulatory Visit: Payer: Self-pay

## 2020-10-14 DIAGNOSIS — E1151 Type 2 diabetes mellitus with diabetic peripheral angiopathy without gangrene: Secondary | ICD-10-CM

## 2020-10-14 DIAGNOSIS — B351 Tinea unguium: Secondary | ICD-10-CM

## 2020-10-14 DIAGNOSIS — M2041 Other hammer toe(s) (acquired), right foot: Secondary | ICD-10-CM | POA: Diagnosis not present

## 2020-10-14 DIAGNOSIS — M2042 Other hammer toe(s) (acquired), left foot: Secondary | ICD-10-CM | POA: Diagnosis not present

## 2020-10-14 NOTE — Progress Notes (Signed)
  Subjective:  Patient ID: Glen Lee, male    DOB: 02-22-1934,  MRN: [id number]  Chief Complaint  Patient presents with  . Foot Ulcer    The 2nd and 3rd toes on the left are sore and tender and there is not any draining and I have unna boots on both of my legs and Dr Henrene Pastor is taken care of that    DOS: 07/21/19 Procedure: Flexor tenotomy Left 2nd/3rd toes  87 y.o. male presents with the above complaint. History confirmed with patient.   Objective:  Physical Exam: tenderness at the surgical site and no edema noted. Incision: healing well. Toes slight contracture, rigid, but callus facing out rather than down Assessment:   1. Hammer toes of both feet   2. Diabetes mellitus type 2 with peripheral artery disease ([place])   3. Onychomycosis     Plan:  Patient was evaluated and treated and all questions answered.  Post-operative State -Still with some bony contracture -Dispensed silicone toe caps -Should issues persist would consider surgical intervention consisting of arthroplasty.  Onyhcomycosis, DM PAD -Nails debrided x10  Return in about 6 weeks (around 11/25/2020).

## 2020-10-19 ENCOUNTER — Other Ambulatory Visit: Payer: Self-pay | Admitting: Legal Medicine

## 2020-10-19 ENCOUNTER — Encounter: Payer: Self-pay | Admitting: Legal Medicine

## 2020-10-19 ENCOUNTER — Ambulatory Visit: Payer: Medicare Other

## 2020-10-19 ENCOUNTER — Ambulatory Visit (INDEPENDENT_AMBULATORY_CARE_PROVIDER_SITE_OTHER): Payer: Medicare Other | Admitting: Legal Medicine

## 2020-10-19 VITALS — BP 112/66 | HR 72 | Temp 98.6°F | Resp 16 | Ht 69.0 in | Wt 196.0 lb

## 2020-10-19 DIAGNOSIS — L03116 Cellulitis of left lower limb: Secondary | ICD-10-CM

## 2020-10-19 DIAGNOSIS — E1151 Type 2 diabetes mellitus with diabetic peripheral angiopathy without gangrene: Secondary | ICD-10-CM

## 2020-10-19 DIAGNOSIS — L03115 Cellulitis of right lower limb: Secondary | ICD-10-CM | POA: Diagnosis not present

## 2020-10-19 DIAGNOSIS — I872 Venous insufficiency (chronic) (peripheral): Secondary | ICD-10-CM

## 2020-10-19 DIAGNOSIS — D649 Anemia, unspecified: Secondary | ICD-10-CM | POA: Diagnosis not present

## 2020-10-19 MED ORDER — ONETOUCH VERIO VI STRP: ORAL_STRIP | 12 refills | Status: DC

## 2020-10-19 MED ORDER — ONETOUCH ULTRASOFT LANCETS MISC: 12 refills | Status: DC

## 2020-10-19 MED ORDER — ONETOUCH VERIO FLEX SYSTEM W/DEVICE KIT: 1.0000 | PACK | Freq: Every day | 0 refills | Status: DC

## 2020-10-19 NOTE — Chronic Care Management (AMB) (Signed)
Chronic Care Management   CCM RN Visit Note  10/19/2020 Name: Glen Lee MRN: [id number] DOB: 1934-02-09  Subjective: LADALE Lee is a 87 y.o. year old male who is a primary care patient of [name], MD. The care management team was consulted for assistance with disease management and care coordination needs.    Engaged with patient face to face for initial visit in response to provider referral for case management and/or care coordination services.   Consent to Services:  The patient was given the following information about Chronic Care Management services today, agreed to services, and gave verbal consent: 1. CCM service includes personalized support from designated clinical staff supervised by the primary care provider, including individualized plan of care and coordination with other care providers 2. 24/7 contact phone numbers for assistance for urgent and routine care needs. 3. Service will only be billed when office clinical staff spend 20 minutes or more in a month to coordinate care. 4. Only one practitioner may furnish and bill the service in a calendar month. 5.The patient may stop CCM services at any time (effective at the end of the month) by phone call to the office staff. 6. The patient will be responsible for cost sharing (co-pay) of up to 20% of the service fee (after annual deductible is met). Patient agreed to services and consent obtained.  Patient agreed to services and verbal consent obtained.   Assessment: Review of patient past medical history, allergies, medications, health status, including review of consultants reports, laboratory and other test data, was performed as part of comprehensive evaluation and provision of chronic care management services.   SDOH (Social Determinants of Health) assessments and interventions performed:    CCM Care Plan  Allergies  Allergen Reactions  . Sulfa Antibiotics Other (See Comments)  . Torsemide   . Keflet  [Cephalexin] Rash    Outpatient Encounter Medications as of 10/19/2020  Medication Sig  . diclofenac Sodium (VOLTAREN) 1 % GEL SMARTSIG:2 Gram(s) Topical 1 to 4 Times Daily  . diltiazem (CARDIZEM CD) 120 MG 24 hr capsule TAKE 1 CAPSULE BY MOUTH EVERY DAY  . ferrous sulfate 325 (65 FE) MG tablet Take 325 mg by mouth daily with breakfast.  . finasteride (PROSCAR) 5 MG tablet TAKE 1 TABLET BY MOUTH EVERY DAY  . ketoconazole (NIZORAL) 2 % shampoo USE AS DIRECTED, ROTATE BETWEEN OVER THE COUNTER SHAMPOOS  . Mesalamine 800 MG TBEC TAKE 1 TABLET BY MOUTH TWICE A DAY  . pantoprazole (PROTONIX) 20 MG tablet TAKE 1 TABLET BY MOUTH EVERY DAY AS DIRECTED  . simvastatin (ZOCOR) 20 MG tablet TAKE 1 TABLET BY MOUTH EVERY DAY  . tamsulosin (FLOMAX) 0.4 MG CAPS capsule TAKE 1 CAPSULE BY MOUTH EVERY DAY  . TRADJENTA 5 MG TABS tablet TAKE 1 TABLET BY MOUTH EVERY DAY  . triamcinolone cream (KENALOG) 0.1 % Apply 1 application topically 2 (two) times daily.  Marland Kitchen acetaminophen (TYLENOL) 500 MG tablet Take 1-2 tablets (500-1,000 mg total) by mouth every 6 (six) hours as needed.  . furosemide (LASIX) 20 MG tablet TAKE 1 AND 1/2 TABLET BY ORAL ROUTE 1 TIMES PER DAY (Patient taking differently: Takes 20 mg once a day.)  . traZODone (DESYREL) 100 MG tablet TAKE 1 TABLET BY MOUTH AT NIGHT  . [DISCONTINUED] clindamycin (CLEOCIN) 150 MG capsule Take 1 capsule (150 mg total) by mouth 3 (three) times daily.   No facility-administered encounter medications on file as of 10/19/2020.    Patient Active Problem  List   Diagnosis Date Noted  . Abnormal laboratory test 10/12/2020  . Cellulitis of right lower extremity 10/12/2020  . Cellulitis of left lower leg 10/12/2020  . Coagulation defect ([place]) 10/05/2020  . Cellulitis of right leg 10/05/2020  . Laceration of scalp 07/12/2020  . Vitamin D deficiency   . BMI 28.0-28.9,adult 10/16/2019  . Diabetic glomerulopathy ([place]) 09/12/2019  . Mixed hyperlipidemia   . Essential  hypertension   . Diabetes mellitus type 2 with peripheral artery disease ([place])   . Benign prostate hyperplasia   . Hammer toes of both feet 03/21/2017  . Right ankle joint deformity 03/21/2017  . Ankle fracture 06/29/2016  . Varicose veins of lower extremities with complications 40/03/2724  . Chronic venous insufficiency 05/14/2015  . PAF (paroxysmal atrial fibrillation) ([place]) 09/16/2014  . Persistent atrial fibrillation ([place])   . Hypotension 08/04/2014  . CKD (chronic kidney disease) stage 4, GFR 15-29 ml/min ([facility]) 08/04/2014    Conditions to be addressed/monitored:DMII and cellulitis  Care Plan : Cellulitis- ineffective management of medication to help reduce fluid overload in lower extremities  Updates made by Thana Ates, RN since 10/19/2020 12:00 AM    Problem: Medication Adherence ([facility])     Long-Range Goal: Medication Adherence Maintained   Start Date: 10/19/2020  Expected End Date: 01/18/2021  This Visit's Progress: On track  Priority: High  Note:    Current Barriers:   Ineffective Self Health Maintenance- Patient is not taking medications as prescribed.  Unable to self administer medications as prescribed Clinical Goal(s):  Marland Kitchen Collaboration with [name], MD regarding development and update of comprehensive plan of care as evidenced by provider attestation and co-signature . Inter-disciplinary care team collaboration (see longitudinal plan of care)  patient will work with care management team to address care coordination and chronic disease management needs related to Medication Management and Education   Interventions:   Evaluation of current treatment plan related to DMII, fluid overload and self-management and patient's adherence to plan as established by provider.  Collaboration with [name], MD regarding development and update of comprehensive plan of care as evidenced by provider attestation       and  co-signature  Inter-disciplinary care team collaboration (see longitudinal plan of care)  Discussed plans with patient for ongoing care management follow up and provided patient with direct contact information for care management team  Reviewed with patient the reason for taking Lasix and the importance of not skipping or self reducing medications.  Reviewed signs and symptoms of infection and encouraged patient to call MD office at the onset of increased swelling, pain, redness or drainage.  Self Care Activities:  . Patient verbalizes understanding of plan to take Lasix as prescribed . Self administers medications as prescribed using the aid of a pill box.  . Attends all scheduled provider appointments . Calls provider office for new concerns or questions Patient Goals: - continue to use a pill box to sort medication and patient will add another 1/2 tablet of lasix to my daily pill planner - Take my medications as prescribed.  Follow Up Plan: Telephone follow up appointment with care management team member scheduled for: 11/23/2020     Long-Range Goal: Monitor and Manage My Blood Sugar-Diabetes Type 2   Start Date: 10/19/2020  This Visit's Progress: On track  Priority: Medium  Note:   Timeframe:  Long-Range Goal Priority:  Medium Start Date:      10/19/2020  Expected End Date:       01/18/2021                Follow Up Date 11/23/2020    Objective:  Lab Results  Component Value Date   HGBA1C 7.0 (H) 10/05/2020 .   Lab Results  Component Value Date   CREATININE 1.47 (H) 10/05/2020   CREATININE 1.46 (H) 05/17/2020   CREATININE 1.34 (H) 02/09/2020 .   Lab Results  Component Value Date   EGFR 47 (L) 10/05/2020   Current Barriers:  . Does not have glucometer to monitor blood sugar- patient reports no meter or supplies . Does not adhere to provider recommendations re:  self monitor weekly . Does not adhere to prescribed medication regimen- take lasix as  prescribed Case Manager Clinical Goal(s):  . patient will demonstrate improved adherence to prescribed treatment plan for diabetes self care/management as evidenced by: adherence to prescribed medication regimen contacting provider for new or worsened symptoms or questions.  . Encouraged patient to report any new signs of infection to MD immediately. . Interventions:  . Collaboration with [name], MD regarding development and update of comprehensive plan of care as evidenced by provider attestation and co-signature . Inter-disciplinary care team collaboration (see longitudinal plan of care) . Reviewed medications with patient and discussed importance of medication adherence . Requested MD send RX to local pharmacy. . Reviewed with patient how to use meter and he denied any questions. Self-Care Activities - Checks blood sugars as prescribed and utilize hyper and hypoglycemia protocol as needed Patient Goals: - check blood sugar at prescribed times - take the blood sugar log to all doctor visits - take the blood sugar meter to all doctor visits   Follow Up Plan: Telephone follow up appointment with care management team member scheduled for: 11/23/2020         Plan:Telephone follow up appointment with care management team member scheduled for:  11/23/2020 Tomasa Rand, RN, BSN, CEN RN Case Manager [facility] [phone]

## 2020-10-19 NOTE — Chronic Care Management (AMB) (Deleted)
Chronic Care Management   CCM RN Visit Note  10/19/2020 Name: Glen Lee MRN: [id number] DOB: 1933-07-23  Subjective: Glen Lee is a 87 y.o. year old male who is a primary care patient of Glen Anes, MD. The care management team was consulted for assistance with disease management and care coordination needs.    Engaged with patient face to face for initial visit in response to provider referral for case management and/or care coordination services.   Consent to Services:  The patient was given the following information about Chronic Care Management services today, agreed to services, and gave verbal consent: 1. CCM service includes personalized support from designated clinical staff supervised by the primary care provider, including individualized plan of care and coordination with other care providers 2. 24/7 contact phone numbers for assistance for urgent and routine care needs. 3. Service will only be billed when office clinical staff spend 20 minutes or more in a month to coordinate care. 4. Only one practitioner may furnish and bill the service in a calendar month. 5.The patient may stop CCM services at any time (effective at the end of the month) by phone call to the office staff. 6. The patient will be responsible for cost sharing (co-pay) of up to 20% of the service fee (after annual deductible is met). Patient agreed to services and consent obtained.  Patient agreed to services and verbal consent obtained.   Assessment: Review of patient past medical history, allergies, medications, health status, including review of consultants reports, laboratory and other test data, was performed as part of comprehensive evaluation and provision of chronic care management services.   SDOH (Social Determinants of Health) assessments and interventions performed:    CCM Care Plan  Allergies  Allergen Reactions  . Sulfa Antibiotics Other (See Comments)  . Torsemide   . Keflet  [Cephalexin] Rash    Outpatient Encounter Medications as of 10/19/2020  Medication Sig  . diclofenac Sodium (VOLTAREN) 1 % GEL SMARTSIG:2 Gram(s) Topical 1 to 4 Times Daily  . diltiazem (CARDIZEM CD) 120 MG 24 hr capsule TAKE 1 CAPSULE BY MOUTH EVERY DAY  . ferrous sulfate 325 (65 FE) MG tablet Take 325 mg by mouth daily with breakfast.  . finasteride (PROSCAR) 5 MG tablet TAKE 1 TABLET BY MOUTH EVERY DAY  . ketoconazole (NIZORAL) 2 % shampoo USE AS DIRECTED, ROTATE BETWEEN OVER THE COUNTER SHAMPOOS  . Mesalamine 800 MG TBEC TAKE 1 TABLET BY MOUTH TWICE A DAY  . pantoprazole (PROTONIX) 20 MG tablet TAKE 1 TABLET BY MOUTH EVERY DAY AS DIRECTED  . simvastatin (ZOCOR) 20 MG tablet TAKE 1 TABLET BY MOUTH EVERY DAY  . tamsulosin (FLOMAX) 0.4 MG CAPS capsule TAKE 1 CAPSULE BY MOUTH EVERY DAY  . TRADJENTA 5 MG TABS tablet TAKE 1 TABLET BY MOUTH EVERY DAY  . triamcinolone cream (KENALOG) 0.1 % Apply 1 application topically 2 (two) times daily.  Marland Kitchen acetaminophen (TYLENOL) 500 MG tablet Take 1-2 tablets (500-1,000 mg total) by mouth every 6 (six) hours as needed.  . furosemide (LASIX) 20 MG tablet TAKE 1 AND 1/2 TABLET BY ORAL ROUTE 1 TIMES PER DAY (Patient taking differently: Takes 20 mg once a day.)  . traZODone (DESYREL) 100 MG tablet TAKE 1 TABLET BY MOUTH AT NIGHT  . [DISCONTINUED] clindamycin (CLEOCIN) 150 MG capsule Take 1 capsule (150 mg total) by mouth 3 (three) times daily.   No facility-administered encounter medications on file as of 10/19/2020.    Patient Active Problem  List   Diagnosis Date Noted  . Abnormal laboratory test 10/12/2020  . Cellulitis of right lower extremity 10/12/2020  . Cellulitis of left lower leg 10/12/2020  . Coagulation defect ([place]) 10/05/2020  . Cellulitis of right leg 10/05/2020  . Laceration of scalp 07/12/2020  . Vitamin D deficiency   . BMI 28.0-28.9,adult 10/16/2019  . 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 Mixed hyperlipidemia   . Essential  hypertension   . Diabetes mellitus type 2 with peripheral artery disease ([place])   . Benign prostate hyperplasia   . Hammer toes of both feet 03/21/2017  . Right ankle joint deformity 03/21/2017  . Ankle fracture 06/29/2016  . Varicose veins of lower extremities with complications 16/03/9603  . Chronic venous insufficiency 05/14/2015  . PAF (paroxysmal atrial fibrillation) ([place]) 09/16/2014  . Persistent atrial fibrillation ([place])   . Hypotension 08/04/2014  . CKD (chronic kidney disease) stage 4, GFR 15-29 ml/min ([facility]) 08/04/2014    Conditions to be addressed/monitored:DMII and cellulitis  Care Plan : Cellulitis- ineffective management of medication to help reduce fluid overload in lower extremities  Updates made by Thana Ates, RN since 10/19/2020 12:00 AM    Problem: Medication Adherence ([facility])     Long-Range Goal: Medication Adherence Maintained   Start Date: 10/19/2020  Expected End Date: 01/18/2021  This Visit's Progress: On track  Priority: High  Note:    Current Barriers:   Ineffective Self Health Maintenance- Patient is not taking medications as prescribed.  Unable to self administer medications as prescribed Clinical Goal(s):  Marland Kitchen Collaboration with Glen Anes, MD regarding development and update of comprehensive plan of care as evidenced by provider attestation and co-signature . Inter-disciplinary care team collaboration (see longitudinal plan of care)  patient will work with care management team to address care coordination and chronic disease management needs related to Medication Management and Education   Interventions:   Evaluation of current treatment plan related to DMII, fluid overload and self-management and patient's adherence to plan as established by provider.  Collaboration with Glen Anes, MD regarding development and update of comprehensive plan of care as evidenced by provider attestation       and  co-signature  Inter-disciplinary care team collaboration (see longitudinal plan of care)  Discussed plans with patient for ongoing care management follow up and provided patient with direct contact information for care management team  Reviewed with patient the reason for taking Lasix and the importance of not skipping or self reducing medications.  Reviewed signs and symptoms of infection and encouraged patient to call MD office at the onset of increased swelling, pain, redness or drainage.  Self Care Activities:  . Patient verbalizes understanding of plan to take Lasix as prescribed . Self administers medications as prescribed using the aid of a pill box.  . Attends all scheduled provider appointments . Calls provider office for new concerns or questions Patient Goals: - continue to use a pill box to sort medication and patient will add another 1/2 tablet of lasix to my daily pill planner - Take my medications as prescribed.  Follow Up Plan: Telephone follow up appointment with care management team member scheduled for: 11/23/2020   Care Plan : Diabetes Type 2 - Patient will self monitor weekly as per MD recommendation  Updates made by Thana Ates, RN since 10/19/2020 12:00 AM    Problem: Disease Progression (Diabetes, Type 2)   Priority: Medium  Onset Date: 10/19/2020  Note:   Objective:  Lab Results  Component Value Date   HGBA1C 7.0 (H) 10/05/2020 .   Lab Results  Component Value Date   CREATININE 1.47 (H) 10/05/2020   CREATININE 1.46 (H) 05/17/2020   CREATININE 1.34 (H) 02/09/2020 .   Lab Results  Component Value Date   EGFR 47 (L) 10/05/2020 .   Current Barriers:  . Does not have glucometer to monitor blood sugar-  patient reports no CBG meter or CBG supplies due to cost a long time ago.  . Does not adhere to provider recommendations re:  MD recommends patient self monitor weekly and record  readings.  . Does not adhere to prescribed medication regimen: Patient not  taking Lasix as prescribed Case Manager Clinical Goal(s):  . patient will demonstrate improved adherence to prescribed treatment plan for diabetes self care/management as evidenced by: adherence to prescribed medication regimen, contacting provider for new or worsened symptoms or questions. . Patient will self monitor weekly and record on calendar.  Interventions:  . Collaboration with Glen Anes, MD regarding development and update of comprehensive plan of care as evidenced by provider attestation and co-signature . Inter-disciplinary care team collaboration (see longitudinal plan of care) . Reviewed medications with patient and discussed importance of medication adherence . Discussed plans with patient for ongoing care management follow up and provided patient with direct contact information for care management team . Reviewed scheduled/upcoming provider appointments including: this case manager follow up on 11/23/2020 . Advised patient, providing education and rationale, to check cbg weekly and record, calling MD office for findings outside established parameters.   . Review of patient status, including review of consultants reports, relevant laboratory and other test results, and medications completed. Reviewed Hgb A1c results and goals.  Marland Kitchen Spoke with MD about recommendations for monitoring and requested CBG and supply orders be sent to the pharmacy. Self-Care Activities - Self administers oral medications as prescribed Attends all scheduled provider appointments Patient Goals: - check blood sugar at prescribed times - take the blood sugar log to all doctor visits - take the blood sugar meter to all doctor visits  Follow Up Plan: Telephone follow up appointment with care management team member scheduled for: 11/23/2020   Long-Range Goal: Monitor and Manage My Blood Sugar-Diabetes Type 2   Start Date: 10/19/2020  This Visit's Progress: On track  Priority: Medium  Note:    Timeframe:  Long-Range Goal Priority:  Medium Start Date:                             Expected End Date:                       Follow Up Date 11/23/2020    Objective:  Lab Results  Component Value Date   HGBA1C 7.0 (H) 10/05/2020 .   Lab Results  Component Value Date   CREATININE 1.47 (H) 10/05/2020   CREATININE 1.46 (H) 05/17/2020   CREATININE 1.34 (H) 02/09/2020 .   Lab Results  Component Value Date   EGFR 47 (L) 10/05/2020   Current Barriers:  . Does not have glucometer to monitor blood sugar- patient reports no meter or supplies . Does not adhere to provider recommendations re:  self monitor weekly . Does not adhere to prescribed medication regimen- take lasix as prescribed Case Manager Clinical Goal(s):  . patient will demonstrate improved adherence to prescribed treatment plan for diabetes self care/management as evidenced by: adherence to prescribed medication regimen  contacting provider for new or worsened symptoms or questions.  . Encouraged patient to report any new signs of infection to MD immediately. . Interventions:  . Collaboration with Glen Anes, MD regarding development and update of comprehensive plan of care as evidenced by provider attestation and co-signature . Inter-disciplinary care team collaboration (see longitudinal plan of care) . Reviewed medications with patient and discussed importance of medication adherence Self-Care Activities - Checks blood sugars as prescribed and utilize hyper and hypoglycemia protocol as needed Patient Goals: - check blood sugar at prescribed times - take the blood sugar log to all doctor visits - take the blood sugar meter to all doctor visits   Follow Up Plan: Telephone follow up appointment with care management team member scheduled for: 11/23/2020         Plan:Telephone follow up appointment with care management team member scheduled for:  11/23/2020  Tomasa Rand, RN, BSN, CEN RN Case  Manager [facility] [phone]

## 2020-10-19 NOTE — Progress Notes (Signed)
Subjective:  Patient ID: Glen Lee, male    DOB: Aug 01, 1933  Age: 87 y.o. MRN: [id number]  Chief Complaint  Patient presents with  . Wound Infection  patient seen in 81, [facility] coordinator is with him.  HPI: follow up from cellulitis both legs.  He was treated with unna boots and clindamycin that gave him diarrhea so he stopped.  The legs look good and healed.  He also has new anemia hemoglobin 9.4.  He is not on any anticoagulant.  Last time he was on xarelto. He denies melena or pain.   Current Outpatient Medications on File Prior to Visit  Medication Sig Dispense Refill  . acetaminophen (TYLENOL) 500 MG tablet Take 1-2 tablets (500-1,000 mg total) by mouth every 6 (six) hours as needed. 60 tablet 0  . diclofenac Sodium (VOLTAREN) 1 % GEL SMARTSIG:2 Gram(s) Topical 1 to 4 Times Daily    . diltiazem (CARDIZEM CD) 120 MG 24 hr capsule TAKE 1 CAPSULE BY MOUTH EVERY DAY 90 capsule 2  . ferrous sulfate 325 (65 FE) MG tablet Take 325 mg by mouth daily with breakfast.    . finasteride (PROSCAR) 5 MG tablet TAKE 1 TABLET BY MOUTH EVERY DAY 90 tablet 2  . furosemide (LASIX) 20 MG tablet TAKE 1 AND 1/2 TABLET BY ORAL ROUTE 1 TIMES PER DAY (Patient taking differently: Takes 20 mg once a day.) 135 tablet 3  . ketoconazole (NIZORAL) 2 % shampoo USE AS DIRECTED, ROTATE BETWEEN OVER THE COUNTER SHAMPOOS    . Mesalamine 800 MG TBEC TAKE 1 TABLET BY MOUTH TWICE A DAY 180 tablet 2  . pantoprazole (PROTONIX) 20 MG tablet TAKE 1 TABLET BY MOUTH EVERY DAY AS DIRECTED 90 tablet 3  . simvastatin (ZOCOR) 20 MG tablet TAKE 1 TABLET BY MOUTH EVERY DAY 90 tablet 2  . tamsulosin (FLOMAX) 0.4 MG CAPS capsule TAKE 1 CAPSULE BY MOUTH EVERY DAY 90 capsule 2  . TRADJENTA 5 MG TABS tablet TAKE 1 TABLET BY MOUTH EVERY DAY 90 tablet 3  . traZODone (DESYREL) 100 MG tablet TAKE 1 TABLET BY MOUTH AT NIGHT 90 tablet 2  . triamcinolone cream (KENALOG) 0.1 % Apply 1 application topically 2 (two) times daily. 453.6 g 3    No current facility-administered medications on file prior to visit.   Past Medical History:  Diagnosis Date  . Anemia   . Arthritis   . Benign prostate hyperplasia   . Diabetes mellitus without complication ([place])   . Edema    right leg  . Enlarged prostate   . Esophageal reflux   . Essential hypertension   . Hyperlipidemia   . Hypertension   . Mixed hyperlipidemia   . Tremor    right hand  . Type 2 diabetes mellitus with other specified complication ([place])   . Vitamin D deficiency    Past Surgical History:  Procedure Laterality Date  . JOINT REPLACEMENT Left    knee  . KNEE SURGERY    . ORIF ANKLE FRACTURE Right 06/30/2016   Procedure: OPEN REDUCTION INTERNAL FIXATION (ORIF) ANKLE FRACTURE;  Surgeon: Altamese Afton, MD;  Location: [facility];  Service: Orthopedics;  Laterality: Right;    Family History  Problem Relation Age of Onset  . Hyperlipidemia Mother   . Diabetes Mother   . Colon cancer Father   . Diabetes Father    Social History   Socioeconomic History  . Marital status: Single    Spouse name: Not on file  . Number  of children: 3  . Years of education: Not on file  . Highest education level: Not on file  Occupational History  . Not on file  Tobacco Use  . Smoking status: Former Smoker    Years: 20.00    Types: Cigarettes    Quit date: 08/04/1992    Years since quitting: 28.2  . Smokeless tobacco: Never Used  Vaping Use  . Vaping Use: Never used  Substance and Sexual Activity  . Alcohol use: Not Currently    Alcohol/week: 0.0 standard drinks  . Drug use: Never  . Sexual activity: Not Currently  Other Topics Concern  . Not on file  Social History Narrative   Concern for pt ability to care for himself. Pt refused placement post hospitalization after rehab   Social Determinants of Health   Financial Resource Strain: Not on file  Food Insecurity: Not on file  Transportation Needs: Not on file  Physical Activity: Not on file  Stress: Not on file   Social Connections: Not on file    Review of Systems  Constitutional: Negative for activity change and appetite change.  HENT: Negative for congestion and rhinorrhea.   Eyes: Negative for visual disturbance.  Respiratory: Negative for chest tightness and shortness of breath.   Cardiovascular: Negative for chest pain, palpitations and leg swelling.  Gastrointestinal: Negative for abdominal distention and abdominal pain.  Endocrine: Negative for polyuria.  Genitourinary: Negative for difficulty urinating, dysuria and urgency.  Musculoskeletal: Negative for arthralgias and back pain.  Skin:       Legs are well healed.  Neurological: Negative.      Objective:  BP 112/66   Pulse 72   Temp 98.6 F (37 C)   Resp 16   Ht 5' 9"  (1.753 m)   Wt 196 lb (88.9 kg)   SpO2 98%   BMI 28.94 kg/m   BP/Weight 10/19/2020 10/12/2020 5/72/6203  Systolic BP 559 741 638  Diastolic BP 66 74 60  Wt. (Lbs) 196 196 200  BMI 28.94 28.94 29.53    Physical Exam Vitals reviewed.  Constitutional:      Appearance: Normal appearance.  HENT:     Head: Normocephalic.     Right Ear: Tympanic membrane, ear canal and external ear normal.     Left Ear: Tympanic membrane, ear canal and external ear normal.     Mouth/Throat:     Mouth: Mucous membranes are moist.     Pharynx: Oropharynx is clear.  Eyes:     Extraocular Movements: Extraocular movements intact.     Conjunctiva/sclera: Conjunctivae normal.     Pupils: Pupils are equal, round, and reactive to light.  Cardiovascular:     Rate and Rhythm: Normal rate and regular rhythm.     Pulses: Normal pulses.     Heart sounds: No murmur heard. No gallop.   Pulmonary:     Effort: Pulmonary effort is normal. No respiratory distress.     Breath sounds: Normal breath sounds. No rales.  Abdominal:     General: Abdomen is flat. Bowel sounds are normal. There is no distension.     Palpations: Abdomen is soft.     Tenderness: There is no abdominal  tenderness.  Musculoskeletal:        General: Tenderness present.     Cervical back: Normal range of motion.     Right lower leg: No edema.     Left lower leg: No edema.  Skin:    Comments: Both legs well healed  and no ulcerations  Neurological:     General: No focal deficit present.     Mental Status: He is alert and oriented to person, place, and time.       Lab Results  Component Value Date   WBC 5.4 10/12/2020   HGB 9.4 (L) 10/12/2020   HCT 29.7 (L) 10/12/2020   PLT 202 10/12/2020   GLUCOSE 117 (H) 10/05/2020   CHOL 123 10/05/2020   TRIG 72 10/05/2020   HDL 58 10/05/2020   LDLCALC 50 10/05/2020   ALT 11 10/05/2020   AST 15 10/05/2020   NA 140 10/05/2020   K 4.2 10/05/2020   CL 103 10/05/2020   CREATININE 1.47 (H) 10/05/2020   BUN 27 10/05/2020   CO2 22 10/05/2020   TSH 0.713 08/05/2014   INR 3.22 (H) 08/04/2014   HGBA1C 7.0 (H) 10/05/2020   MICROALBUR 30 05/17/2020      Assessment & Plan:   Diagnoses and all orders for this visit: Cellulitis of right lower extremity The cellulitis has resoled in right leg and all ulcers and weeping has resolved  Cellulitis of left lower leg Cellulitis has resolved in left LE with no ulcerations or weeping  Anemia, unspecified type -     Ferritin -     Vitamin B12 -     Reticulocyte, Hemoglobin Panel Patient is having new anemia       Follow-up: Return in about 2 weeks (around 11/02/2020) for legs and anemia.  An After Visit Summary was printed and given to the patient.  Reinaldo Meeker, MD [facility] [phone]

## 2020-10-19 NOTE — Patient Instructions (Signed)
Visit Information  PATIENT GOALS:  Goals Addressed            This Visit's Progress   . Manage My Medicine   On track    Timeframe:  Long-Range Goal Priority:  High Start Date:        10/19/2020                    Expected End Date:    01/19/2021                   Follow Up Date 11/23/2020   - continue to use a pill box to sort medication and patient will add another 1/2 tablet of lasix to my daily pill planner - Take my medications as prescribed.    Why is this important?   . These steps will help you keep on track with your medicines.       . Monitor and Manage My Blood Sugar-Diabetes Type 2   On track    Timeframe:  Long-Range Goal Priority:  Medium Start Date:        10/19/2020                     Expected End Date: 01/19/2021             Follow Up Date 11/23/2020   - check blood sugar at prescribed times - take the blood sugar log to all doctor visits - take the blood sugar meter to all doctor visits          Glen Lee was given information about Care Management services by the embedded care coordination team including:  1. Care Management services include personalized support from designated clinical staff supervised by his physician, including individualized plan of care and coordination with other care providers 2. 24/7 contact phone numbers for assistance for urgent and routine care needs. 3. The patient may stop CCM services at any time (effective at the end of the month) by phone call to the office staff.  Patient agreed to services and verbal consent obtained.   The patient verbalized understanding of instructions, educational materials, and care plan provided today and agreed to receive a mailed copy of patient instructions, educational materials, and care plan.   Telephone follow up appointment with care management team member scheduled for: 11/23/2020  Tomasa Rand, RN, BSN, CEN RN Case Manager [facility] [phone]

## 2020-10-21 ENCOUNTER — Other Ambulatory Visit: Payer: Self-pay | Admitting: Legal Medicine

## 2020-10-26 LAB — VITAMIN B12: Vitamin B-12: 313 pg/mL (ref 232–1245)

## 2020-10-26 LAB — RETICULOCYTE, HEMOGLOBIN PANEL

## 2020-10-26 LAB — FERRITIN: Ferritin: 108 ng/mL (ref 30–400)

## 2020-10-26 NOTE — Progress Notes (Signed)
Ferritin 108 normal, B12 313 normal, reticulocyte level not performed lp

## 2020-10-28 ENCOUNTER — Ambulatory Visit (INDEPENDENT_AMBULATORY_CARE_PROVIDER_SITE_OTHER): Payer: Medicare Other

## 2020-10-28 DIAGNOSIS — D649 Anemia, unspecified: Secondary | ICD-10-CM

## 2020-10-28 LAB — POC HEMOCCULT BLD/STL (HOME/3-CARD/SCREEN)
Card #2 Fecal Occult Blod, POC: POSITIVE
Card #3 Fecal Occult Blood, POC: NEGATIVE
Fecal Occult Blood, POC: POSITIVE — AB

## 2020-10-28 NOTE — Progress Notes (Signed)
One positive hemocult, low RBC, suggest referral to GI lp

## 2020-10-29 ENCOUNTER — Other Ambulatory Visit: Payer: Self-pay

## 2020-10-29 DIAGNOSIS — K921 Melena: Secondary | ICD-10-CM

## 2020-10-29 DIAGNOSIS — D649 Anemia, unspecified: Secondary | ICD-10-CM

## 2020-10-29 NOTE — Progress Notes (Signed)
I called his niece and I gave her the  results. I put the order for GI referral to Dr Levan Hurst (patient preference).

## 2020-11-01 ENCOUNTER — Ambulatory Visit: Payer: Medicare Other | Admitting: Family Medicine

## 2020-11-01 ENCOUNTER — Encounter: Payer: Self-pay | Admitting: Family Medicine

## 2020-11-01 ENCOUNTER — Other Ambulatory Visit: Payer: Self-pay

## 2020-11-01 VITALS — BP 130/52 | HR 68 | Temp 98.5°F | Resp 16 | Ht 70.0 in | Wt 197.4 lb

## 2020-11-01 DIAGNOSIS — L03115 Cellulitis of right lower limb: Secondary | ICD-10-CM

## 2020-11-01 DIAGNOSIS — L03116 Cellulitis of left lower limb: Secondary | ICD-10-CM | POA: Diagnosis not present

## 2020-11-01 DIAGNOSIS — R600 Localized edema: Secondary | ICD-10-CM

## 2020-11-01 MED ORDER — FUROSEMIDE 20 MG PO TABS: 40.0000 mg | ORAL_TABLET | Freq: Every day | ORAL | 0 refills | Status: DC

## 2020-11-01 MED ORDER — CIPROFLOXACIN HCL 250 MG PO TABS: 250.0000 mg | ORAL_TABLET | Freq: Two times a day (BID) | ORAL | 0 refills | Status: AC

## 2020-11-01 NOTE — Progress Notes (Signed)
Acute Office Visit  Subjective:    Patient ID: Glen Lee, male    DOB: 06/29/1933, 87 y.o.   MRN: [id number]  Chief Complaint  Patient presents with  . Cellulitis    HPI Patient is in today for BL leg edema. Has had unna boots in the past which has helped.  They were last removed a week and a half ago.  His leg started to bubble up on this past Friday.  Some mild redness.  Following the last antibiotic, clindamycin, he was given, the patient developed bloody diarrhea.  Hemoglobin was stable.  Past Medical History:  Diagnosis Date  . Anemia   . Arthritis   . Benign prostate hyperplasia   . Diabetes mellitus without complication ([place])   . Edema    right leg  . Enlarged prostate   . Esophageal reflux   . Essential hypertension   . Hyperlipidemia   . Hypertension   . Mixed hyperlipidemia   . Tremor    right hand  . Type 2 diabetes mellitus with other specified complication ([place])   . Vitamin D deficiency     Past Surgical History:  Procedure Laterality Date  . JOINT REPLACEMENT Left    knee  . KNEE SURGERY    . ORIF ANKLE FRACTURE Right 06/30/2016   Procedure: OPEN REDUCTION INTERNAL FIXATION (ORIF) ANKLE FRACTURE;  Surgeon: Altamese Marianna, MD;  Location: [facility];  Service: Orthopedics;  Laterality: Right;    Family History  Problem Relation Age of Onset  . Hyperlipidemia Mother   . Diabetes Mother   . Colon cancer Father   . Diabetes Father     Social History   Socioeconomic History  . Marital status: Single    Spouse name: Not on file  . Number of children: 3  . Years of education: Not on file  . Highest education level: Not on file  Occupational History  . Not on file  Tobacco Use  . Smoking status: Former Smoker    Years: 20.00    Types: Cigarettes    Quit date: 08/04/1992    Years since quitting: 28.2  . Smokeless tobacco: Never Used  Vaping Use  . Vaping Use: Never used  Substance and Sexual Activity  . Alcohol use: Not Currently     Alcohol/week: 0.0 standard drinks  . Drug use: Never  . Sexual activity: Not Currently  Other Topics Concern  . Not on file  Social History Narrative   Concern for pt ability to care for himself. Pt refused placement post hospitalization after rehab   Social Determinants of Health   Financial Resource Strain: Not on file  Food Insecurity: Not on file  Transportation Needs: Not on file  Physical Activity: Not on file  Stress: Not on file  Social Connections: Not on file  Intimate Partner Violence: Not on file    Outpatient Medications Prior to Visit  Medication Sig Dispense Refill  . acetaminophen (TYLENOL) 500 MG tablet Take 1-2 tablets (500-1,000 mg total) by mouth every 6 (six) hours as needed. 60 tablet 0  . Blood Glucose Monitoring Suppl ([place]) w/Device KIT 1 each by Does not apply route daily. 1 kit 0  . diclofenac Sodium (VOLTAREN) 1 % GEL SMARTSIG:2 Gram(s) Topical 1 to 4 Times Daily    . diltiazem (CARDIZEM CD) 120 MG 24 hr capsule TAKE 1 CAPSULE BY MOUTH EVERY DAY 90 capsule 2  . ferrous sulfate 325 (65 FE) MG tablet Take 325  mg by mouth daily with breakfast.    . finasteride (PROSCAR) 5 MG tablet TAKE 1 TABLET BY MOUTH EVERY DAY 90 tablet 2  . glucose blood (ONETOUCH VERIO) test strip Use as instructed 100 each 12  . ketoconazole (NIZORAL) 2 % shampoo USE AS DIRECTED, ROTATE BETWEEN OVER THE COUNTER SHAMPOOS    . Lancets (ONETOUCH ULTRASOFT) lancets Use as instructed 100 each 12  . Mesalamine 800 MG TBEC TAKE 1 TABLET BY MOUTH TWICE A DAY 180 tablet 2  . pantoprazole (PROTONIX) 20 MG tablet TAKE 1 TABLET BY MOUTH EVERY DAY AS DIRECTED 90 tablet 3  . simvastatin (ZOCOR) 20 MG tablet TAKE 1 TABLET BY MOUTH EVERY DAY 90 tablet 2  . tamsulosin (FLOMAX) 0.4 MG CAPS capsule TAKE 1 CAPSULE BY MOUTH EVERY DAY 90 capsule 2  . TRADJENTA 5 MG TABS tablet TAKE 1 TABLET BY MOUTH EVERY DAY 90 tablet 3  . traZODone (DESYREL) 100 MG tablet TAKE 1 TABLET BY MOUTH AT  NIGHT 90 tablet 2  . triamcinolone cream (KENALOG) 0.1 % APPLY TO AFFECTED AREA TWICE A DAY 454 g 3  . furosemide (LASIX) 20 MG tablet TAKE 1 AND 1/2 TABLET BY ORAL ROUTE 1 TIMES PER DAY (Patient taking differently: Takes 20 mg once a day.) 135 tablet 3   No facility-administered medications prior to visit.    Allergies  Allergen Reactions  . Clindamycin/Lincomycin Diarrhea    bloody  . Doxycycline   . Sulfa Antibiotics Other (See Comments)  . Torsemide   . Keflet [Cephalexin] Rash    Review of Systems  Constitutional: Negative for chills and fever.  Respiratory: Negative for cough and shortness of breath.   Gastrointestinal: Negative for blood in stool and diarrhea.  Neurological: Negative for dizziness.       Objective:    Physical Exam Vitals reviewed.  Constitutional:      Appearance: Normal appearance. He is normal weight.  Musculoskeletal:     Right lower leg: Edema present.     Left lower leg: Edema present.  Skin:    Findings: Erythema (2nd and 3rd toe of left foot erythema. no drainage. ) and rash (erythema lower legs BL ) present.  Neurological:     Mental Status: He is alert.  Psychiatric:        Mood and Affect: Mood normal.        Behavior: Behavior normal.     BP (!) 130/52   Pulse 68   Temp 98.5 F (36.9 C)   Resp 16   Ht _0  (1.778 m)   Wt 197 lb 6.4 oz (89.5 kg)   BMI 28.32 kg/m  Wt Readings from Last 3 Encounters:  11/01/20 197 lb 6.4 oz (89.5 kg)  10/19/20 196 lb (88.9 kg)  10/12/20 196 lb (88.9 kg)    Health Maintenance Due  Topic Date Due  . COVID-19 Vaccine (1) Never done  . OPHTHALMOLOGY EXAM  Never done    There are no preventive care reminders to display for this patient.   Lab Results  Component Value Date   TSH 0.713 08/05/2014   Lab Results  Component Value Date   WBC 5.4 10/12/2020   HGB 9.4 (L) 10/12/2020   HCT 29.7 (L) 10/12/2020   MCV 87 10/12/2020   PLT 202 10/12/2020   Lab Results  Component Value  Date   NA 140 10/05/2020   K 4.2 10/05/2020   CO2 22 10/05/2020   GLUCOSE 117 (H) 10/05/2020  BUN 27 10/05/2020   CREATININE 1.47 (H) 10/05/2020   BILITOT 0.6 10/05/2020   ALKPHOS 125 (H) 10/05/2020   AST 15 10/05/2020   ALT 11 10/05/2020   PROT 6.9 10/05/2020   ALBUMIN 4.0 10/05/2020   CALCIUM 9.5 10/05/2020   ANIONGAP 8 07/03/2016   EGFR 47 (L) 10/05/2020   Lab Results  Component Value Date   CHOL 123 10/05/2020   Lab Results  Component Value Date   HDL 58 10/05/2020   Lab Results  Component Value Date   LDLCALC 50 10/05/2020   Lab Results  Component Value Date   TRIG 72 10/05/2020   Lab Results  Component Value Date   CHOLHDL 2.1 10/05/2020   Lab Results  Component Value Date   HGBA1C 7.0 (H) 10/05/2020       Assessment & Plan:  1. Pedal edema Unna boots applied BL.  Increase lasix 20 mg 2 daily. Allergic to torsemide. Consider bumex. Pt frustrated with recurrent swelling and unna boot requirements.  2. Cellulitis of left lower extremity/Foot Unna boots applied.  - ciprofloxacin (CIPRO) 250 MG tablet; Take 1 tablet (250 mg total) by mouth 2 (two) times daily for 5 days.  Dispense: 10 tablet; Refill: 0  3. Cellulitis of right lower extremity Unna Boots applied - ciprofloxacin (CIPRO) 250 MG tablet; Take 1 tablet (250 mg total) by mouth 2 (two) times daily for 5 days.  Dispense: 10 tablet; Refill: 0    Meds ordered this encounter  Medications  . ciprofloxacin (CIPRO) 250 MG tablet    Sig: Take 1 tablet (250 mg total) by mouth 2 (two) times daily for 5 days.    Dispense:  10 tablet    Refill:  0  . furosemide (LASIX) 20 MG tablet    Sig: Take 2 tablets (40 mg total) by mouth daily.    Dispense:  1 tablet    Refill:  0     Follow-up: Return in about 5 days (around 11/06/2020) for Dr. Henrene Pastor.  An After Visit Summary was printed and given to the patient.  Rochel Brome, MD [facility] [phone]

## 2020-11-01 NOTE — Patient Instructions (Signed)
Increase lasix to 20 mg 2 daily.  Cipro 250 mg one twice a day for 5 days.  Follow up with Dr. Henrene Pastor on Friday.

## 2020-11-05 ENCOUNTER — Encounter: Payer: Self-pay | Admitting: Legal Medicine

## 2020-11-05 ENCOUNTER — Ambulatory Visit: Payer: Medicare Other | Admitting: Legal Medicine

## 2020-11-05 ENCOUNTER — Other Ambulatory Visit: Payer: Self-pay

## 2020-11-05 VITALS — BP 130/60 | HR 63 | Temp 97.3°F | Resp 16 | Ht 70.0 in | Wt 192.0 lb

## 2020-11-05 DIAGNOSIS — L03116 Cellulitis of left lower limb: Secondary | ICD-10-CM

## 2020-11-05 NOTE — Progress Notes (Signed)
Subjective:  Patient ID: Glen Lee, male    DOB: [birth year]  Age: 87 y.o. MRN: [id number]  Chief Complaint  Patient presents with  . Cellulitis    HPI: cellulitis and stasis edema both legs, the left middle toe is red between toes.  The unna boots per removed and edema has resolved.     Current Outpatient Medications on File Prior to Visit  Medication Sig Dispense Refill  . acetaminophen (TYLENOL) 500 MG tablet Take 1-2 tablets (500-1,000 mg total) by mouth every 6 (six) hours as needed. 60 tablet 0  . Blood Glucose Monitoring Suppl ([place]) w/Device KIT 1 each by Does not apply route daily. 1 kit 0  . diclofenac Sodium (VOLTAREN) 1 % GEL SMARTSIG:2 Gram(s) Topical 1 to 4 Times Daily    . diltiazem (CARDIZEM CD) 120 MG 24 hr capsule TAKE 1 CAPSULE BY MOUTH EVERY DAY 90 capsule 2  . ferrous sulfate 325 (65 FE) MG tablet Take 325 mg by mouth daily with breakfast.    . finasteride (PROSCAR) 5 MG tablet TAKE 1 TABLET BY MOUTH EVERY DAY 90 tablet 2  . furosemide (LASIX) 20 MG tablet Take 2 tablets (40 mg total) by mouth daily. 1 tablet 0  . glucose blood (ONETOUCH VERIO) test strip Use as instructed 100 each 12  . ketoconazole (NIZORAL) 2 % shampoo USE AS DIRECTED, ROTATE BETWEEN OVER THE COUNTER SHAMPOOS    . Lancets (ONETOUCH ULTRASOFT) lancets Use as instructed 100 each 12  . Mesalamine 800 MG TBEC TAKE 1 TABLET BY MOUTH TWICE A DAY 180 tablet 2  . pantoprazole (PROTONIX) 20 MG tablet TAKE 1 TABLET BY MOUTH EVERY DAY AS DIRECTED 90 tablet 3  . simvastatin (ZOCOR) 20 MG tablet TAKE 1 TABLET BY MOUTH EVERY DAY 90 tablet 2  . tamsulosin (FLOMAX) 0.4 MG CAPS capsule TAKE 1 CAPSULE BY MOUTH EVERY DAY 90 capsule 2  . TRADJENTA 5 MG TABS tablet TAKE 1 TABLET BY MOUTH EVERY DAY 90 tablet 3  . traZODone (DESYREL) 100 MG tablet TAKE 1 TABLET BY MOUTH AT NIGHT 90 tablet 2  . triamcinolone cream (KENALOG) 0.1 % APPLY TO AFFECTED AREA TWICE A DAY 454 g 3   No current  facility-administered medications on file prior to visit.   Past Medical History:  Diagnosis Date  . Anemia   . Arthritis   . Benign prostate hyperplasia   . Diabetes mellitus without complication ([place])   . Edema    right leg  . Enlarged prostate   . Esophageal reflux   . Essential hypertension   . Hyperlipidemia   . Hypertension   . Mixed hyperlipidemia   . Tremor    right hand  . Type 2 diabetes mellitus with other specified complication ([place])   . Vitamin D deficiency    Past Surgical History:  Procedure Laterality Date  . JOINT REPLACEMENT Left    knee  . KNEE SURGERY    . ORIF ANKLE FRACTURE Right 06/30/2016   Procedure: OPEN REDUCTION INTERNAL FIXATION (ORIF) ANKLE FRACTURE;  Surgeon: Altamese Cullman, MD;  Location: [facility];  Service: Orthopedics;  Laterality: Right;    Family History  Problem Relation Age of Onset  . Hyperlipidemia Mother   . Diabetes Mother   . Colon cancer Father   . Diabetes Father    Social History   Socioeconomic History  . Marital status: Single    Spouse name: Not on file  . Number of children: 3  .  Years of education: Not on file  . Highest education level: Not on file  Occupational History  . Not on file  Tobacco Use  . Smoking status: Former Smoker    Years: 20.00    Types: Cigarettes    Quit date: 08/04/1992    Years since quitting: 28.2  . Smokeless tobacco: Never Used  Vaping Use  . Vaping Use: Never used  Substance and Sexual Activity  . Alcohol use: Not Currently    Alcohol/week: 0.0 standard drinks  . Drug use: Never  . Sexual activity: Not Currently  Other Topics Concern  . Not on file  Social History Narrative   Concern for pt ability to care for himself. Pt refused placement post hospitalization after rehab   Social Determinants of Health   Financial Resource Strain: Not on file  Food Insecurity: Not on file  Transportation Needs: Not on file  Physical Activity: Not on file  Stress: Not on file  Social  Connections: Not on file    Review of Systems  Constitutional: Negative for activity change and appetite change.  HENT: Negative.   Eyes: Negative for visual disturbance.  Respiratory: Negative for chest tightness and shortness of breath.   Cardiovascular: Negative.  Negative for chest pain and leg swelling.  Gastrointestinal: Negative.   Endocrine: Negative for polyuria.  Genitourinary: Negative for difficulty urinating and dysuria.  Musculoskeletal: Negative for arthralgias and back pain.  Skin: Negative.   Psychiatric/Behavioral: Negative.      Objective:  BP 130/60   Pulse 63   Temp (!) 97.3 F (36.3 C)   Resp 16   Ht 5' 10"  (1.778 m)   Wt 192 lb (87.1 kg)   SpO2 99%   BMI 27.55 kg/m   BP/Weight 11/05/2020 11/01/2020 11/07/4130  Systolic BP 440 102 725  Diastolic BP 60 52 66  Wt. (Lbs) 192 197.4 196  BMI 27.55 28.32 28.94    Physical Exam Vitals reviewed.  Constitutional:      Appearance: Normal appearance.  Cardiovascular:     Rate and Rhythm: Normal rate.     Pulses: Normal pulses.     Heart sounds: Normal heart sounds. No murmur heard. No gallop.   Pulmonary:     Effort: No respiratory distress.     Breath sounds: Normal breath sounds. No rales.  Musculoskeletal:     Cervical back: Normal range of motion and neck supple.     Right lower leg: No edema.     Left lower leg: Edema present.     Comments: Left middle toe still red.  Neurological:     Mental Status: He is alert.     Diabetic Foot Exam - Simple   No data filed      Lab Results  Component Value Date   WBC 5.4 10/12/2020   HGB 9.4 (L) 10/12/2020   HCT 29.7 (L) 10/12/2020   PLT 202 10/12/2020   GLUCOSE 117 (H) 10/05/2020   CHOL 123 10/05/2020   TRIG 72 10/05/2020   HDL 58 10/05/2020   LDLCALC 50 10/05/2020   ALT 11 10/05/2020   AST 15 10/05/2020   NA 140 10/05/2020   K 4.2 10/05/2020   CL 103 10/05/2020   CREATININE 1.47 (H) 10/05/2020   BUN 27 10/05/2020   CO2 22 10/05/2020    TSH 0.713 08/05/2014   INR 3.22 (H) 08/04/2014   HGBA1C 7.0 (H) 10/05/2020   MICROALBUR 30 05/17/2020      Assessment & Plan:  Diagnoses and all orders for this visit: Cellulitis of left lower leg The cellulitis and swelling righ tleg has resolved with unna boot, the left leg still has some redness and redness in middle toe       Follow-up: Return in about 1 week (around 11/12/2020) for edma legs.  An After Visit Summary was printed and given to the patient.  Reinaldo Meeker, MD [facility] [phone]

## 2020-11-12 ENCOUNTER — Ambulatory Visit: Payer: Medicare Other | Admitting: Legal Medicine

## 2020-11-12 ENCOUNTER — Other Ambulatory Visit: Payer: Self-pay

## 2020-11-12 ENCOUNTER — Encounter: Payer: Self-pay | Admitting: Legal Medicine

## 2020-11-12 VITALS — BP 136/58 | HR 67 | Temp 97.1°F | Ht 70.0 in | Wt 180.0 lb

## 2020-11-12 DIAGNOSIS — B353 Tinea pedis: Secondary | ICD-10-CM | POA: Diagnosis not present

## 2020-11-12 DIAGNOSIS — L03115 Cellulitis of right lower limb: Secondary | ICD-10-CM

## 2020-11-12 DIAGNOSIS — I10 Essential (primary) hypertension: Secondary | ICD-10-CM | POA: Diagnosis not present

## 2020-11-12 DIAGNOSIS — N184 Chronic kidney disease, stage 4 (severe): Secondary | ICD-10-CM

## 2020-11-12 DIAGNOSIS — E782 Mixed hyperlipidemia: Secondary | ICD-10-CM

## 2020-11-12 DIAGNOSIS — N400 Enlarged prostate without lower urinary tract symptoms: Secondary | ICD-10-CM

## 2020-11-12 DIAGNOSIS — E1151 Type 2 diabetes mellitus with diabetic peripheral angiopathy without gangrene: Secondary | ICD-10-CM

## 2020-11-12 DIAGNOSIS — I4819 Other persistent atrial fibrillation: Secondary | ICD-10-CM

## 2020-11-12 DIAGNOSIS — E1121 Type 2 diabetes mellitus with diabetic nephropathy: Secondary | ICD-10-CM

## 2020-11-12 MED ORDER — TOLNAFTATE 1 % EX POWD
1.0000 | Freq: Two times a day (BID) | CUTANEOUS | 3 refills | Status: DC
Start: 2020-11-12 — End: 2021-04-05

## 2020-11-12 NOTE — Progress Notes (Signed)
Subjective:  Patient ID: Glen Lee, male    DOB: Apr 09, 1934  Age: 87 y.o. MRN: [id number]  Chief Complaint  Patient presents with  . Cellulitis    HPI: follow up for cellulitis left leg and edema.  Unna boot removed right leg, the left toes are still macerated  Patient presents for follow up of hypertension.  Patient tolerating diltiazem,  well with side effects.  Patient was diagnosed with hypertension 2010 so has been treated for hypertension for 10 years.Patient is working on maintaining diet and exercise regimen and follows up as directed. Complication include CHF.  Patient presents with hyperlipidemia.  Compliance with treatment has been good; patient takes medicines as directed, maintains low cholesterol diet, follows up as directed, and maintains exercise regimen.  Patient is using simvastatin without problems..   Current Outpatient Medications on File Prior to Visit  Medication Sig Dispense Refill  . acetaminophen (TYLENOL) 500 MG tablet Take 1-2 tablets (500-1,000 mg total) by mouth every 6 (six) hours as needed. 60 tablet 0  . Blood Glucose Monitoring Suppl ([place]) w/Device KIT 1 each by Does not apply route daily. 1 kit 0  . diclofenac Sodium (VOLTAREN) 1 % GEL SMARTSIG:2 Gram(s) Topical 1 to 4 Times Daily    . diltiazem (CARDIZEM CD) 120 MG 24 hr capsule TAKE 1 CAPSULE BY MOUTH EVERY DAY 90 capsule 2  . ferrous sulfate 325 (65 FE) MG tablet Take 325 mg by mouth daily with breakfast.    . finasteride (PROSCAR) 5 MG tablet TAKE 1 TABLET BY MOUTH EVERY DAY 90 tablet 2  . furosemide (LASIX) 20 MG tablet Take 2 tablets (40 mg total) by mouth daily. 1 tablet 0  . glucose blood (ONETOUCH VERIO) test strip Use as instructed 100 each 12  . ketoconazole (NIZORAL) 2 % shampoo USE AS DIRECTED, ROTATE BETWEEN OVER THE COUNTER SHAMPOOS    . Lancets (ONETOUCH ULTRASOFT) lancets Use as instructed 100 each 12  . Mesalamine 800 MG TBEC TAKE 1 TABLET BY MOUTH TWICE A  DAY 180 tablet 2  . pantoprazole (PROTONIX) 20 MG tablet TAKE 1 TABLET BY MOUTH EVERY DAY AS DIRECTED 90 tablet 3  . simvastatin (ZOCOR) 20 MG tablet TAKE 1 TABLET BY MOUTH EVERY DAY 90 tablet 2  . tamsulosin (FLOMAX) 0.4 MG CAPS capsule TAKE 1 CAPSULE BY MOUTH EVERY DAY 90 capsule 2  . TRADJENTA 5 MG TABS tablet TAKE 1 TABLET BY MOUTH EVERY DAY 90 tablet 3  . traZODone (DESYREL) 100 MG tablet TAKE 1 TABLET BY MOUTH AT NIGHT 90 tablet 2  . triamcinolone cream (KENALOG) 0.1 % APPLY TO AFFECTED AREA TWICE A DAY 454 g 3   No current facility-administered medications on file prior to visit.   Past Medical History:  Diagnosis Date  . Anemia   . Arthritis   . Benign prostate hyperplasia   . Diabetes mellitus without complication ([place])   . Edema    right leg  . Enlarged prostate   . Esophageal reflux   . Essential hypertension   . Hyperlipidemia   . Hypertension   . Mixed hyperlipidemia   . Tremor    right hand  . Type 2 diabetes mellitus with other specified complication ([place])   . Vitamin D deficiency    Past Surgical History:  Procedure Laterality Date  . JOINT REPLACEMENT Left    knee  . KNEE SURGERY    . ORIF ANKLE FRACTURE Right 06/30/2016   Procedure: OPEN REDUCTION  INTERNAL FIXATION (ORIF) ANKLE FRACTURE;  Surgeon: Altamese [place], MD;  Location: [facility];  Service: Orthopedics;  Laterality: Right;    Family History  Problem Relation Age of Onset  . Hyperlipidemia Mother   . Diabetes Mother   . Colon cancer Father   . Diabetes Father    Social History   Socioeconomic History  . Marital status: Single    Spouse name: Not on file  . Number of children: 3  . Years of education: Not on file  . Highest education level: Not on file  Occupational History  . Not on file  Tobacco Use  . Smoking status: Former Smoker    Years: 20.00    Types: Cigarettes    Quit date: 08/04/1992    Years since quitting: 28.2  . Smokeless tobacco: Never Used  Vaping Use  . Vaping Use: Never  used  Substance and Sexual Activity  . Alcohol use: Not Currently    Alcohol/week: 0.0 standard drinks  . Drug use: Never  . Sexual activity: Not Currently  Other Topics Concern  . Not on file  Social History Narrative   Concern for pt ability to care for himself. Pt refused placement post hospitalization after rehab   Social Determinants of Health   Financial Resource Strain: Not on file  Food Insecurity: Not on file  Transportation Needs: Not on file  Physical Activity: Not on file  Stress: Not on file  Social Connections: Not on file    Review of Systems  Constitutional: Negative for chills, diaphoresis, fatigue and fever.  HENT: Negative for congestion, ear pain and sore throat.   Respiratory: Negative for cough and shortness of breath.   Cardiovascular: Negative for chest pain and leg swelling.  Gastrointestinal: Negative for abdominal pain, constipation, diarrhea, nausea and vomiting.  Genitourinary: Negative for dysuria and urgency.  Musculoskeletal: Negative for arthralgias and myalgias.  Neurological: Negative for dizziness and headaches.  Psychiatric/Behavioral: Negative for dysphoric mood.     Objective:  BP (!) 136/58   Pulse 67   Temp (!) 97.1 F (36.2 C)   Ht 5' 10"  (1.778 m)   Wt 180 lb (81.6 kg)   SpO2 97%   BMI 25.83 kg/m   BP/Weight 11/12/2020 11/05/2020 7/62/8315  Systolic BP 176 160 737  Diastolic BP 58 60 52  Wt. (Lbs) 180 192 197.4  BMI 25.83 27.55 28.32    Physical Exam Vitals reviewed.  Constitutional:      Appearance: Normal appearance.  HENT:     Right Ear: Tympanic membrane normal.     Left Ear: Tympanic membrane normal.  Eyes:     Conjunctiva/sclera: Conjunctivae normal.     Pupils: Pupils are equal, round, and reactive to light.  Cardiovascular:     Rate and Rhythm: Normal rate and regular rhythm.     Pulses: Normal pulses.     Heart sounds: Normal heart sounds. No murmur heard. No gallop.   Pulmonary:     Effort: Pulmonary  effort is normal. No respiratory distress.     Breath sounds: Normal breath sounds. No rales.  Abdominal:     General: Abdomen is flat. Bowel sounds are normal.     Palpations: Abdomen is soft.  Musculoskeletal:        General: Normal range of motion.     Right lower leg: No edema.     Left lower leg: No edema.  Skin:    Capillary Refill: Capillary refill takes less than 2 seconds.  Comments: Left toes red and macerated, wet between toes.  Neurological:     General: No focal deficit present.     Mental Status: He is alert and oriented to person, place, and time. Mental status is at baseline.       Lab Results  Component Value Date   WBC 5.6 11/12/2020   HGB 10.6 (L) 11/12/2020   HCT 32.1 (L) 11/12/2020   PLT 172 11/12/2020   GLUCOSE 123 (H) 11/12/2020   CHOL 159 11/12/2020   TRIG 62 11/12/2020   HDL 69 11/12/2020   LDLCALC 78 11/12/2020   ALT 12 11/12/2020   AST 14 11/12/2020   NA 141 11/12/2020   K 4.1 11/12/2020   CL 101 11/12/2020   CREATININE 1.66 (H) 11/12/2020   BUN 37 (H) 11/12/2020   CO2 23 11/12/2020   TSH 0.713 08/05/2014   INR 3.22 (H) 08/04/2014   HGBA1C 7.0 (H) 10/05/2020   MICROALBUR 30 05/17/2020      Assessment & Plan:   Diagnoses and all orders for this visit: Tinea pedis of left foot -     tolnaftate (TINACTIN) 1 % powder; Apply 1 application topically 2 (two) times daily. Tinea pedis left toes, keep dry and use spray  Cellulitis of right lower extremity The redness of legs have resolved and the edema resolved.  Stop unna boots  Persistent atrial fibrillation ([place]) Patient has a diagnosis of permanent atrial fibrillation.   Patient is on no anticoagulant due to fall risk and has controlled ventricular response.  Patient is CV stable . Essential hypertension -     CBC with Differential/Platelet -     Comprehensive metabolic panel An individual hypertension care plan was established and reinforced today.  The patient's status was  assessed using clinical findings on exam and labs or diagnostic tests. The patient's success at meeting treatment goals on disease specific evidence-based guidelines and found to be well controlled. SELF MANAGEMENT: The patient and I together assessed ways to personally work towards obtaining the recommended goals. RECOMMENDATIONS: avoid decongestants found in common cold remedies, decrease consumption of alcohol, perform routine monitoring of BP with home BP cuff, exercise, reduction of dietary salt, take medicines as prescribed, try not to miss doses and quit smoking.  Regular exercise and maintaining a healthy weight is needed.  Stress reduction may help. A CLINICAL SUMMARY including written plan identify barriers to care unique to individual due to social or financial issues.  We attempt to mutually creat solutions for individual and family understanding.  Diabetes mellitus type 2 with peripheral artery disease ([place]) An individual care plan for diabetes was established and reinforced today.  The patient's status was assessed using clinical findings on exam, labs and diagnostic testing. Patient success at meeting goals based on disease specific evidence-based guidelines and found to be fair controlled. Medications were assessed and patient's understanding of the medical issues , including barriers were assessed. Recommend adherence to a diabetic diet, a graduated exercise program, HgbA1c level is checked quarterly, and urine microalbumin performed yearly .  Annual mono-filament sensation testing performed. Lower blood pressure and control hyperlipidemia is important. Get annual eye exams and annual flu shots and smoking cessation discussed.  Self management goals were discussed.  Diabetic glomerulopathy ([place]) AN INDIVIDUAL CARE PLAN for glomerulopaty was established and reinforced today.  The patient's status was assessed using clinical findings on exam, labs, and other diagnostic testing. Patient's  success at meeting treatment goals based on disease specific evidence-bassed guidelines and found  to be in good control. RECOMMENDATIONS include maintaining present medicines and treatment.  CKD (chronic kidney disease) stage 4, GFR 15-29 ml/min ([facility]) AN INDIVIDUAL CARE PLAN for stge 4 renal disease was established and reinforced today.  The patient's status was assessed using clinical findings on exam, labs, and other diagnostic testing. Patient's success at meeting treatment goals based on disease specific evidence-bassed guidelines and found to be in fair control. RECOMMENDATIONS include maintaining present medicines and treatment.  Benign prostatic hyperplasia without lower urinary tract symptoms AN INDIVIDUAL CARE PLAN for BPH was established and reinforced today.  The patient's status was assessed using clinical findings on exam, labs, and other diagnostic testing. Patient's success at meeting treatment goals based on disease specific evidence-bassed guidelines and found to be in good control. RECOMMENDATIONS include maintaining present medicines and treatment.  Mixed hyperlipidemia -     Lipid panel AN INDIVIDUAL CARE PLAN for hyperlipidemia/ cholesterol was established and reinforced today.  The patient's status was assessed using clinical findings on exam, lab and other diagnostic tests. The patient's disease status was assessed based on evidence-based guidelines and found to be fair controlled. MEDICATIONS were reviewed. SELF MANAGEMENT GOALS have been discussed and patient's success at attaining the goal of low cholesterol was assessed. RECOMMENDATION given include regular exercise 3 days a week and low cholesterol/low fat diet. CLINICAL SUMMARY including written plan to identify barriers unique to the patient due to social or economic  reasons was discussed. Other orders -     Cardiovascular Risk Assessment    30 minute visit    Follow-up: Return in about 3 months (around  02/12/2021) for fasting.  An After Visit Summary was printed and given to the patient.  Reinaldo Meeker, MD [facility] [phone]

## 2020-11-13 LAB — COMPREHENSIVE METABOLIC PANEL
ALT: 12 IU/L (ref 0–44)
AST: 14 IU/L (ref 0–40)
Albumin/Globulin Ratio: 1.5 (ref 1.2–2.2)
Albumin: 4.3 g/dL (ref 3.6–4.6)
Alkaline Phosphatase: 132 IU/L — ABNORMAL HIGH (ref 44–121)
BUN/Creatinine Ratio: 22 (ref 10–24)
BUN: 37 mg/dL — ABNORMAL HIGH (ref 8–27)
Bilirubin Total: 0.8 mg/dL (ref 0.0–1.2)
CO2: 23 mmol/L (ref 20–29)
Calcium: 10 mg/dL (ref 8.6–10.2)
Chloride: 101 mmol/L (ref 96–106)
Creatinine, Ser: 1.66 mg/dL — ABNORMAL HIGH (ref 0.76–1.27)
Globulin, Total: 2.8 g/dL (ref 1.5–4.5)
Glucose: 123 mg/dL — ABNORMAL HIGH (ref 65–99)
Potassium: 4.1 mmol/L (ref 3.5–5.2)
Sodium: 141 mmol/L (ref 134–144)
Total Protein: 7.1 g/dL (ref 6.0–8.5)
eGFR: 40 mL/min/{1.73_m2} — ABNORMAL LOW (ref >59)

## 2020-11-13 LAB — CBC WITH DIFFERENTIAL/PLATELET
Basophils Absolute: 0.1 10*3/uL (ref 0.0–0.2)
Basos: 1 %
EOS (ABSOLUTE): 0.2 10*3/uL (ref 0.0–0.4)
Eos: 3 %
Hematocrit: 32.1 % — ABNORMAL LOW (ref 37.5–51.0)
Hemoglobin: 10.6 g/dL — ABNORMAL LOW (ref 13.0–17.7)
Immature Grans (Abs): 0 10*3/uL (ref 0.0–0.1)
Immature Granulocytes: 1 %
Lymphocytes Absolute: 1.1 10*3/uL (ref 0.7–3.1)
Lymphs: 19 %
MCH: 28.6 pg (ref 26.6–33.0)
MCHC: 33 g/dL (ref 31.5–35.7)
MCV: 87 fL (ref 79–97)
Monocytes Absolute: 0.5 10*3/uL (ref 0.1–0.9)
Monocytes: 8 %
Neutrophils Absolute: 3.7 10*3/uL (ref 1.4–7.0)
Neutrophils: 68 %
Platelets: 172 10*3/uL (ref 150–450)
RBC: 3.71 x10E6/uL — ABNORMAL LOW (ref 4.14–5.80)
RDW: 15.2 % (ref 11.6–15.4)
WBC: 5.6 10*3/uL (ref 3.4–10.8)

## 2020-11-13 LAB — LIPID PANEL
Chol/HDL Ratio: 2.3 ratio (ref 0.0–5.0)
Cholesterol, Total: 159 mg/dL (ref 100–199)
HDL: 69 mg/dL (ref >39)
LDL Chol Calc (NIH): 78 mg/dL (ref 0–99)
Triglycerides: 62 mg/dL (ref 0–149)
VLDL Cholesterol Cal: 12 mg/dL (ref 5–40)

## 2020-11-13 LAB — CARDIOVASCULAR RISK ASSESSMENT

## 2020-11-13 NOTE — Progress Notes (Signed)
Anemia improved, glucose 123, kidney tests stage 3b, liver tests normal, Cholesterol normal,  lp

## 2020-11-16 ENCOUNTER — Telehealth: Payer: Self-pay

## 2020-11-16 NOTE — Telephone Encounter (Signed)
Pt's POA niece, Glen Lee calling questioning pt's renal stage. Glen Lee did not realize pts renal function was in this state. Made niece aware since December pt has been in stage 3a until last labs where it was stage 3b. Glen Lee VU. Asked what next step is, does pt see nephrology now?   Royce Macadamia, [place] 11/16/20 2:00 PM

## 2020-11-16 NOTE — Telephone Encounter (Signed)
Patient needs renal referral for 3b renal disease lp

## 2020-11-17 ENCOUNTER — Other Ambulatory Visit: Payer: Self-pay

## 2020-11-17 DIAGNOSIS — N184 Chronic kidney disease, stage 4 (severe): Secondary | ICD-10-CM

## 2020-11-17 NOTE — Telephone Encounter (Signed)
Patient POA made aware of information, verbalized understanding. Referral sent.

## 2020-11-23 ENCOUNTER — Ambulatory Visit (INDEPENDENT_AMBULATORY_CARE_PROVIDER_SITE_OTHER): Payer: Medicare Other

## 2020-11-23 DIAGNOSIS — I4819 Other persistent atrial fibrillation: Secondary | ICD-10-CM | POA: Diagnosis not present

## 2020-11-23 DIAGNOSIS — E1151 Type 2 diabetes mellitus with diabetic peripheral angiopathy without gangrene: Secondary | ICD-10-CM | POA: Diagnosis not present

## 2020-11-23 DIAGNOSIS — L03115 Cellulitis of right lower limb: Secondary | ICD-10-CM

## 2020-11-23 DIAGNOSIS — L03116 Cellulitis of left lower limb: Secondary | ICD-10-CM

## 2020-11-23 NOTE — Patient Instructions (Signed)
Visit Information  PATIENT GOALS:  Goals Addressed             This Visit's Progress    Manage My Medicine       Timeframe:  Long-Range Goal Priority:  High Start Date:        10/19/2020                    Expected End Date:    01/19/2021                   Follow Up Date 11/23/2020   - continue to use a pill box to sort medication and patient will add another 1/2 tablet of lasix to my daily pill planner - Take my medications as prescribed.  -I will call my doctor for increase swelling or redness in my legs.            Monitor and Manage My Blood Sugar-Diabetes Type 2       Timeframe:  Long-Range Goal Priority:  Medium Start Date:        10/19/2020                     Expected End Date: 01/19/2021             Follow Up Date:  01/25/21  - refused meter. States his blood sugar is always good.  - I will call the doctor for any changes in my condition. - I will tell my niece if I don't feel good.           The patient verbalized understanding of instructions, educational materials, and care plan provided today and declined offer to receive copy of patient instructions, educational materials, and care plan.   Telephone follow up appointment with care management team member scheduled for: 01/25/2021  Signature Tomasa Rand, RN, BSN, CEN RN Case Manager [facility] [phone]

## 2020-11-23 NOTE — Chronic Care Management (AMB) (Signed)
Chronic Care Management   CCM RN Visit Note  11/23/2020 Name: [name] MRN: [id number] DOB: July 11, 1933  Subjective: Glen Lee is a 87 y.o. year old male who is a primary care patient of [name], MD. The care management team was consulted for assistance with disease management and care coordination needs.    Engaged with patient by telephone for follow up visit in response to provider referral for case management and/or care coordination services.   Consent to Services:  The patient was given information about Chronic Care Management services, agreed to services, and gave verbal consent prior to initiation of services.  Please see initial visit note for detailed documentation.   Patient agreed to services and verbal consent obtained.   Assessment: Review of patient past medical history, allergies, medications, health status, including review of consultants reports, laboratory and other test data, was performed as part of comprehensive evaluation and provision of chronic care management services.   SDOH (Social Determinants of Health) assessments and interventions performed:    CCM Care Plan  Allergies  Allergen Reactions   Clindamycin/Lincomycin Diarrhea    bloody   Doxycycline    Sulfa Antibiotics Other (See Comments)   Torsemide    Keflet [Cephalexin] Rash    Outpatient Encounter Medications as of 11/23/2020  Medication Sig   acetaminophen (TYLENOL) 500 MG tablet Take 1-2 tablets (500-1,000 mg total) by mouth every 6 (six) hours as needed.   Blood Glucose Monitoring Suppl ([place]) w/Device KIT 1 each by Does not apply route daily.   diclofenac Sodium (VOLTAREN) 1 % GEL SMARTSIG:2 Gram(s) Topical 1 to 4 Times Daily   diltiazem (CARDIZEM CD) 120 MG 24 hr capsule TAKE 1 CAPSULE BY MOUTH EVERY DAY   ferrous sulfate 325 (65 FE) MG tablet Take 325 mg by mouth daily with breakfast.   finasteride (PROSCAR) 5 MG tablet TAKE 1 TABLET BY  MOUTH EVERY DAY   furosemide (LASIX) 20 MG tablet Take 2 tablets (40 mg total) by mouth daily.   glucose blood (ONETOUCH VERIO) test strip Use as instructed   ketoconazole (NIZORAL) 2 % shampoo USE AS DIRECTED, ROTATE BETWEEN OVER THE COUNTER SHAMPOOS   Lancets (ONETOUCH ULTRASOFT) lancets Use as instructed   Mesalamine 800 MG TBEC TAKE 1 TABLET BY MOUTH TWICE A DAY   pantoprazole (PROTONIX) 20 MG tablet TAKE 1 TABLET BY MOUTH EVERY DAY AS DIRECTED   simvastatin (ZOCOR) 20 MG tablet TAKE 1 TABLET BY MOUTH EVERY DAY   tamsulosin (FLOMAX) 0.4 MG CAPS capsule TAKE 1 CAPSULE BY MOUTH EVERY DAY   tolnaftate (TINACTIN) 1 % powder Apply 1 application topically 2 (two) times daily.   TRADJENTA 5 MG TABS tablet TAKE 1 TABLET BY MOUTH EVERY DAY   traZODone (DESYREL) 100 MG tablet TAKE 1 TABLET BY MOUTH AT NIGHT   triamcinolone cream (KENALOG) 0.1 % APPLY TO AFFECTED AREA TWICE A DAY   No facility-administered encounter medications on file as of 11/23/2020.    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   Mixed hyperlipidemia    Essential hypertension    Diabetes  mellitus type 2 with peripheral artery disease (HCC)    Benign prostate hyperplasia    Hammer toes of both feet 03/21/2017   Right ankle joint deformity 03/21/2017   Ankle fracture 06/29/2016   Varicose veins of lower extremities with complications 24/40/1027   Chronic venous insufficiency 05/14/2015   PAF (paroxysmal atrial fibrillation) ([place]) 09/16/2014   Persistent atrial fibrillation (HCC)    Hypotension 08/04/2014   CKD (chronic kidney disease) stage 4, GFR  15-29 ml/min ([place]) 08/04/2014    Conditions to be addressed/monitored:DMII, cellulitis  Care Plan : Cellulitis- ineffective management of medication to help reduce fluid overload in lower extremities  Updates made by Thana Ates, RN since 11/23/2020 12:00 AM     Problem: Medication Adherence ([facility])      Long-Range Goal: Medication Adherence Maintained   Start Date: 10/19/2020  Expected End Date: 01/18/2021  Recent Progress: On track  Priority: High  Note:    Current Barriers:  Ineffective Self Health Maintenance- Patient reports he is taking all medications as prescribed. Including the lasix.  Unable to self administer medications as prescribed: currently reports taking medications as prescribed. Denies swelling today or wounds.  Clinical Goal(s):  Collaboration with [name], MD regarding development and update of comprehensive plan of care as evidenced by provider attestation and co-signature Inter-disciplinary care team collaboration (see longitudinal plan of care) patient will work with care management team to address care coordination and chronic disease management needs related to Medication Management and Education   Interventions:  Evaluation of current treatment plan related to DMII, fluid overload and self-management and patient's adherence to plan as established by provider. Collaboration with [name], MD regarding development and update of comprehensive plan of care as evidenced by provider attestation       and co-signature Inter-disciplinary care team collaboration (see longitudinal plan of care) Discussed plans with patient for ongoing care management follow up and provided patient with direct contact information for care management team Discussed and reviewed with patient the reason for taking Lasix and the importance of not skipping or self reducing medications. Reviewed progress of no swelling at this time and that his legs are healed.   Again reviewed the signs and symptoms of infection and encouraged patient to call MD office at the onset of increased swelling, pain, redness or drainage.  Self Care Activities:  Patient verbalizes understanding of plan to take Lasix as prescribed Self administers medications as prescribed using the aid of a pill box.  Attends all scheduled provider appointments Calls provider office for new concerns or questions Patient Goals: - continue to use a pill box to sort medication and patient will add another 1/2 tablet of lasix to my daily pill planner - Take my medications as prescribed.  -I will call my doctor for increase swelling or redness in my legs.   Follow Up Plan: Telephone follow up appointment with care management team member scheduled for: 01/25/21    Care Plan : Diabetes Type 2 - Patient will self monitor weekly as per MD recommendation  Updates made by Thana Ates, RN since 11/23/2020 12:00 AM     Problem: Disease Progression (Diabetes, Type 2)   Priority: Medium  Onset Date: 10/19/2020  Note:   Objective:  Lab Results  Component Value Date   HGBA1C 7.0 (H) 10/05/2020   Lab Results  Component Value Date   CREATININE 1.47 (H) 10/05/2020   CREATININE 1.46 (H) 05/17/2020   CREATININE 1.34 (H) 02/09/2020  Lab Results  Component Value Date   EGFR 47 (L) 10/05/2020   Current Barriers:  Does not have glucometer to monitor blood sugar-  patient reports no CBG meter or CBG supplies due to cost a long time ago.  Does not adhere to provider recommendations re:  MD recommends patient self monitor weekly and record  readings.  Does not adhere to prescribed medication regimen: Patient not taking Lasix as prescribed Case Manager Clinical Goal(s):  patient will demonstrate improved adherence to prescribed treatment plan for diabetes self care/management as evidenced by: adherence to prescribed medication regimen, contacting provider for new or worsened symptoms or  questions. Patient will self monitor weekly and record on calendar.  Interventions:  Collaboration with [name], MD regarding development and update of comprehensive plan of care as evidenced by provider attestation and co-signature Inter-disciplinary care team collaboration (see longitudinal plan of care) Reviewed medications with patient and discussed importance of medication adherence Discussed plans with patient for ongoing care management follow up and provided patient with direct contact information for care management team Reviewed scheduled/upcoming provider appointments including: this case manager follow up on 11/23/2020 Advised patient, providing education and rationale, to check cbg weekly and record, calling MD office for findings outside established parameters.   Review of patient status, including review of consultants reports, relevant laboratory and other test results, and medications completed. Reviewed Hgb A1c results and goals.  Spoke with MD about recommendations for monitoring and requested CBG and supply orders be sent to the pharmacy. Self-Care Activities - Self administers oral medications as prescribed Attends all scheduled provider appointments Patient Goals: - check blood sugar at prescribed times - take the blood sugar log to all doctor visits - take the blood sugar meter to all doctor visits  Follow Up Plan: Telephone follow up appointment with care management team member scheduled for: 11/23/2020    Long-Range Goal: Monitor and Manage My Blood Sugar-Diabetes Type 2   Start Date: 10/19/2020  Recent Progress: On track  Priority: Medium  Note:   Timeframe:  Long-Range Goal Priority:  Medium Start Date:      10/19/2020                       Expected End Date:       01/18/2021                Follow Up Date 01/25/2021    Objective:  Lab Results  Component Value Date   HGBA1C 7.0 (H) 10/05/2020   Lab Results  Component Value Date   CREATININE  1.47 (H) 10/05/2020   CREATININE 1.46 (H) 05/17/2020   CREATININE 1.34 (H) 02/09/2020   Lab Results  Component Value Date   EGFR 47 (L) 10/05/2020  Current Barriers:  Does not have glucometer to monitor blood sugar- patient reports he does not want a meter and does not want to self monitor. Does not adhere to provider recommendations:  refuses meter Does not adhere to prescribed medication regimen- currently is taking medications as prescribed.  Case Manager Clinical Goal(s):  patient will demonstrate improved adherence to prescribed treatment plan for diabetes self care/management as evidenced by: adherence to prescribed medication regimen contacting provider for new or worsened symptoms or questions.  Encouraged patient to report any new signs of infection to MD immediately. Interventions:  Collaboration with [name], MD regarding development and update of comprehensive plan of care as evidenced by provider attestation and co-signature Inter-disciplinary care team collaboration (  see longitudinal plan of care) Reviewed medications with patient and discussed importance of medication adherence Requested MD send RX to local pharmacy. Reviewed signs and symptoms of high and low blood sugar.  Encouraged patient to eat regular meals and stay hydrated.  Self-Care Activities - Checks blood sugars as prescribed and utilize hyper and hypoglycemia protocol as needed Patient Goals: - refused meter. States his blood sugar is always good.  - I will call the doctor for any changes in my condition. - I will tell my niece if I don't feel good.  Follow Up Plan: Telephone follow up appointment with care management team member scheduled for: 01/25/2021         Plan:Telephone follow up appointment with care management team member scheduled for:  01/25/2021 SIG Tomasa Rand, RN, BSN, CEN RN Case Manager  [facility] [phone]

## 2020-11-29 ENCOUNTER — Other Ambulatory Visit: Payer: Self-pay

## 2020-11-29 ENCOUNTER — Ambulatory Visit: Payer: Medicare Other | Admitting: Podiatry

## 2020-11-29 DIAGNOSIS — E1151 Type 2 diabetes mellitus with diabetic peripheral angiopathy without gangrene: Secondary | ICD-10-CM | POA: Diagnosis not present

## 2020-11-29 DIAGNOSIS — N184 Chronic kidney disease, stage 4 (severe): Secondary | ICD-10-CM

## 2020-11-29 DIAGNOSIS — M2041 Other hammer toe(s) (acquired), right foot: Secondary | ICD-10-CM

## 2020-11-29 DIAGNOSIS — M2042 Other hammer toe(s) (acquired), left foot: Secondary | ICD-10-CM

## 2020-11-29 DIAGNOSIS — I4819 Other persistent atrial fibrillation: Secondary | ICD-10-CM | POA: Diagnosis not present

## 2020-11-29 NOTE — Progress Notes (Signed)
  Subjective:  Patient ID: Glen Lee, male    DOB: [birth year],  MRN: [id number]  Chief Complaint  Patient presents with   Hammer Toe    F/U Lt 2nd-3rd toes HT -pt states,: they're sore and tough to walk     DOS: 07/21/19 Procedure: Flexor tenotomy Left 2nd/3rd toes  87 y.o. male presents with the above complaint. History confirmed with patient.   Objective:  Physical Exam: tenderness at the surgical site and no edema noted. Incision: healing well. Toes slight contracture, rigid, but callus facing out rather than down Assessment:   1. Hammer toes of both feet   2. Diabetes mellitus type 2 with peripheral artery disease ([place])   3. CKD (chronic kidney disease) stage 4, GFR 15-29 ml/min (HCC)   4. Persistent atrial fibrillation ([place])    Plan:  Patient was evaluated and treated and all questions answered.  Post-operative State -Still with bony contracture -Patient has failed all conservative therapy and wishes to proceed with surgical intervention. All risks, benefits, and alternatives discussed with patient. No guarantees given. Consent reviewed and signed by patient. -Planned procedures: left 2nd/3rd toe hammertoe correction -ASA 3 - Patient with moderate systemic disease with functional limitations; Risk factors: DM, CKD, PAF -Post-op anticoagulation: chemoprophylaxis not indicated -DME dispensed for post-op use: [facility]  Return for Post-op Care.

## 2020-12-06 ENCOUNTER — Other Ambulatory Visit: Payer: Self-pay

## 2020-12-06 ENCOUNTER — Encounter: Payer: Self-pay | Admitting: Legal Medicine

## 2020-12-06 ENCOUNTER — Ambulatory Visit: Payer: Medicare Other | Admitting: Legal Medicine

## 2020-12-06 VITALS — BP 136/62 | HR 61 | Temp 98.1°F | Resp 16 | Ht 70.0 in | Wt 198.0 lb

## 2020-12-06 DIAGNOSIS — I83893 Varicose veins of bilateral lower extremities with other complications: Secondary | ICD-10-CM | POA: Diagnosis not present

## 2020-12-06 DIAGNOSIS — R600 Localized edema: Secondary | ICD-10-CM | POA: Insufficient documentation

## 2020-12-06 NOTE — Progress Notes (Signed)
Established Patient Office Visit  Subjective:  Patient ID: Glen Lee, male    DOB: 05-22-1934  Age: 87 y.o. MRN: [id number]  CC:  Chief Complaint  Patient presents with   Leg Swelling    Patient noticed right leg swelling.    HPI Glen Lee presents for recurrent edema right leg since  yesterday.  It I red and 4+ edema.  Left leg looks normal  Past Medical History:  Diagnosis Date   Anemia    Arthritis    Benign prostate hyperplasia    Diabetes mellitus without complication ([place])    Edema    right leg   Enlarged prostate    Esophageal reflux    Essential hypertension    Hyperlipidemia    Hypertension    Mixed hyperlipidemia    Tremor    right hand   Type 2 diabetes mellitus with other specified complication (HCC)    Vitamin D deficiency     Past Surgical History:  Procedure Laterality Date   JOINT REPLACEMENT Left    knee   KNEE SURGERY     ORIF ANKLE FRACTURE Right 06/30/2016   Procedure: OPEN REDUCTION INTERNAL FIXATION (ORIF) ANKLE FRACTURE;  Surgeon: Altamese Lebanon, MD;  Location: [facility];  Service: Orthopedics;  Laterality: Right;    Family History  Problem Relation Age of Onset   Hyperlipidemia Mother    Diabetes Mother    Colon cancer Father    Diabetes Father     Social History   Socioeconomic History   Marital status: Single    Spouse name: Not on file   Number of children: 3   Years of education: Not on file   Highest education level: Not on file  Occupational History   Not on file  Tobacco Use   Smoking status: Former    Years: 20.00    Pack years: 0.00    Types: Cigarettes    Quit date: 08/04/1992    Years since quitting: 28.3   Smokeless tobacco: Never  Vaping Use   Vaping Use: Never used  Substance and Sexual Activity   Alcohol use: Not Currently    Alcohol/week: 0.0 standard drinks   Drug use: Never   Sexual activity: Not Currently  Other Topics Concern   Not on file  Social History Narrative   Concern for pt  ability to care for himself. Pt refused placement post hospitalization after rehab   Social Determinants of Health   Financial Resource Strain: Not on file  Food Insecurity: Not on file  Transportation Needs: Not on file  Physical Activity: Not on file  Stress: Not on file  Social Connections: Not on file  Intimate Partner Violence: Not on file    Outpatient Medications Prior to Visit  Medication Sig Dispense Refill   acetaminophen (TYLENOL) 500 MG tablet Take 1-2 tablets (500-1,000 mg total) by mouth every 6 (six) hours as needed. 60 tablet 0   Blood Glucose Monitoring Suppl ([place]) w/Device KIT 1 each by Does not apply route daily. 1 kit 0   diclofenac Sodium (VOLTAREN) 1 % GEL SMARTSIG:2 Gram(s) Topical 1 to 4 Times Daily     diltiazem (CARDIZEM CD) 120 MG 24 hr capsule TAKE 1 CAPSULE BY MOUTH EVERY DAY 90 capsule 2   ferrous sulfate 325 (65 FE) MG tablet Take 325 mg by mouth daily with breakfast.     finasteride (PROSCAR) 5 MG tablet TAKE 1 TABLET BY MOUTH EVERY DAY 90 tablet  2   furosemide (LASIX) 20 MG tablet Take 2 tablets (40 mg total) by mouth daily. 1 tablet 0   glucose blood (ONETOUCH VERIO) test strip Use as instructed 100 each 12   ketoconazole (NIZORAL) 2 % shampoo USE AS DIRECTED, ROTATE BETWEEN OVER THE COUNTER SHAMPOOS     Lancets (ONETOUCH ULTRASOFT) lancets Use as instructed 100 each 12   Mesalamine 800 MG TBEC TAKE 1 TABLET BY MOUTH TWICE A DAY 180 tablet 2   pantoprazole (PROTONIX) 20 MG tablet TAKE 1 TABLET BY MOUTH EVERY DAY AS DIRECTED 90 tablet 3   simvastatin (ZOCOR) 20 MG tablet TAKE 1 TABLET BY MOUTH EVERY DAY 90 tablet 2   tamsulosin (FLOMAX) 0.4 MG CAPS capsule TAKE 1 CAPSULE BY MOUTH EVERY DAY 90 capsule 2   tolnaftate (TINACTIN) 1 % powder Apply 1 application topically 2 (two) times daily. 45 g 3   TRADJENTA 5 MG TABS tablet TAKE 1 TABLET BY MOUTH EVERY DAY 90 tablet 3   traZODone (DESYREL) 100 MG tablet TAKE 1 TABLET BY MOUTH AT  NIGHT 90 tablet 2   triamcinolone cream (KENALOG) 0.1 % APPLY TO AFFECTED AREA TWICE A DAY 454 g 3   No facility-administered medications prior to visit.    Allergies  Allergen Reactions   Clindamycin/Lincomycin Diarrhea    bloody   Doxycycline    Sulfa Antibiotics Other (See Comments)   Torsemide    Keflet [Cephalexin] Rash    ROS Review of Systems  Constitutional:  Negative for chills, fatigue and fever.  HENT:  Negative for congestion, ear pain and sore throat.   Respiratory:  Negative for cough and shortness of breath.   Cardiovascular:  Negative for chest pain.  Gastrointestinal:  Negative for abdominal pain, constipation, diarrhea, nausea and vomiting.  Endocrine: Negative for polydipsia, polyphagia and polyuria.  Genitourinary:  Negative for dysuria and frequency.  Musculoskeletal:  Negative for arthralgias and myalgias.  Neurological:  Negative for dizziness and headaches.  Psychiatric/Behavioral:  Negative for dysphoric mood.        No dysphoria     Objective:    Physical Exam Vitals reviewed.  Constitutional:      Appearance: Normal appearance.  HENT:     Right Ear: Tympanic membrane, ear canal and external ear normal.     Left Ear: Tympanic membrane, ear canal and external ear normal.     Mouth/Throat:     Mouth: Mucous membranes are moist.     Pharynx: Oropharynx is clear.  Eyes:     Extraocular Movements: Extraocular movements intact.     Conjunctiva/sclera: Conjunctivae normal.     Pupils: Pupils are equal, round, and reactive to light.  Cardiovascular:     Rate and Rhythm: Normal rate.     Pulses: Normal pulses.     Heart sounds: Normal heart sounds. No murmur heard.   No gallop.  Pulmonary:     Effort: Pulmonary effort is normal. No respiratory distress.     Breath sounds: Normal breath sounds. No wheezing.  Abdominal:     General: Abdomen is flat. Bowel sounds are normal. There is no distension.     Palpations: Abdomen is soft.      Tenderness: There is no abdominal tenderness.  Musculoskeletal:     Right lower leg: Edema present.  Skin:    General: Skin is warm.  Neurological:     General: No focal deficit present.     Mental Status: He is alert and oriented to person, place,  and time.    BP 136/62   Pulse 61   Temp 98.1 F (36.7 C)   Resp 16   Ht 5' 10"  (1.778 m)   Wt 198 lb (89.8 kg)   SpO2 98%   BMI 28.41 kg/m  Wt Readings from Last 3 Encounters:  12/06/20 198 lb (89.8 kg)  11/12/20 180 lb (81.6 kg)  11/05/20 192 lb (87.1 kg)     Health Maintenance Due  Topic Date Due   COVID-19 Vaccine (1) Never done   OPHTHALMOLOGY EXAM  Never done   Zoster Vaccines- Shingrix (1 of 2) Never done    There are no preventive care reminders to display for this patient.  Lab Results  Component Value Date   TSH 0.713 08/05/2014   Lab Results  Component Value Date   WBC 5.6 11/12/2020   HGB 10.6 (L) 11/12/2020   HCT 32.1 (L) 11/12/2020   MCV 87 11/12/2020   PLT 172 11/12/2020   Lab Results  Component Value Date   NA 141 11/12/2020   K 4.1 11/12/2020   CO2 23 11/12/2020   GLUCOSE 123 (H) 11/12/2020   BUN 37 (H) 11/12/2020   CREATININE 1.66 (H) 11/12/2020   BILITOT 0.8 11/12/2020   ALKPHOS 132 (H) 11/12/2020   AST 14 11/12/2020   ALT 12 11/12/2020   PROT 7.1 11/12/2020   ALBUMIN 4.3 11/12/2020   CALCIUM 10.0 11/12/2020   ANIONGAP 8 07/03/2016   EGFR 40 (L) 11/12/2020   Lab Results  Component Value Date   CHOL 159 11/12/2020   Lab Results  Component Value Date   HDL 69 11/12/2020   Lab Results  Component Value Date   LDLCALC 78 11/12/2020   Lab Results  Component Value Date   TRIG 62 11/12/2020   Lab Results  Component Value Date   CHOLHDL 2.3 11/12/2020   Lab Results  Component Value Date   HGBA1C 7.0 (H) 10/05/2020      Assessment & Plan:   Diagnoses and all orders for this visit: Varicose veins of both lower extremities with complications Patient has chronic varicose  veins both legs  Edema of right lower leg  Diffuse edema of right leg with some edema 4+, wrap with unna boot    Follow-up: Return in about 1 week (around 12/13/2020).    Reinaldo Meeker, MD

## 2020-12-10 ENCOUNTER — Ambulatory Visit: Payer: Medicare Other | Admitting: Legal Medicine

## 2020-12-14 ENCOUNTER — Ambulatory Visit (INDEPENDENT_AMBULATORY_CARE_PROVIDER_SITE_OTHER): Payer: Medicare Other | Admitting: Nurse Practitioner

## 2020-12-14 ENCOUNTER — Encounter: Payer: Self-pay | Admitting: Nurse Practitioner

## 2020-12-14 VITALS — BP 124/68 | Temp 98.0°F | Ht 70.0 in | Wt 199.0 lb

## 2020-12-14 DIAGNOSIS — R600 Localized edema: Secondary | ICD-10-CM

## 2020-12-14 DIAGNOSIS — I872 Venous insufficiency (chronic) (peripheral): Secondary | ICD-10-CM | POA: Diagnosis not present

## 2020-12-14 NOTE — Patient Instructions (Signed)
Continue medications Follow-up with Dr Henrene Pastor in 2 weeks We will call you with appointment for Ankle-Brachial Index test and possible referral to vascular doctor  Ankle-Brachial Index Test Why am I having this test? The ankle-brachial index (ABI) test is used to diagnose peripheral vascular disease (PVD). PVD is also known as peripheral arterial disease (PAD). PVD is the blocking or hardening of the arteries anywhere within the circulatory system beyond the heart. It is a form of cardiovascular disease. PVD is caused by: Cholesterol deposits in your blood vessels (atherosclerosis). This is the most common cause of this condition. Inflammation in the blood vessels. Blood clots in the vessels. Cholesterol deposits cause arteries to narrow. Decreased delivery of oxygen to your tissues causes muscle pain, cramping, and fatigue that occurs duringexercise and is relieved by rest. This is called intermittent claudication. PVD means that there may also be buildup of cholesterol: In your heart. This raises the risk of heart attacks. In your brain. This raises the risk of strokes. What is being tested? The ankle-brachial index test measures the blood flow in your arms and legs. The blood flow will show if blood vessels in your legs have been narrowed bycholesterol deposits. How do I prepare for this test? Wear loose, comfortable clothing with shoes that are easy to remove. Do not use any products that contain nicotine or tobacco for at least 30 minutes before the test. These products include cigarettes, chewing tobacco, and vaping devices, such as e-cigarettes. Avoid caffeine, alcohol, and heavy activity an hour before the test. What happens during the test?  You will lie down in a resting position with your legs and arms at heart level. Your health care provider will use a blood pressure machine and a small ultrasound device (Doppler) to measure the systolic pressures on your upper arms and ankles.  Systolic pressure is the pressure inside your arteries when your heart pumps. Systolic pressures will be taken several times, and at several points, on both the ankle and the arm. Your health care provider may also measure your toe. Your health care provider will divide the highest systolic pressure of the ankle by the highest systolic pressure of the arm. The result is the ankle-brachial pressure ratio, or ABI. Sometimes this test will be repeated after you have exercised on a treadmill for 5 minutes. You may have leg pain during the exercise portion of the test if you suffer from PVD. If the index number drops after exercise, this may show that PVD is present. How are the results reported? Your test results will be reported as a value that shows the ratio of your ankle pressure to your arm pressure (ABI ratio). Your health care provider will compare your results to normal ranges that were established after testing a large group of people (reference ranges). These ranges may vary among labs and hospitals. For this test, an abnormal reading is typically considered, which is an ABIratio of 0.9 or less. What do the results mean? An ABI ratio that is below the reference range is considered abnormal and mayindicate PVD in the legs. Talk with your health care provider about what your results mean. Questions to ask your health care provider Ask your health care provider, or the department that is doing the test: When will my results be ready? How will I get my results? What are my treatment options? What other tests do I need? What are my next steps? Summary The ankle-brachial index (ABI) test is used to diagnose peripheral vascular  disease (PVD). PVD is also known as peripheral arterial disease (PAD). The ABI test measures the blood flow in your arms and legs. The highest systolic pressure of the ankle is divided by the highest systolic pressure of the arm. The result is the ABI ratio. An ABI ratio  of 0.9 or less is considered abnormal and may indicate PVD in the legs. This information is not intended to replace advice given to you by your health care provider. Make sure you discuss any questions you have with your healthcare provider. Document Revised: 12/01/2019 Document Reviewed: 12/01/2019 Elsevier Patient Education  2022 [place] An Louretta [place] boot is a type of bandage (dressing) for the foot and leg. The dressing is a gauze wrap that is soaked with a type of medicine called zinc oxide. The gauze may also include other lotions and medicines that help in wound healing, such as calamine. An Unna boot may be used to treat: Open sores (ulcers) on the foot, heel, or leg. Swelling from disorders that affect the veins or lymphatic system (lymphedema). Skin conditions such as chronic inflammation caused by poor blood flow (stasis dermatitis). The dressing is applied by a health care provider. The gauze is wrapped around your lower extremity in several layers, usually starting at the toes and going upward to the knee. A dry outer wrap goes over the medicated wrap for supportand compression.  Before applying the [employer], your health care provider will clean your leg and foot and may apply an antibiotic ointment. You may be asked to raise (elevate) your leg for a while to reduce swelling before the boot is applied. The boot will dry and harden after it is applied. The boot may need to be changed orreplaced about twice a week. Follow these instructions at home: [place] as told by your health care provider. You may need to wear a slipper or shoe over the boot that is one or two sizes larger than normal. Check the skin around the boot every day. Tell your health care provider about any concerns. Do not stick anything inside the boot to scratch your skin. Doing that increases your risk of infection. Keep your [employer] clean and dry. Check every day for signs of  infection. Check for: Redness, swelling, or pain in your foot or toes. Fluid or blood coming from the boot. Pus or a bad smell coming from the boot. Remove the boot and call your health care provider if you have signs of poor blood flow, such as: Your toes tingle or become numb. Your toes turn cold or turn blue or pale. Your toes are more swollen or painful. You are unable to move your toes. Activity You may walk with the boot once it has dried. Ask your health care provider how much walking is safe for you. Avoid sitting for a long time without moving. Get up to take short walks as told by your health care provider. This is important to improve blood flow. Bathing Do not take baths, swim, or use a hot tub until your health care provider approves. Ask your health care provider if you may take showers. If your health care provider approves a bath or a shower, do not let the Unna boot get wet. If you take a shower, cover the boot with a watertight covering. If you take a bath, keep your leg with the boot out of the tub. General instructions Keep your leg elevated above the  level of your heart while you are sitting or lying down. This will decrease swelling. Do not sit with your knee bent for long periods of time. Take over-the-counter and prescription medicines only as told by your health care provider. Do not use any products that contain nicotine or tobacco, such as cigarettes, e-cigarettes, and chewing tobacco. These can delay healing. If you need help quitting, ask your health care provider. Keep all follow-up visits as told by your health care provider. This is important. Contact a health care provider if: Your skin feels itchy inside the boot. You have a burning sensation, a rash, or itchy, red, swollen areas of skin (hives) in the boot area. You have a fever or chills. You have any signs of infection, such as: New redness, swelling, or pain. More fluid or blood coming from the  boot. Pus or a bad smell coming from the boot. You have increased numbness or pain in your foot or toes. You have any changes in skin color on your foot or toes, such as the skin turning blue or pale or developing patchy areas with spots. Your boot has been damaged or feels like it is no longer fitting properly. Summary An Louretta Parma boot is a type of bandage (dressing) system for the foot and leg. The dressing is a gauze wrap that is soaked with a type of medicine (zinc oxide) to treat foot, heel, or leg ulcers, swelling from disorders that affect the veins or lymphatic system (lymphedema), and skin conditions caused by poor blood flow (stasis dermatitis). This dressing is applied by a health care provider. After it is applied, the boot will dry and harden. The boot may need to be changed or replaced about twice a week. Let your health care provider know if you have any signs of poor blood flow or infection. This information is not intended to replace advice given to you by your health care provider. Make sure you discuss any questions you have with your healthcare provider. Document Revised: 09/17/2018 Document Reviewed: 02/06/2018 Elsevier Patient Education  2022 [place]. Peripheral Edema  Peripheral edema is swelling that is caused by a buildup of fluid. Peripheral edema most often affects the lower legs, ankles, and feet. It can also develop in the arms, hands, and face. The area of the body that has peripheral edema will look swollen. It may also feel heavy or warm. Your clothes may start to feel tight. Pressing on the area may make a temporary dent in your skin. Youmay not be able to move your swollen arm or leg as much as usual. There are many causes of peripheral edema. It can happen because of a complication of other conditions such as congestive heart failure, kidney disease, or a problem with your blood circulation. It also can be a side effect of certain medicines or because of an  infection. It often happens to womenduring pregnancy. Sometimes, the cause is not known. Follow these instructions at home: Managing pain, stiffness, and swelling  Raise (elevate) your legs while you are sitting or lying down. Move around often to prevent stiffness and to lessen swelling. Do not sit or stand for long periods of time. Wear support stockings as told by your health care provider.  Medicines Take over-the-counter and prescription medicines only as told by your health care provider. Your health care provider may prescribe medicine to help your body get rid of excess water (diuretic). General instructions Pay attention to any changes in your symptoms. Follow instructions from  your health care provider about limiting salt (sodium) in your diet. Sometimes, eating less salt may reduce swelling. Moisturize skin daily to help prevent skin from cracking and draining. Keep all follow-up visits as told by your health care provider. This is important. Contact a health care provider if you have: A fever. Edema that starts suddenly or is getting worse, especially if you are pregnant or have a medical condition. Swelling in only one leg. Increased swelling, redness, or pain in one or both of your legs. Drainage or sores at the area where you have edema. Get help right away if you: Develop shortness of breath, especially when you are lying down. Have pain in your chest or abdomen. Feel weak. Feel faint. Summary Peripheral edema is swelling that is caused by a buildup of fluid. Peripheral edema most often affects the lower legs, ankles, and feet. Move around often to prevent stiffness and to lessen swelling. Do not sit or stand for long periods of time. Pay attention to any changes in your symptoms. Contact a health care provider if you have edema that starts suddenly or is getting worse, especially if you are pregnant or have a medical condition. Get help right away if you develop  shortness of breath, especially when lying down. This information is not intended to replace advice given to you by your health care provider. Make sure you discuss any questions you have with your healthcare provider. Document Revised: 02/20/2018 Document Reviewed: 02/20/2018 Elsevier Patient Education  2022 [employer].

## 2020-12-14 NOTE — Progress Notes (Signed)
Subjective:  Patient ID: Glen Lee, male    DOB: Aug 31, 1933  Age: 87 y.o. MRN: [id number]  Chief Complaint  Patient presents with   Pedal Edema    Bilateral swelling and redness.   HPI Glen Lee is an 87 year old Caucasian male that presents with bilateral lower extremity edema and erythema. He has a long history of chronic venous insufficiency. He was seen by PCP on 12/06/20 for leg swelling. He tells me that he is scheduled to undergo left foot hammertoe repair next month.    Current Outpatient Medications on File Prior to Visit  Medication Sig Dispense Refill   acetaminophen (TYLENOL) 500 MG tablet Take 1-2 tablets (500-1,000 mg total) by mouth every 6 (six) hours as needed. 60 tablet 0   Blood Glucose Monitoring Suppl ([place]) w/Device KIT 1 each by Does not apply route daily. 1 kit 0   diclofenac Sodium (VOLTAREN) 1 % GEL SMARTSIG:2 Gram(s) Topical 1 to 4 Times Daily     diltiazem (CARDIZEM CD) 120 MG 24 hr capsule TAKE 1 CAPSULE BY MOUTH EVERY DAY 90 capsule 2   ferrous sulfate 325 (65 FE) MG tablet Take 325 mg by mouth daily with breakfast.     finasteride (PROSCAR) 5 MG tablet TAKE 1 TABLET BY MOUTH EVERY DAY 90 tablet 2   furosemide (LASIX) 20 MG tablet Take 2 tablets (40 mg total) by mouth daily. 1 tablet 0   glucose blood (ONETOUCH VERIO) test strip Use as instructed 100 each 12   ketoconazole (NIZORAL) 2 % shampoo USE AS DIRECTED, ROTATE BETWEEN OVER THE COUNTER SHAMPOOS     Lancets (ONETOUCH ULTRASOFT) lancets Use as instructed 100 each 12   Mesalamine 800 MG TBEC TAKE 1 TABLET BY MOUTH TWICE A DAY 180 tablet 2   pantoprazole (PROTONIX) 20 MG tablet TAKE 1 TABLET BY MOUTH EVERY DAY AS DIRECTED 90 tablet 3   simvastatin (ZOCOR) 20 MG tablet TAKE 1 TABLET BY MOUTH EVERY DAY 90 tablet 2   tamsulosin (FLOMAX) 0.4 MG CAPS capsule TAKE 1 CAPSULE BY MOUTH EVERY DAY 90 capsule 2   tolnaftate (TINACTIN) 1 % powder Apply 1 application topically 2 (two) times  daily. 45 g 3   TRADJENTA 5 MG TABS tablet TAKE 1 TABLET BY MOUTH EVERY DAY 90 tablet 3   traZODone (DESYREL) 100 MG tablet TAKE 1 TABLET BY MOUTH AT NIGHT 90 tablet 2   triamcinolone cream (KENALOG) 0.1 % APPLY TO AFFECTED AREA TWICE A DAY 454 g 3   No current facility-administered medications on file prior to visit.   Past Medical History:  Diagnosis Date   Anemia    Arthritis    Benign prostate hyperplasia    Diabetes mellitus without complication ([place])    Edema    right leg   Enlarged prostate    Esophageal reflux    Essential hypertension    Hyperlipidemia    Hypertension    Mixed hyperlipidemia    Tremor    right hand   Type 2 diabetes mellitus with other specified complication (HCC)    Vitamin D deficiency    Past Surgical History:  Procedure Laterality Date   JOINT REPLACEMENT Left    knee   KNEE SURGERY     ORIF ANKLE FRACTURE Right 06/30/2016   Procedure: OPEN REDUCTION INTERNAL FIXATION (ORIF) ANKLE FRACTURE;  Surgeon: Altamese Pasco, MD;  Location: [facility];  Service: Orthopedics;  Laterality: Right;    Family History  Problem Relation Age of  Onset   Hyperlipidemia Mother    Diabetes Mother    Colon cancer Father    Diabetes Father    Social History   Socioeconomic History   Marital status: Single    Spouse name: Not on file   Number of children: 3   Years of education: Not on file   Highest education level: Not on file  Occupational History   Not on file  Tobacco Use   Smoking status: Former    Years: 20.00    Pack years: 0.00    Types: Cigarettes    Quit date: 08/04/1992    Years since quitting: 28.3   Smokeless tobacco: Never  Vaping Use   Vaping Use: Never used  Substance and Sexual Activity   Alcohol use: Not Currently    Alcohol/week: 0.0 standard drinks   Drug use: Never   Sexual activity: Not Currently  Other Topics Concern   Not on file  Social History Narrative   Concern for pt ability to care for himself. Pt refused placement post  hospitalization after rehab   Social Determinants of Health   Financial Resource Strain: Not on file  Food Insecurity: Not on file  Transportation Needs: Not on file  Physical Activity: Not on file  Stress: Not on file  Social Connections: Not on file    Review of Systems  Constitutional:  Negative for appetite change, fatigue and fever.  HENT:  Positive for hearing loss. Negative for congestion, ear pain and sore throat.   Eyes:  Positive for visual disturbance.  Respiratory:  Negative for cough and shortness of breath.   Cardiovascular:  Positive for leg swelling (Bilateral swelling and redness). Negative for chest pain.  Gastrointestinal:  Negative for abdominal pain, constipation, diarrhea, nausea and vomiting.  Endocrine: Negative.   Genitourinary:  Negative for dysuria and frequency.  Musculoskeletal:  Positive for gait problem (ambulates with walker). Negative for arthralgias and myalgias.  Allergic/Immunologic: Negative.   Neurological:  Positive for weakness. Negative for dizziness and headaches.  Psychiatric/Behavioral:  Negative for dysphoric mood. The patient is not nervous/anxious.     Objective:  BP 124/68 (BP Location: Left Arm, Patient Position: Sitting)   Temp 98 F (36.7 C) (Temporal)   Ht _0  (1.778 m)   Wt 199 lb (90.3 kg)   SpO2 100%   BMI 28.55 kg/m   BP/Weight 12/14/2020 4/56/2563 01/18/3733  Systolic BP 287 681 157  Diastolic BP 68 62 58  Wt. (Lbs) 199 198 180  BMI 28.55 28.41 25.83    Physical Exam Vitals reviewed.  Constitutional:      Appearance: He is ill-appearing.  Eyes:     Pupils: Pupils are equal, round, and reactive to light.  Cardiovascular:     Rate and Rhythm: Normal rate. Rhythm irregular.     Pulses: Normal pulses.     Heart sounds: Normal heart sounds.  Pulmonary:     Effort: Pulmonary effort is normal.     Breath sounds: Normal breath sounds.  Abdominal:     General: Bowel sounds are normal.     Palpations: Abdomen is  soft.  Musculoskeletal:        General: Normal range of motion.     Right lower leg: 3+ Pitting Edema present.     Left lower leg: 2+ Edema present.  Skin:    General: Skin is warm and dry.     Capillary Refill: Capillary refill takes 2 to 3 seconds.     Findings: Erythema  present.  Neurological:     Mental Status: He is alert and oriented to person, place, and time. Mental status is at baseline.  Psychiatric:        Mood and Affect: Mood normal.        Behavior: Behavior normal.     Lab Results  Component Value Date   WBC 5.6 11/12/2020   HGB 10.6 (L) 11/12/2020   HCT 32.1 (L) 11/12/2020   PLT 172 11/12/2020   GLUCOSE 123 (H) 11/12/2020   CHOL 159 11/12/2020   TRIG 62 11/12/2020   HDL 69 11/12/2020   LDLCALC 78 11/12/2020   ALT 12 11/12/2020   AST 14 11/12/2020   NA 141 11/12/2020   K 4.1 11/12/2020   CL 101 11/12/2020   CREATININE 1.66 (H) 11/12/2020   BUN 37 (H) 11/12/2020   CO2 23 11/12/2020   TSH 0.713 08/05/2014   INR 3.22 (H) 08/04/2014   HGBA1C 7.0 (H) 10/05/2020   MICROALBUR 30 05/17/2020      Assessment & Plan:     1. Chronic venous insufficiency - Apply unna boot - Unna boot - VAS [place] ABI WITH/WO TBI; Future  2. Pedal edema - Apply unna boot - Unna boot - VAS [place] ABI WITH/WO TBI; Future    Continue medications Follow-up with Dr Henrene Pastor in 2 weeks We will call you with appointment for Ankle-Brachial Index test and possible referral to vascular doctor    Follow-up: 2-weeks  An After Visit Summary was printed and given to the patient.   I,Lauren M Auman,acting as a Education administrator for [employer], NP.,have documented all relevant documentation on the behalf of Rip Harbour, NP,as directed by  Rip Harbour, NP while in the presence of Rip Harbour, NP.   I, Rip Harbour, NP, have reviewed all documentation for this visit. The documentation on 12/14/20 for the exam, diagnosis, procedures, and orders are all accurate and complete.    Rip Harbour, NP [place] [phone]

## 2020-12-17 ENCOUNTER — Ambulatory Visit: Payer: Medicare Other | Admitting: Legal Medicine

## 2020-12-28 ENCOUNTER — Ambulatory Visit: Payer: Medicare Other | Admitting: Legal Medicine

## 2020-12-28 ENCOUNTER — Ambulatory Visit (INDEPENDENT_AMBULATORY_CARE_PROVIDER_SITE_OTHER): Payer: Medicare Other | Admitting: Podiatry

## 2020-12-28 ENCOUNTER — Encounter: Payer: Self-pay | Admitting: Legal Medicine

## 2020-12-28 ENCOUNTER — Other Ambulatory Visit: Payer: Self-pay

## 2020-12-28 ENCOUNTER — Telehealth: Payer: Self-pay | Admitting: Podiatry

## 2020-12-28 VITALS — BP 138/58 | HR 71 | Temp 97.1°F | Resp 16 | Ht 70.0 in | Wt 198.0 lb

## 2020-12-28 DIAGNOSIS — I83893 Varicose veins of bilateral lower extremities with other complications: Secondary | ICD-10-CM | POA: Diagnosis not present

## 2020-12-28 DIAGNOSIS — E1151 Type 2 diabetes mellitus with diabetic peripheral angiopathy without gangrene: Secondary | ICD-10-CM

## 2020-12-28 DIAGNOSIS — N184 Chronic kidney disease, stage 4 (severe): Secondary | ICD-10-CM

## 2020-12-28 DIAGNOSIS — I4819 Other persistent atrial fibrillation: Secondary | ICD-10-CM

## 2020-12-28 DIAGNOSIS — M2042 Other hammer toe(s) (acquired), left foot: Secondary | ICD-10-CM

## 2020-12-28 DIAGNOSIS — I96 Gangrene, not elsewhere classified: Secondary | ICD-10-CM

## 2020-12-28 DIAGNOSIS — M2041 Other hammer toe(s) (acquired), right foot: Secondary | ICD-10-CM

## 2020-12-28 NOTE — Progress Notes (Signed)
Established Patient Office Visit  Subjective:  Patient ID: Glen Lee, male    DOB: 1933-12-18  Age: 87 y.o. MRN: [id number]  CC:  Chief Complaint  Patient presents with   Leg Swelling    HPI Glen Lee presents for patient has chronic venous incompetence and was unna booted for 2 weeks.  All ulceration and drainage resolved.  His left 2nd and 3rd toe appear to be vascularly compromised with some gangrene on tip of 2nd toe. ABIs at hospital normal.  I discussed case with Dr. March Rummage.  He will see  Past Medical History:  Diagnosis Date   Anemia    Arthritis    Benign prostate hyperplasia    Diabetes mellitus without complication ([place])    Edema    right leg   Enlarged prostate    Esophageal reflux    Essential hypertension    Hyperlipidemia    Hypertension    Mixed hyperlipidemia    Tremor    right hand   Type 2 diabetes mellitus with other specified complication (HCC)    Vitamin D deficiency     Past Surgical History:  Procedure Laterality Date   JOINT REPLACEMENT Left    knee   KNEE SURGERY     ORIF ANKLE FRACTURE Right 06/30/2016   Procedure: OPEN REDUCTION INTERNAL FIXATION (ORIF) ANKLE FRACTURE;  Surgeon: Altamese Newark, MD;  Location: [facility];  Service: Orthopedics;  Laterality: Right;    Family History  Problem Relation Age of Onset   Hyperlipidemia Mother    Diabetes Mother    Colon cancer Father    Diabetes Father     Social History   Socioeconomic History   Marital status: Single    Spouse name: Not on file   Number of children: 3   Years of education: Not on file   Highest education level: Not on file  Occupational History   Not on file  Tobacco Use   Smoking status: Former    Years: 20.00    Types: Cigarettes    Quit date: 08/04/1992    Years since quitting: 28.4   Smokeless tobacco: Never  Vaping Use   Vaping Use: Never used  Substance and Sexual Activity   Alcohol use: Not Currently    Alcohol/week: 0.0 standard drinks   Drug  use: Never   Sexual activity: Not Currently  Other Topics Concern   Not on file  Social History Narrative   Concern for pt ability to care for himself. Pt refused placement post hospitalization after rehab   Social Determinants of Health   Financial Resource Strain: Not on file  Food Insecurity: Not on file  Transportation Needs: Not on file  Physical Activity: Not on file  Stress: Not on file  Social Connections: Not on file  Intimate Partner Violence: Not on file    Outpatient Medications Prior to Visit  Medication Sig Dispense Refill   acetaminophen (TYLENOL) 500 MG tablet Take 1-2 tablets (500-1,000 mg total) by mouth every 6 (six) hours as needed. 60 tablet 0   Blood Glucose Monitoring Suppl ([place]) w/Device KIT 1 each by Does not apply route daily. 1 kit 0   diclofenac Sodium (VOLTAREN) 1 % GEL SMARTSIG:2 Gram(s) Topical 1 to 4 Times Daily     diltiazem (CARDIZEM CD) 120 MG 24 hr capsule TAKE 1 CAPSULE BY MOUTH EVERY DAY 90 capsule 2   erythromycin ophthalmic ointment SMARTSIG:1 Sparingly In Eye(s) Twice Daily  ferrous sulfate 325 (65 FE) MG tablet Take 325 mg by mouth daily with breakfast.     finasteride (PROSCAR) 5 MG tablet TAKE 1 TABLET BY MOUTH EVERY DAY 90 tablet 2   furosemide (LASIX) 20 MG tablet Take 2 tablets (40 mg total) by mouth daily. 1 tablet 0   glucose blood (ONETOUCH VERIO) test strip Use as instructed 100 each 12   ketoconazole (NIZORAL) 2 % shampoo USE AS DIRECTED, ROTATE BETWEEN OVER THE COUNTER SHAMPOOS     Lancets (ONETOUCH ULTRASOFT) lancets Use as instructed 100 each 12   Mesalamine 800 MG TBEC TAKE 1 TABLET BY MOUTH TWICE A DAY 180 tablet 2   pantoprazole (PROTONIX) 20 MG tablet TAKE 1 TABLET BY MOUTH EVERY DAY AS DIRECTED 90 tablet 3   simvastatin (ZOCOR) 20 MG tablet TAKE 1 TABLET BY MOUTH EVERY DAY 90 tablet 2   tamsulosin (FLOMAX) 0.4 MG CAPS capsule TAKE 1 CAPSULE BY MOUTH EVERY DAY 90 capsule 2   tolnaftate (TINACTIN) 1  % powder Apply 1 application topically 2 (two) times daily. 45 g 3   TRADJENTA 5 MG TABS tablet TAKE 1 TABLET BY MOUTH EVERY DAY 90 tablet 3   traZODone (DESYREL) 100 MG tablet TAKE 1 TABLET BY MOUTH AT NIGHT 90 tablet 2   triamcinolone cream (KENALOG) 0.1 % APPLY TO AFFECTED AREA TWICE A DAY 454 g 3   No facility-administered medications prior to visit.    Allergies  Allergen Reactions   Clindamycin/Lincomycin Diarrhea    bloody   Doxycycline    Sulfa Antibiotics Other (See Comments)   Torsemide    Keflet [Cephalexin] Rash    ROS Review of Systems  Constitutional:  Negative for chills, fatigue and fever.  HENT:  Negative for congestion, ear pain and sore throat.   Respiratory:  Negative for cough and shortness of breath.   Cardiovascular:  Negative for chest pain.  Gastrointestinal:  Negative for abdominal pain, constipation, diarrhea, nausea and vomiting.  Endocrine: Negative for polydipsia, polyphagia and polyuria.  Genitourinary:  Negative for dysuria and frequency.  Musculoskeletal:  Negative for arthralgias and myalgias.  Neurological:  Negative for dizziness and headaches.  Psychiatric/Behavioral:  Negative for dysphoric mood.        No dysphoria     Objective:    Physical Exam Vitals reviewed.  HENT:     Right Ear: Tympanic membrane, ear canal and external ear normal.     Left Ear: Tympanic membrane, ear canal and external ear normal.  Cardiovascular:     Rate and Rhythm: Normal rate and regular rhythm.     Pulses: Normal pulses.     Heart sounds: Normal heart sounds. No murmur heard.   No gallop.  Pulmonary:     Effort: Pulmonary effort is normal. No respiratory distress.     Breath sounds: Normal breath sounds. No wheezing.  Musculoskeletal:     Right lower leg: No edema.     Left lower leg: No edema.     Left foot: Swelling present.       Legs:     Comments: Poor vascular circulation DISTal phalanx left 2nd toe with dry gangrene.  I discussed with Dr.  March Rummage  Skin:    Findings: Erythema present.  Neurological:     Mental Status: He is alert.    BP (!) 138/58   Pulse 71   Temp (!) 97.1 F (36.2 C)   Resp 16   Ht _0  (1.778 m)   Wt  198 lb (89.8 kg)   SpO2 99%   BMI 28.41 kg/m  Wt Readings from Last 3 Encounters:  12/28/20 198 lb (89.8 kg)  12/14/20 199 lb (90.3 kg)  12/06/20 198 lb (89.8 kg)     Health Maintenance Due  Topic Date Due   COVID-19 Vaccine (1) Never done   OPHTHALMOLOGY EXAM  Never done   Zoster Vaccines- Shingrix (1 of 2) Never done    There are no preventive care reminders to display for this patient.  Lab Results  Component Value Date   TSH 0.713 08/05/2014   Lab Results  Component Value Date   WBC 5.6 11/12/2020   HGB 10.6 (L) 11/12/2020   HCT 32.1 (L) 11/12/2020   MCV 87 11/12/2020   PLT 172 11/12/2020   Lab Results  Component Value Date   NA 141 11/12/2020   K 4.1 11/12/2020   CO2 23 11/12/2020   GLUCOSE 123 (H) 11/12/2020   BUN 37 (H) 11/12/2020   CREATININE 1.66 (H) 11/12/2020   BILITOT 0.8 11/12/2020   ALKPHOS 132 (H) 11/12/2020   AST 14 11/12/2020   ALT 12 11/12/2020   PROT 7.1 11/12/2020   ALBUMIN 4.3 11/12/2020   CALCIUM 10.0 11/12/2020   ANIONGAP 8 07/03/2016   EGFR 40 (L) 11/12/2020   Lab Results  Component Value Date   CHOL 159 11/12/2020   Lab Results  Component Value Date   HDL 69 11/12/2020   Lab Results  Component Value Date   LDLCALC 78 11/12/2020   Lab Results  Component Value Date   TRIG 62 11/12/2020   Lab Results  Component Value Date   CHOLHDL 2.3 11/12/2020   Lab Results  Component Value Date   HGBA1C 7.0 (H) 10/05/2020      Assessment & Plan:   Diagnoses and all orders for this visit: Varicose veins of both lower extremities with complications Unna boots have taken care of his edema and weeping legs  Gangrene of toe (HCC)  New gangrene left 2nd toe, redressed and sent to Dr. March Rummage.  I discussed gangrene with  daughter.      Follow-up: Return in about 2 weeks (around 01/11/2021).    Reinaldo Meeker, MD

## 2020-12-28 NOTE — Progress Notes (Signed)
  Subjective:  Patient ID: Glen Lee, male    DOB: 15-Jan-1934,  MRN: [id number]  Chief Complaint  Patient presents with   Toe Pain    Left 2nd toe wound x months.    DOS: 07/21/19 Procedure: Flexor tenotomy Left 2nd/3rd toes  87 y.o. male presents with the above complaint. History confirmed with patient. Sent over by Dr. Henrene Pastor today for new concern of ulcers to the toes with dark gangrenous appearance.  Objective:  Physical Exam: Wound Location: left 2nd toe Wound Measurement: 0.3x0.3 Wound Base: Granular/Healthy Peri-wound: Calloused Exudate: None: wound tissue dry wound without warmth, erythema, signs of acute infection  Rigid contracture bilateral 2nd, 3rd toes.   Right 2nd toe with pre-ulcerative lesion.  Assessment:   1. Hammer toes of both feet   2. Diabetes mellitus type 2 with peripheral artery disease ([place])   3. CKD (chronic kidney disease) stage 4, GFR 15-29 ml/min (HCC)   4. Persistent atrial fibrillation ([place])     Plan:  Patient was evaluated and treated and all questions answered.  Hammertoes bilat, with ulcer left 2nd toe -Pending surgical intervention for left 2nd/3rd hammertoes. -Recent vascular studies reviewed - s/o adequate perfusion. TBI 0.96 -Appears he has new ulceration to the distal tip of the left 2nd toe, pre-ulcer right. These are secondary to pressure. I want him to wear his surgical shoe in 2 weeks. We will f/u and see if ulceration is still active. May have to delay surgery while ulcer is present. Unfortunately he is at high risk for amputation if these ulcers worsen which is why arthroplasty to relieve contracture is indicated as a salvage procedure. -Advised to apply antibiotic cream and band-aid to the toes daily.  Return in about 2 weeks (around 01/11/2021) for Wound Care.

## 2020-12-28 NOTE — Telephone Encounter (Signed)
Called and advised not to use SSD that we planned to use given allergy. Pt reported allergy is leg swelling and weakness, confusion.

## 2020-12-29 ENCOUNTER — Telehealth: Payer: Self-pay

## 2020-12-29 NOTE — Telephone Encounter (Signed)
Pt called stating his boot is too big and needs to exchange for a smaller size

## 2020-12-30 NOTE — Telephone Encounter (Signed)
Pt.notified

## 2020-12-30 NOTE — Telephone Encounter (Signed)
Can you let him know he can come in today for an exchange in size

## 2021-01-10 ENCOUNTER — Encounter: Payer: Medicare Other | Admitting: Podiatry

## 2021-01-13 ENCOUNTER — Ambulatory Visit: Payer: Medicare Other | Admitting: Legal Medicine

## 2021-01-13 ENCOUNTER — Encounter: Payer: Self-pay | Admitting: Podiatry

## 2021-01-13 ENCOUNTER — Encounter: Payer: Self-pay | Admitting: Legal Medicine

## 2021-01-13 ENCOUNTER — Other Ambulatory Visit: Payer: Self-pay

## 2021-01-13 ENCOUNTER — Ambulatory Visit (INDEPENDENT_AMBULATORY_CARE_PROVIDER_SITE_OTHER): Payer: Medicare Other | Admitting: Podiatry

## 2021-01-13 VITALS — BP 120/78 | HR 65 | Temp 97.0°F | Ht 70.0 in | Wt 197.6 lb

## 2021-01-13 DIAGNOSIS — E1151 Type 2 diabetes mellitus with diabetic peripheral angiopathy without gangrene: Secondary | ICD-10-CM | POA: Diagnosis not present

## 2021-01-13 DIAGNOSIS — N184 Chronic kidney disease, stage 4 (severe): Secondary | ICD-10-CM | POA: Diagnosis not present

## 2021-01-13 DIAGNOSIS — I10 Essential (primary) hypertension: Secondary | ICD-10-CM | POA: Diagnosis not present

## 2021-01-13 DIAGNOSIS — I872 Venous insufficiency (chronic) (peripheral): Secondary | ICD-10-CM | POA: Diagnosis not present

## 2021-01-13 DIAGNOSIS — M2042 Other hammer toe(s) (acquired), left foot: Secondary | ICD-10-CM | POA: Diagnosis not present

## 2021-01-13 DIAGNOSIS — M2041 Other hammer toe(s) (acquired), right foot: Secondary | ICD-10-CM

## 2021-01-13 DIAGNOSIS — Z01818 Encounter for other preprocedural examination: Secondary | ICD-10-CM | POA: Diagnosis not present

## 2021-01-13 DIAGNOSIS — I4819 Other persistent atrial fibrillation: Secondary | ICD-10-CM

## 2021-01-13 DIAGNOSIS — E1121 Type 2 diabetes mellitus with diabetic nephropathy: Secondary | ICD-10-CM

## 2021-01-13 DIAGNOSIS — E782 Mixed hyperlipidemia: Secondary | ICD-10-CM

## 2021-01-13 NOTE — Progress Notes (Signed)
  Subjective:  Patient ID: Glen Lee, male    DOB: 01-31-1934,  MRN: [id number]  Chief Complaint  Patient presents with   Foot Ulcer    The 2nd and 3rd toes on the left are dry and there is not any draining and I have used neosporin   DOS: 07/21/19 Procedure: Flexor tenotomy Left 2nd/3rd toes  87 y.o. male presents with the above complaint. History confirmed with patient. Thinks the sore on the 2nd toe is doing much better. Is padding it off himself and wearing a sneaker rather than the surgical shoe - states this still puts too much pressure at the area.  Objective:  Physical Exam: Wound Location: left 2nd toe Wound Measurement: pinpoint. Wound Base: Granular/Healthy Peri-wound: Calloused Exudate: None: wound tissue dry wound without warmth, erythema, signs of acute infection  Rigid contracture bilateral 2nd, 3rd toes.   Right 2nd toe with pre-ulcerative lesion.  Assessment:   1. Hammer toes of both feet   2. Diabetes mellitus type 2 with peripheral artery disease ([place])   3. CKD (chronic kidney disease) stage 4, GFR 15-29 ml/min (HCC)      Plan:  Patient was evaluated and treated and all questions answered.  Hammertoes bilat, with ulcer left 2nd toe -Pending surgical intervention for left 2nd/3rd hammertoes. -Wound improved. Continue ointment and band-aid daily. Dispensed silicone crest pad to assist in offloading. -Advised should the wound be worsened at time of surgery will have to hold off intervention.  No follow-ups on file.

## 2021-01-13 NOTE — Progress Notes (Signed)
Established Patient Office Visit  Subjective:  Patient ID: Glen Lee, male    DOB: Dec 07, 1933  Age: 87 y.o. MRN: [id number]  CC:  Chief Complaint  Patient presents with   surgical clearance    Patient is having a Hammer toe on toe 2 and 3 on left foot.     HPI Glen Lee presents for patient present for clearance for hammer toe surgery.  No no CHF, strokes, no MI, He has moderate kidney disease, no COPD, no recent surgeries and no bleeding diathesis, he is on no anticoagulants.  Patient presents for follow up of hypertension.  Patient tolerating none well with side effects.  Patient was diagnosed with hypertension 2010 so has been treated for hypertension for 10 years.Patient is working on maintaining diet and exercise regimen and follows up as directed. Complication include PVD  Patient presents with hyperlipidemia.  Compliance with treatment has been good; patient takes medicines as directed, maintains low cholesterol diet, follows up as directed, and maintains exercise regimen.  Patient is using atorvastatin  Patient has chronic venous stasis and recurrent weaping in past.  He has hammer toes that need to be corrected   Patient has a diagnosis of chronic  atrial fibrillation.   Patient is on none and has controlled ventricular response.  Patient is CV stable.   Past Medical History:  Diagnosis Date   Anemia    Arthritis    Benign prostate hyperplasia    Diabetes mellitus without complication ([place])    Edema    right leg   Enlarged prostate    Esophageal reflux    Essential hypertension    Hyperlipidemia    Hypertension    Mixed hyperlipidemia    Tremor    right hand   Type 2 diabetes mellitus with other specified complication (HCC)    Vitamin D deficiency     Past Surgical History:  Procedure Laterality Date   JOINT REPLACEMENT Left    knee   KNEE SURGERY     ORIF ANKLE FRACTURE Right 06/30/2016   Procedure: OPEN REDUCTION INTERNAL FIXATION (ORIF) ANKLE  FRACTURE;  Surgeon: Altamese Vandalia, MD;  Location: [facility];  Service: Orthopedics;  Laterality: Right;    Family History  Problem Relation Age of Onset   Hyperlipidemia Mother    Diabetes Mother    Colon cancer Father    Diabetes Father     Social History   Socioeconomic History   Marital status: Single    Spouse name: Not on file   Number of children: 3   Years of education: Not on file   Highest education level: Not on file  Occupational History   Not on file  Tobacco Use   Smoking status: Former    Years: 20.00    Types: Cigarettes    Quit date: 08/04/1992    Years since quitting: 28.4   Smokeless tobacco: Never  Vaping Use   Vaping Use: Never used  Substance and Sexual Activity   Alcohol use: Not Currently    Alcohol/week: 0.0 standard drinks   Drug use: Never   Sexual activity: Not Currently  Other Topics Concern   Not on file  Social History Narrative   Concern for pt ability to care for himself. Pt refused placement post hospitalization after rehab   Social Determinants of Health   Financial Resource Strain: Not on file  Food Insecurity: Not on file  Transportation Needs: Not on file  Physical Activity: Not on file  Stress: Not on file  Social Connections: Not on file  Intimate Partner Violence: Not on file    Outpatient Medications Prior to Visit  Medication Sig Dispense Refill   acetaminophen (TYLENOL) 500 MG tablet Take 1-2 tablets (500-1,000 mg total) by mouth every 6 (six) hours as needed. 60 tablet 0   diclofenac Sodium (VOLTAREN) 1 % GEL SMARTSIG:2 Gram(s) Topical 1 to 4 Times Daily     diltiazem (CARDIZEM CD) 120 MG 24 hr capsule TAKE 1 CAPSULE BY MOUTH EVERY DAY 90 capsule 2   erythromycin ophthalmic ointment SMARTSIG:1 Sparingly In Eye(s) Twice Daily     ferrous sulfate 325 (65 FE) MG tablet Take 325 mg by mouth daily with breakfast.     finasteride (PROSCAR) 5 MG tablet TAKE 1 TABLET BY MOUTH EVERY DAY 90 tablet 2   furosemide (LASIX) 20 MG  tablet Take 2 tablets (40 mg total) by mouth daily. 1 tablet 0   ketoconazole (NIZORAL) 2 % shampoo USE AS DIRECTED, ROTATE BETWEEN OVER THE COUNTER SHAMPOOS     Mesalamine 800 MG TBEC TAKE 1 TABLET BY MOUTH TWICE A DAY 180 tablet 2   pantoprazole (PROTONIX) 20 MG tablet TAKE 1 TABLET BY MOUTH EVERY DAY AS DIRECTED 90 tablet 3   simvastatin (ZOCOR) 20 MG tablet TAKE 1 TABLET BY MOUTH EVERY DAY 90 tablet 2   tamsulosin (FLOMAX) 0.4 MG CAPS capsule TAKE 1 CAPSULE BY MOUTH EVERY DAY 90 capsule 2   tolnaftate (TINACTIN) 1 % powder Apply 1 application topically 2 (two) times daily. 45 g 3   TRADJENTA 5 MG TABS tablet TAKE 1 TABLET BY MOUTH EVERY DAY 90 tablet 3   traZODone (DESYREL) 100 MG tablet TAKE 1 TABLET BY MOUTH AT NIGHT 90 tablet 2   triamcinolone cream (KENALOG) 0.1 % APPLY TO AFFECTED AREA TWICE A DAY 454 g 3   glucose blood (ONETOUCH VERIO) test strip Use as instructed 100 each 12   Lancets (ONETOUCH ULTRASOFT) lancets Use as instructed 100 each 12   Blood Glucose Monitoring Suppl (ONETOUCH VERIO FLEX SYSTEM) w/Device KIT 1 each by Does not apply route daily. 1 kit 0   No facility-administered medications prior to visit.    Allergies  Allergen Reactions   Clindamycin/Lincomycin Diarrhea    bloody   Doxycycline    Sulfa Antibiotics Other (See Comments)   Torsemide    Keflet [Cephalexin] Rash    ROS Review of Systems  Constitutional:  Negative for chills, fatigue and fever.  HENT:  Negative for congestion, ear pain and sore throat.   Respiratory:  Negative for cough and shortness of breath.   Cardiovascular:  Negative for chest pain.  Gastrointestinal:  Negative for abdominal pain, constipation, diarrhea, nausea and vomiting.  Endocrine: Negative for polydipsia, polyphagia and polyuria.  Genitourinary:  Negative for dysuria and frequency.  Musculoskeletal:  Negative for arthralgias and myalgias.  Neurological:  Negative for dizziness and headaches.  Psychiatric/Behavioral:   Negative for dysphoric mood.        No dysphoria     Objective:    Physical Exam Vitals reviewed.  Constitutional:      General: He is in acute distress.     Appearance: Normal appearance. He is obese.  HENT:     Head: Normocephalic.     Right Ear: Tympanic membrane, ear canal and external ear normal.     Left Ear: Tympanic membrane, ear canal and external ear normal.     Nose: Nose normal.  Mouth/Throat:     Mouth: Mucous membranes are moist.     Pharynx: Oropharynx is clear.  Eyes:     Extraocular Movements: Extraocular movements intact.     Conjunctiva/sclera: Conjunctivae normal.     Pupils: Pupils are equal, round, and reactive to light.  Cardiovascular:     Rate and Rhythm: Normal rate and regular rhythm.     Pulses: Normal pulses.     Heart sounds: Normal heart sounds. No murmur heard.   No gallop.  Pulmonary:     Effort: Pulmonary effort is normal. No respiratory distress.     Breath sounds: Normal breath sounds. No wheezing.  Abdominal:     General: Abdomen is flat. Bowel sounds are normal. There is no distension.     Palpations: Abdomen is soft.     Tenderness: There is no abdominal tenderness.  Musculoskeletal:     Cervical back: Normal range of motion and neck supple.     Right lower leg: Edema present.     Left lower leg: No edema.     Comments: Hammer toes left 2nd and third  Skin:    Capillary Refill: Capillary refill takes less than 2 seconds.  Neurological:     General: No focal deficit present.     Mental Status: He is alert. Mental status is at baseline.     Motor: Weakness present.     Gait: Gait abnormal.  Psychiatric:        Mood and Affect: Mood normal.        Thought Content: Thought content normal.    BP 120/78   Pulse 65   Temp (!) 97 F (36.1 C)   Ht _0  (1.778 m)   Wt 197 lb 9.6 oz (89.6 kg)   SpO2 95%   BMI 28.35 kg/m  Wt Readings from Last 3 Encounters:  01/13/21 197 lb 9.6 oz (89.6 kg)  12/28/20 198 lb (89.8 kg)   12/14/20 199 lb (90.3 kg)     Health Maintenance Due  Topic Date Due   COVID-19 Vaccine (1) Never done   OPHTHALMOLOGY EXAM  Never done   Zoster Vaccines- Shingrix (1 of 2) Never done   INFLUENZA VACCINE  01/10/2021    There are no preventive care reminders to display for this patient.  Lab Results  Component Value Date   TSH 0.713 08/05/2014   Lab Results  Component Value Date   WBC 5.6 11/12/2020   HGB 10.6 (L) 11/12/2020   HCT 32.1 (L) 11/12/2020   MCV 87 11/12/2020   PLT 172 11/12/2020   Lab Results  Component Value Date   NA 141 11/12/2020   K 4.1 11/12/2020   CO2 23 11/12/2020   GLUCOSE 123 (H) 11/12/2020   BUN 37 (H) 11/12/2020   CREATININE 1.66 (H) 11/12/2020   BILITOT 0.8 11/12/2020   ALKPHOS 132 (H) 11/12/2020   AST 14 11/12/2020   ALT 12 11/12/2020   PROT 7.1 11/12/2020   ALBUMIN 4.3 11/12/2020   CALCIUM 10.0 11/12/2020   ANIONGAP 8 07/03/2016   EGFR 40 (L) 11/12/2020   Lab Results  Component Value Date   CHOL 159 11/12/2020   Lab Results  Component Value Date   HDL 69 11/12/2020   Lab Results  Component Value Date   LDLCALC 78 11/12/2020   Lab Results  Component Value Date   TRIG 62 11/12/2020   Lab Results  Component Value Date   CHOLHDL 2.3 11/12/2020   Lab Results  Component Value Date   HGBA1C 7.0 (H) 10/05/2020      Assessment & Plan:   Diagnoses and all orders for this visit: Preoperative general physical examination  Patient  is stable for hammer toe surgery  Persistent atrial fibrillation [facility]) Patient has a diagnosis of chronic atrial fibrillation.   Patient is on none and has controlled ventricular response.  Patient is CV stable .  Chronic venous insufficiency Patient has chronic venous insufficiency in legs with frequent breakdown  Essential hypertension An individual hypertension care plan was established and reinforced today.  The patient's status was assessed using clinical findings on exam and labs or  diagnostic tests. The patient's success at meeting treatment goals on disease specific evidence-based guidelines and found to be well controlled. SELF MANAGEMENT: The patient and I together assessed ways to personally work towards obtaining the recommended goals. RECOMMENDATIONS: avoid decongestants found in common cold remedies, decrease consumption of alcohol, perform routine monitoring of BP with home BP cuff, exercise, reduction of dietary salt, take medicines as prescribed, try not to miss doses and quit smoking.  Regular exercise and maintaining a healthy weight is needed.  Stress reduction may help. A CLINICAL SUMMARY including written plan identify barriers to care unique to individual due to social or financial issues.  We attempt to mutually creat solutions for individual and family understanding.   Diabetes mellitus type 2 with peripheral artery disease ([place]) An individual care plan for diabetes was established and reinforced today.  The patient's status was assessed using clinical findings on exam, labs and diagnostic testing. Patient success at meeting goals based on disease specific evidence-based guidelines and found to be fair controlled. Medications were assessed and patient's understanding of the medical issues , including barriers were assessed. Recommend adherence to a diabetic diet, a graduated exercise program, HgbA1c level is checked quarterly, and urine microalbumin performed yearly .  Annual mono-filament sensation testing performed. Lower blood pressure and control hyperlipidemia is important. Get annual eye exams and annual flu shots and smoking cessation discussed.  Self management goals were discussed.   Diabetic glomerulopathy [employer]) An individual care plan for diabetes was established and reinforced today.  The patient's status was assessed using clinical findings on exam, labs and diagnostic testing. Patient success at meeting goals based on disease specific evidence-based  guidelines and found to be fair controlled. Medications were assessed and patient's understanding of the medical issues , including barriers were assessed. Recommend adherence to a diabetic diet, a graduated exercise program, HgbA1c level is checked quarterly, and urine microalbumin performed yearly .  Annual mono-filament sensation testing performed. Lower blood pressure and control hyperlipidemia is important. Get annual eye exams and annual flu shots and smoking cessation discussed.  Self management goals were discussed.   CKD (chronic kidney disease) stage 4, GFR 15-29 ml/min (HCC) AN INDIVIDUAL CARE PLAN was established and reinforced today.  The patient's status was assessed using clinical findings on exam, labs, and other diagnostic testing. Patient's success at meeting treatment goals based on disease specific evidence-bassed guidelines and found to be in good control. RECOMMENDATIONS include maintaining present medicines and treatment.   Mixed hyperlipidemia AN INDIVIDUAL CARE PLAN for hyperlipidemia/ cholesterol was established and reinforced today.  The patient's status was assessed using clinical findings on exam, lab and other diagnostic tests. The patient's disease status was assessed based on evidence-based guidelines and found to be well controlled. MEDICATIONS were reviewed. SELF MANAGEMENT GOALS have been discussed and patient's success at attaining the goal of low  cholesterol was assessed. RECOMMENDATION given include regular exercise 3 days a week and low cholesterol/low fat diet. CLINICAL SUMMARY including written plan to identify barriers unique to the patient due to social or economic  reasons was discussed.      Follow-up: Return if symptoms worsen or fail to improve.    Reinaldo Meeker, MD

## 2021-01-20 ENCOUNTER — Telehealth: Payer: Self-pay | Admitting: Urology

## 2021-01-20 NOTE — Telephone Encounter (Signed)
DOS - 02/02/21  HAMMERTOE REPAIR 2,3 LEFT --- [id number]   [employer] EFFECTIVE DATE - 06/12/20   PLAN DEDUCTIBLE - $500.00 W/ $0.00 REMAINING  OUT OF POCKET - $3,500.00 W/ $2,723.82 REMAINING COINSURANCE - 0% COPAY - $250    PER [employer] WEB SITE FOR CPT CODE 21194 X'S 2 HAS BEEN APPROVED, AUTH # [id number].

## 2021-01-24 ENCOUNTER — Encounter: Payer: Medicare Other | Admitting: Podiatry

## 2021-01-25 ENCOUNTER — Telehealth: Payer: Medicare Other

## 2021-01-25 ENCOUNTER — Ambulatory Visit (INDEPENDENT_AMBULATORY_CARE_PROVIDER_SITE_OTHER): Payer: Medicare Other

## 2021-01-25 DIAGNOSIS — L03116 Cellulitis of left lower limb: Secondary | ICD-10-CM

## 2021-01-25 DIAGNOSIS — E1151 Type 2 diabetes mellitus with diabetic peripheral angiopathy without gangrene: Secondary | ICD-10-CM

## 2021-01-25 DIAGNOSIS — L03115 Cellulitis of right lower limb: Secondary | ICD-10-CM

## 2021-01-25 DIAGNOSIS — M2041 Other hammer toe(s) (acquired), right foot: Secondary | ICD-10-CM

## 2021-01-25 DIAGNOSIS — R600 Localized edema: Secondary | ICD-10-CM

## 2021-01-25 DIAGNOSIS — I96 Gangrene, not elsewhere classified: Secondary | ICD-10-CM

## 2021-01-25 DIAGNOSIS — M2042 Other hammer toe(s) (acquired), left foot: Secondary | ICD-10-CM

## 2021-01-25 NOTE — Chronic Care Management (AMB) (Signed)
Chronic Care Management   CCM RN Visit Note  01/25/2021 Name: Glen Lee MRN: [id number] DOB: [birth year]  Subjective: Glen Lee is a 87 y.o. year old male who is a primary care patient of Glen Anes, MD. The care management team was consulted for assistance with disease management and care coordination needs.    Engaged with patient by telephone for follow up visit in response to provider referral for case management and/or care coordination services.   Consent to Services:  The patient was given information about Chronic Care Management services, agreed to services, and gave verbal consent prior to initiation of services.  Please see initial visit note for detailed documentation.   Patient agreed to services and verbal consent obtained.   Assessment: Review of patient past medical history, allergies, medications, health status, including review of consultants reports, laboratory and other test data, was performed as part of comprehensive evaluation and provision of chronic care management services.   SDOH (Social Determinants of Health) assessments and interventions performed:    CCM Care Plan  Allergies  Allergen Reactions   Clindamycin/Lincomycin Diarrhea    bloody   Doxycycline    Sulfa Antibiotics Other (See Comments)   Torsemide    Keflet [Cephalexin] Rash    Outpatient Encounter Medications as of 01/25/2021  Medication Sig   acetaminophen (TYLENOL) 500 MG tablet Take 1-2 tablets (500-1,000 mg total) by mouth every 6 (six) hours as needed.   diclofenac Sodium (VOLTAREN) 1 % GEL SMARTSIG:2 Gram(s) Topical 1 to 4 Times Daily   diltiazem (CARDIZEM CD) 120 MG 24 hr capsule TAKE 1 CAPSULE BY MOUTH EVERY DAY   erythromycin ophthalmic ointment SMARTSIG:1 Sparingly In Eye(s) Twice Daily   ferrous sulfate 325 (65 FE) MG tablet Take 325 mg by mouth daily with breakfast.   finasteride (PROSCAR) 5 MG tablet TAKE 1 TABLET BY MOUTH EVERY DAY   furosemide (LASIX)  20 MG tablet Take 2 tablets (40 mg total) by mouth daily.   ketoconazole (NIZORAL) 2 % shampoo USE AS DIRECTED, ROTATE BETWEEN OVER THE COUNTER SHAMPOOS   Mesalamine 800 MG TBEC TAKE 1 TABLET BY MOUTH TWICE A DAY   pantoprazole (PROTONIX) 20 MG tablet TAKE 1 TABLET BY MOUTH EVERY DAY AS DIRECTED   simvastatin (ZOCOR) 20 MG tablet TAKE 1 TABLET BY MOUTH EVERY DAY   tamsulosin (FLOMAX) 0.4 MG CAPS capsule TAKE 1 CAPSULE BY MOUTH EVERY DAY   tolnaftate (TINACTIN) 1 % powder Apply 1 application topically 2 (two) times daily.   TRADJENTA 5 MG TABS tablet TAKE 1 TABLET BY MOUTH EVERY DAY   traZODone (DESYREL) 100 MG tablet TAKE 1 TABLET BY MOUTH AT NIGHT   triamcinolone cream (KENALOG) 0.1 % APPLY TO AFFECTED AREA TWICE A DAY   No facility-administered encounter medications on file as of 01/25/2021.    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   Mixed hyperlipidemia    Essential hypertension    Diabetes mellitus type 2 with peripheral artery disease (  [place])    Benign prostate hyperplasia    Hammer toes of both feet 03/21/2017   Right ankle joint deformity 03/21/2017   Ankle fracture 06/29/2016   Varicose veins of lower extremities with complications 16/55/3748   Chronic venous insufficiency 05/14/2015   PAF (paroxysmal atrial fibrillation) ([place]) 09/16/2014   Persistent atrial fibrillation (HCC)    Hypotension 08/04/2014   CKD (chronic kidney disease) stage 4, GFR 15-29 ml/min ([place]) 08/04/2014     Conditions to be addressed/monitored:DMII and CKD Stage edema  Care Plan : Cellulitis- ineffective management of medication to help reduce fluid overload in lower extremities  Updates made by Thana Ates, RN since 01/25/2021 12:00 AM     Problem: Medication Adherence ([facility])      Long-Range Goal: Medication Adherence Maintained   Start Date: 10/19/2020  Expected End Date: 01/18/2021  Recent Progress: On track  Priority: High  Note:    Current Barriers:  Ineffective Self Health Maintenance- Patient reports he is taking all medications as prescribed. Including the lasix.  Unable to self administer medications as prescribed: currently reports taking medications as prescribed. 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 Pending surgical intervention.   Clinical Goal(s):  Collaboration with Glen Anes, MD regarding development and update of comprehensive plan of care as evidenced by provider attestation and co-signature Inter-disciplinary care team collaboration (see longitudinal plan of care) patient will work with care management team to address care coordination and chronic disease management needs related to Medication Management and Education   Interventions:  Evaluation of current treatment plan related to DMII, fluid overload and self-management and patient's adherence to plan as established by provider. Collaboration with Glen Anes, MD regarding development and update of comprehensive plan of care as evidenced by provider attestation       and co-signature Inter-disciplinary care team collaboration (see longitudinal plan of care) Discussed plans with patient for ongoing care management follow up and provided patient with direct contact information for care management team Discussed and reviewed with patient the reason for taking Lasix and the importance of not skipping or self reducing medications.  Reviewed progress of contnued swelling at this time and that his toes are healing. Again reviewed the signs and symptoms of infection and encouraged patient to call MD office at the onset of increased swelling, pain, redness or drainage.  Encouraged patient to wear shoes as per MD. Reviewed importance of not bumping toes.  Self Care Activities:  Patient verbalizes understanding of plan to take Lasix as prescribed Self administers medications as prescribed using the aid of a pill box.  Attends all scheduled provider appointments Calls provider office for new concerns or questions Patient Goals: - Take my medications as prescribed.  -I will call my doctor for increase swelling or redness in my legs. - elevate legs as much as I can - wear protective footwear.  Follow Up Plan: Telephone follow up appointment with care management team member scheduled for: 03/22/2021    Care Plan : Diabetes Type 2 - Patient will self monitor weekly as per MD recommendation  Updates made by Thana Ates, RN since 01/25/2021 12:00 AM     Problem: Disease Progression (Diabetes, Type 2)   Priority: Medium  Onset Date: 10/19/2020  Note:   Objective:  Lab Results  Component Value Date   HGBA1C 7.0 (H) 10/05/2020   Lab Results  Component Value Date   CREATININE 1.47 (H) 10/05/2020  CREATININE 1.46 (H) 05/17/2020   CREATININE 1.34 (H) 02/09/2020   Lab Results  Component Value Date   EGFR 47 (L) 10/05/2020   Current Barriers:  Does not have glucometer to monitor blood sugar-  patient reports no CBG meter or CBG supplies due to cost a long time ago.  Does not adhere to provider recommendations re:  MD recommends patient self monitor weekly and record  readings.  Does not adhere to prescribed medication regimen: Patient not taking Lasix as prescribed Case Manager Clinical Goal(s):  patient will demonstrate improved adherence to prescribed treatment plan for diabetes self care/management as evidenced  by: adherence to prescribed medication regimen, contacting provider for new or worsened symptoms or questions. Patient will self monitor weekly and record on calendar.  Interventions:  Collaboration with Glen Anes, MD regarding development and update of comprehensive plan of care as evidenced by provider attestation and co-signature Inter-disciplinary care team collaboration (see longitudinal plan of care) Reviewed medications with patient and discussed importance of medication adherence Discussed plans with patient for ongoing care management follow up and provided patient with direct contact information for care management team Reviewed scheduled/upcoming provider appointments including: this case manager follow up on 11/23/2020 Advised patient, providing education and rationale, to check cbg weekly and record, calling MD office for findings outside established parameters.   Review of patient status, including review of consultants reports, relevant laboratory and other test results, and medications completed. Reviewed Hgb A1c results and goals.  Spoke with MD about recommendations for monitoring and requested CBG and supply orders be sent to the pharmacy. Self-Care Activities - Self administers oral medications as prescribed Attends all scheduled provider appointments Patient Goals: - check blood sugar at prescribed times - take the blood sugar log to all doctor visits - take the blood sugar meter to all doctor visits  Follow Up Plan: Telephone follow up appointment with care management team member scheduled for: 11/23/2020    Long-Range Goal: Monitor and Manage My Blood Sugar-Diabetes Type 2   Start Date: 10/19/2020  This Visit's Progress: Not on track  Recent Progress: On track  Priority: Medium  Note:   Timeframe:  Long-Range Goal Priority:  Medium Start Date:      10/19/2020                       Expected End Date:       01/18/2021                Follow Up Date  03/22/2021    Objective: refuses to self monitor. Does not want to pay for balance of meter and supplies after insurance payment. Lab Results  Component Value Date   HGBA1C 7.0 (H) 10/05/2020   Lab Results  Component Value Date   CREATININE 1.47 (H) 10/05/2020   CREATININE 1.46 (H) 05/17/2020   CREATININE 1.34 (H) 02/09/2020   Lab Results  Component Value Date   EGFR 47 (L) 10/05/2020  Current Barriers:  Does not have glucometer to monitor blood sugar- patient reports he does not want a meter and does not want to self monitor. Does not adhere to provider recommendations:  refuses meter Does not adhere to prescribed medication regimen- currently is taking medications as prescribed.  Case Manager Clinical Goal(s):  patient will demonstrate improved adherence to prescribed treatment plan for diabetes self care/management as evidenced by: adherence to prescribed medication regimen contacting provider for new or worsened symptoms or questions.  Encouraged patient to report  any new signs of infection to MD immediately. Interventions:  Collaboration with Glen Anes, MD regarding development and update of comprehensive plan of care as evidenced by provider attestation and co-signature Inter-disciplinary care team collaboration (see longitudinal plan of care) Reviewed medications with patient and discussed importance of medication adherence Requested MD send RX to local pharmacy. Reviewed signs and symptoms of high and low blood sugar.  Encouraged patient to eat regular meals and stay hydrated.  Reviewed with patient the interest in self monitoring and he declined. Self-Care Activities - Checks blood sugars as prescribed and utilize hyper and hypoglycemia protocol as needed Patient Goals: - I will call the doctor for any changes in my condition. - I will tell my niece if I don't feel good.  Follow Up Plan: Telephone follow up appointment with care management team member  scheduled for: 01/25/2021         Plan:Telephone follow up appointment with care management team member scheduled for:  03/22/2021  Tomasa Rand, RN, BSN, CEN RN Case Manager [facility] [phone]

## 2021-01-25 NOTE — Patient Instructions (Signed)
Visit Information  PATIENT GOALS:  Goals Addressed             This Visit's Progress    Manage My Medicine       Timeframe:  Long-Range Goal Priority:  High Start Date:        10/19/2020                    Expected End Date:    01/19/2021                   Follow Up Date 03/22/2021  - Take my medications as prescribed.  -I will call my doctor for increase swelling or redness in my legs. - elevate legs as much as I can - wear protective footwear.           Monitor and Manage My Blood Sugar-Diabetes Type 2       Timeframe:  Long-Range Goal Priority:  Medium Start Date:        10/19/2020                     Expected End Date: 01/19/2021              Follow Up Date: 03/22/2021  - I will call the doctor for any changes in my condition. - I will tell my niece if I don't feel good.          The patient verbalized understanding of instructions, educational materials, and care plan provided today and declined offer to receive copy of patient instructions, educational materials, and care plan.   Telephone follow up appointment with care management team member scheduled for:  03/22/2021  Tomasa Rand, RN, BSN, CEN RN Case Manager [facility] [phone]

## 2021-01-31 ENCOUNTER — Encounter (HOSPITAL_BASED_OUTPATIENT_CLINIC_OR_DEPARTMENT_OTHER): Payer: Self-pay | Admitting: Podiatry

## 2021-01-31 ENCOUNTER — Other Ambulatory Visit: Payer: Self-pay

## 2021-01-31 NOTE — Progress Notes (Signed)
Spoke w/ via phone for pre-op interview--- Patient and niece Laurene  Lab needs dos---- [employer] results------ COVID test -----patient states asymptomatic no test needed Arrive at ------- 0530 NPO after MN NO Solid Food.  Clear liquids from MN until--- Med rec completed Medications to take morning of surgery ----- Protonix, Cardizem, Flomax, Zocor Diabetic medication -----pt takes Trajendta instructed not to take am of surgery. Pt and niece verbalized understanding. Patient instructed no nail polish to be worn day of surgery Patient instructed to bring photo id and insurance card day of surgery Patient aware to have Driver (ride ) / caregiver    for 24 hours after surgery  Patient Special Instructions ----- Pre-Op special Istructions ----- Patient verbalized understanding of instructions that were given at this phone interview. Patient denies shortness of breath, chest pain, fever, cough at this phone interview. Anesthesia Review:  PCP: Willette Brace Cardiologist : Chest x-ray : EKG : Echo :04/2019 Stress test: Cardiac Cath :  Activity level:  Sleep Study/ CPAP : Fasting Blood Sugar :      / Checks Blood Sugar -- times a day:  Patient type II diabetic, states he does not check his CBGs. Blood Thinner/ Instructions Maryjane Hurter Dose: ASA / Instructions/ Last Dose :

## 2021-02-01 ENCOUNTER — Ambulatory Visit (HOSPITAL_BASED_OUTPATIENT_CLINIC_OR_DEPARTMENT_OTHER): Payer: Medicare Other | Admitting: Anesthesiology

## 2021-02-01 NOTE — Anesthesia Preprocedure Evaluation (Addendum)
Anesthesia Evaluation  Patient identified by MRN, date of birth, ID band Patient awake    Reviewed: Allergy & Precautions, NPO status , Patient's Chart, lab work & pertinent test results  Airway Mallampati: II  TM Distance: >3 FB     Dental   Pulmonary former smoker,    breath sounds clear to auscultation       Cardiovascular hypertension, + Peripheral Vascular Disease   Rhythm:Regular Rate:Normal     Neuro/Psych    GI/Hepatic Neg liver ROS, GERD  ,  Endo/Other  diabetes  Renal/GU Renal disease     Musculoskeletal   Abdominal   Peds  Hematology   Anesthesia Other Findings   Reproductive/Obstetrics                            Anesthesia Physical Anesthesia Plan  ASA: 3  Anesthesia Plan: General   Post-op Pain Management:    Induction: Intravenous  PONV Risk Score and Plan: 2 and Ondansetron, Dexamethasone and Midazolam  Airway Management Planned: LMA  Additional Equipment:   Intra-op Plan:   Post-operative Plan: Extubation in OR  Informed Consent: I have reviewed the patients History and Physical, chart, labs and discussed the procedure including the risks, benefits and alternatives for the proposed anesthesia with the patient or authorized representative who has indicated his/her understanding and acceptance.     Dental advisory given  Plan Discussed with: Anesthesiologist and CRNA  Anesthesia Plan Comments:        Anesthesia Quick Evaluation

## 2021-02-02 ENCOUNTER — Other Ambulatory Visit: Payer: Self-pay

## 2021-02-02 ENCOUNTER — Ambulatory Visit (HOSPITAL_BASED_OUTPATIENT_CLINIC_OR_DEPARTMENT_OTHER)
Admission: RE | Admit: 2021-02-02 | Discharge: 2021-02-02 | Disposition: A | Discharge status: Home / Self Care | Payer: Medicare Other | Attending: Podiatry | Admitting: Podiatry

## 2021-02-02 ENCOUNTER — Encounter (HOSPITAL_BASED_OUTPATIENT_CLINIC_OR_DEPARTMENT_OTHER): Payer: Self-pay | Admitting: Podiatry

## 2021-02-02 ENCOUNTER — Encounter (HOSPITAL_BASED_OUTPATIENT_CLINIC_OR_DEPARTMENT_OTHER)
Admission: RE | Disposition: A | Discharge status: Home / Self Care | Payer: Self-pay | Source: Home / Self Care | Attending: Podiatry

## 2021-02-02 DIAGNOSIS — Z538 Procedure and treatment not carried out for other reasons: Secondary | ICD-10-CM | POA: Insufficient documentation

## 2021-02-02 DIAGNOSIS — M2042 Other hammer toe(s) (acquired), left foot: Secondary | ICD-10-CM | POA: Insufficient documentation

## 2021-02-02 HISTORY — DX: Unspecified hearing loss, unspecified ear: H91.90

## 2021-02-02 LAB — POCT I-STAT, CHEM 8
BUN: 31 mg/dL — ABNORMAL HIGH (ref 8–23)
Calcium, Ion: 1.26 mmol/L (ref 1.15–1.40)
Chloride: 105 mmol/L (ref 98–111)
Creatinine, Ser: 1.6 mg/dL — ABNORMAL HIGH (ref 0.61–1.24)
Glucose, Bld: 140 mg/dL — ABNORMAL HIGH (ref 70–99)
HCT: 31 % — ABNORMAL LOW (ref 39.0–52.0)
Hemoglobin: 10.5 g/dL — ABNORMAL LOW (ref 13.0–17.0)
Potassium: 3.6 mmol/L (ref 3.5–5.1)
Sodium: 143 mmol/L (ref 135–145)
TCO2: 27 mmol/L (ref 22–32)

## 2021-02-02 SURGERY — CANCELLED PROCEDURE
Anesthesia: General | Laterality: Left

## 2021-02-02 MED ORDER — FENTANYL CITRATE (PF) 100 MCG/2ML IJ SOLN
INTRAMUSCULAR | Status: AC
  Filled 2021-02-02: qty 2

## 2021-02-02 MED ORDER — LACTATED RINGERS IV SOLN: INTRAVENOUS | Status: DC

## 2021-02-02 MED ORDER — LEVOFLOXACIN 250 MG PO TABS
250.0000 mg | ORAL_TABLET | Freq: Every day | ORAL | 0 refills | Status: DC
Start: 2021-02-02 — End: 2021-02-11

## 2021-02-02 MED ORDER — PROPOFOL 10 MG/ML IV BOLUS
INTRAVENOUS | Status: AC
  Filled 2021-02-02: qty 20

## 2021-02-02 MED ORDER — LIDOCAINE HCL (PF) 2 % IJ SOLN
INTRAMUSCULAR | Status: AC
  Filled 2021-02-02: qty 5

## 2021-02-02 SURGICAL SUPPLY — 53 items
BANDAGE ESMARK 6X9 LF (GAUZE/BANDAGES/DRESSINGS) IMPLANT
BLADE AVERAGE 25MMX9MM (BLADE)
BLADE AVERAGE 25X9 (BLADE) IMPLANT
BLADE MINI RND TIP GREEN BEAV (BLADE) IMPLANT
BLADE OSC/SAG .038X5.5 CUT EDG (BLADE) IMPLANT
BLADE SURG 15 STRL LF DISP TIS (BLADE) IMPLANT
BLADE SURG 15 STRL SS (BLADE)
BNDG ELASTIC 4X5.8 VLCR STR LF (GAUZE/BANDAGES/DRESSINGS) IMPLANT
BNDG ESMARK 6X9 LF (GAUZE/BANDAGES/DRESSINGS)
BNDG GAUZE ELAST 4 BULKY (GAUZE/BANDAGES/DRESSINGS) IMPLANT
CHLORAPREP W/TINT 26 (MISCELLANEOUS) IMPLANT
COVER BACK TABLE 60X90IN (DRAPES) IMPLANT
CUFF TOURN SGL QUICK 18X4 (TOURNIQUET CUFF) IMPLANT
CUFF TOURN SGL QUICK 24 (TOURNIQUET CUFF)
CUFF TOURN SGL QUICK 34 (TOURNIQUET CUFF)
CUFF TRNQT CYL 24X4X16.5-23 (TOURNIQUET CUFF) IMPLANT
CUFF TRNQT CYL 34X4.125X (TOURNIQUET CUFF) IMPLANT
DECANTER SPIKE VIAL GLASS SM (MISCELLANEOUS) IMPLANT
DRAPE 3/4 80X56 (DRAPES) IMPLANT
DRAPE C-ARM 35X43 STRL (DRAPES) IMPLANT
DRAPE EXTREMITY T 121X128X90 (DISPOSABLE) IMPLANT
DRAPE U-SHAPE 47X51 STRL (DRAPES) IMPLANT
ELECT REM PT RETURN 9FT ADLT (ELECTROSURGICAL)
ELECTRODE REM PT RTRN 9FT ADLT (ELECTROSURGICAL) IMPLANT
GAUZE 4X4 16PLY ~~LOC~~+RFID DBL (SPONGE) IMPLANT
GAUZE SPONGE 4X4 12PLY STRL (GAUZE/BANDAGES/DRESSINGS) IMPLANT
GAUZE XEROFORM 1X8 LF (GAUZE/BANDAGES/DRESSINGS) IMPLANT
GLOVE SRG 8 PF TXTR STRL LF DI (GLOVE) IMPLANT
GLOVE SURG ENC MOIS LTX SZ7.5 (GLOVE) IMPLANT
GLOVE SURG UNDER POLY LF SZ8 (GLOVE)
GOWN STRL REUS W/TWL XL LVL3 (GOWN DISPOSABLE) IMPLANT
K-WIRE SURGICAL 1.6X102 (WIRE) IMPLANT
KIT TURNOVER CYSTO (KITS) IMPLANT
NEEDLE HYPO 25X1 1.5 SAFETY (NEEDLE) IMPLANT
NS IRRIG 1000ML POUR BTL (IV SOLUTION) ×4 IMPLANT
PACK BASIN DAY SURGERY FS (CUSTOM PROCEDURE TRAY) IMPLANT
PENCIL SMOKE EVACUATOR (MISCELLANEOUS) IMPLANT
PIN CAPS ORTHO GREEN .062 (PIN) IMPLANT
STAPLER VISISTAT 35W (STAPLE) IMPLANT
SUCTION FRAZIER HANDLE 10FR (MISCELLANEOUS)
SUCTION TUBE FRAZIER 10FR DISP (MISCELLANEOUS) IMPLANT
SUT ETHILON 4 0 PS 2 18 (SUTURE) IMPLANT
SUT MNCRL AB 3-0 PS2 18 (SUTURE) IMPLANT
SUT MNCRL AB 4-0 PS2 18 (SUTURE) IMPLANT
SUT VIC AB 2-0 SH 27 (SUTURE)
SUT VIC AB 2-0 SH 27XBRD (SUTURE) IMPLANT
SYR BULB EAR ULCER 3OZ GRN STR (SYRINGE) IMPLANT
SYR CONTROL 10ML LL (SYRINGE) IMPLANT
TRAY DSU PREP LF (CUSTOM PROCEDURE TRAY) IMPLANT
TUBE CONNECTING 12'X1/4 (SUCTIONS)
TUBE CONNECTING 12X1/4 (SUCTIONS) IMPLANT
UNDERPAD 30X36 HEAVY ABSORB (UNDERPADS AND DIAPERS) IMPLANT
YANKAUER SUCT BULB TIP NO VENT (SUCTIONS) IMPLANT

## 2021-02-02 NOTE — Progress Notes (Signed)
Evaluated in pre-op. Has bacterial infection of the 2nd/3rd toes. No open wounds noted. Given infection will have to hold off intervention today. Will rx abx and see patient as scheduled for re-eval and scheduling.

## 2021-02-03 ENCOUNTER — Telehealth: Payer: Self-pay | Admitting: Podiatry

## 2021-02-03 NOTE — Telephone Encounter (Signed)
Called and spoke to patient's POA about his myriad allergies as well as possible interactions with his other medications making this abx one of the only other options. I discussed he can not take it if we get rid of the infection topically. Advised betadine to the area on top of and between toes to keep the skin dry and clean.

## 2021-02-03 NOTE — Telephone Encounter (Signed)
Pt was given rx for infection (they didn't know name of it) Pt is refusing to take per POA because it says no one over age 85 should take because it will affect tendons and such.  They are requesting a new medication if possible.  [facility]

## 2021-02-07 ENCOUNTER — Encounter: Payer: Medicare Other | Admitting: Podiatry

## 2021-02-10 ENCOUNTER — Ambulatory Visit: Payer: Medicare Other | Admitting: Podiatry

## 2021-02-11 ENCOUNTER — Encounter: Payer: Self-pay | Admitting: Legal Medicine

## 2021-02-11 ENCOUNTER — Ambulatory Visit: Payer: Medicare Other | Admitting: Legal Medicine

## 2021-02-11 ENCOUNTER — Other Ambulatory Visit: Payer: Self-pay

## 2021-02-11 VITALS — BP 140/70 | HR 61 | Temp 97.9°F | Resp 15 | Ht 70.0 in | Wt 202.0 lb

## 2021-02-11 DIAGNOSIS — L03116 Cellulitis of left lower limb: Secondary | ICD-10-CM

## 2021-02-11 MED ORDER — CIPROFLOXACIN HCL 500 MG PO TABS
500.0000 mg | ORAL_TABLET | Freq: Two times a day (BID) | ORAL | 0 refills | Status: AC
Start: 2021-02-11 — End: 2021-02-21

## 2021-02-11 NOTE — Progress Notes (Signed)
Acute Office Visit  Subjective:    Patient ID: Glen Lee, male    DOB: 25-Jan-1934, 87 y.o.   MRN: [id number]  Chief Complaint  Patient presents with   Toes infection    Last Thursday they canceled the surgery on his Left foot because his toes were infected. They gave him antibiotic Levofloxacin however he did not take it.     HPI Patient is in today for cellulitis left toes  he was suppose to have toe surgery but the toes got infected and the surgery was cancelled.  It is now red and weepiness.  Past Medical History:  Diagnosis Date   Anemia    Arthritis    Benign prostate hyperplasia    Diabetes mellitus without complication (HCC)    Edema    right leg   Enlarged prostate    Esophageal reflux    Essential hypertension    HOH (hard of hearing)    wears hearing aid   Hyperlipidemia    Hypertension    Mixed hyperlipidemia    Tremor    right hand   Type 2 diabetes mellitus with other specified complication (HCC)    Vitamin D deficiency     Past Surgical History:  Procedure Laterality Date   JOINT REPLACEMENT Left    knee   KNEE SURGERY     ORIF ANKLE FRACTURE Right 06/30/2016   Procedure: OPEN REDUCTION INTERNAL FIXATION (ORIF) ANKLE FRACTURE;  Surgeon: Altamese Ewa Gentry, MD;  Location: [facility];  Service: Orthopedics;  Laterality: Right;    Family History  Problem Relation Age of Onset   Hyperlipidemia Mother    Diabetes Mother    Colon cancer Father    Diabetes Father     Social History   Socioeconomic History   Marital status: Single    Spouse name: Not on file   Number of children: 3   Years of education: Not on file   Highest education level: Not on file  Occupational History   Not on file  Tobacco Use   Smoking status: Former    Years: 20.00    Types: Cigarettes    Quit date: 08/04/1992    Years since quitting: 28.5   Smokeless tobacco: Never  Vaping Use   Vaping Use: Never used  Substance and Sexual Activity   Alcohol use: Not Currently     Alcohol/week: 0.0 standard drinks   Drug use: Never   Sexual activity: Not Currently  Other Topics Concern   Not on file  Social History Narrative   Concern for pt ability to care for himself. Pt refused placement post hospitalization after rehab   Social Determinants of Health   Financial Resource Strain: Not on file  Food Insecurity: Not on file  Transportation Needs: Not on file  Physical Activity: Not on file  Stress: Not on file  Social Connections: Not on file  Intimate Partner Violence: Not on file    Outpatient Medications Prior to Visit  Medication Sig Dispense Refill   acetaminophen (TYLENOL) 500 MG tablet Take 1-2 tablets (500-1,000 mg total) by mouth every 6 (six) hours as needed. 60 tablet 0   diclofenac Sodium (VOLTAREN) 1 % GEL SMARTSIG:2 Gram(s) Topical 1 to 4 Times Daily     diltiazem (CARDIZEM CD) 120 MG 24 hr capsule TAKE 1 CAPSULE BY MOUTH EVERY DAY 90 capsule 2   erythromycin ophthalmic ointment SMARTSIG:1 Sparingly In Eye(s) Twice Daily     ferrous sulfate 325 (65 FE) MG tablet  Take 325 mg by mouth daily with breakfast.     finasteride (PROSCAR) 5 MG tablet TAKE 1 TABLET BY MOUTH EVERY DAY 90 tablet 2   furosemide (LASIX) 20 MG tablet Take 2 tablets (40 mg total) by mouth daily. 1 tablet 0   ketoconazole (NIZORAL) 2 % shampoo USE AS DIRECTED, ROTATE BETWEEN OVER THE COUNTER SHAMPOOS     Mesalamine 800 MG TBEC TAKE 1 TABLET BY MOUTH TWICE A DAY 180 tablet 2   pantoprazole (PROTONIX) 20 MG tablet TAKE 1 TABLET BY MOUTH EVERY DAY AS DIRECTED 90 tablet 3   simvastatin (ZOCOR) 20 MG tablet TAKE 1 TABLET BY MOUTH EVERY DAY 90 tablet 2   tamsulosin (FLOMAX) 0.4 MG CAPS capsule TAKE 1 CAPSULE BY MOUTH EVERY DAY 90 capsule 2   tolnaftate (TINACTIN) 1 % powder Apply 1 application topically 2 (two) times daily. 45 g 3   TRADJENTA 5 MG TABS tablet TAKE 1 TABLET BY MOUTH EVERY DAY 90 tablet 3   traZODone (DESYREL) 100 MG tablet TAKE 1 TABLET BY MOUTH AT NIGHT 90 tablet 2    triamcinolone cream (KENALOG) 0.1 % APPLY TO AFFECTED AREA TWICE A DAY 454 g 3   levofloxacin (LEVAQUIN) 250 MG tablet Take 1 tablet (250 mg total) by mouth daily. 3 tablet 0   No facility-administered medications prior to visit.    Allergies  Allergen Reactions   Clindamycin/Lincomycin Diarrhea    bloody   Doxycycline    Sulfa Antibiotics Other (See Comments)   Torsemide    Keflet [Cephalexin] Rash    Review of Systems  Constitutional: Negative.  Negative for activity change and appetite change.  HENT:  Negative for congestion.   Eyes:  Negative for visual disturbance.  Respiratory:  Negative for cough and chest tightness.   Cardiovascular:  Negative for chest pain, palpitations and leg swelling.  Gastrointestinal:  Negative for abdominal distention and abdominal pain.  Genitourinary:  Negative for difficulty urinating and dysuria.  Musculoskeletal:  Negative for arthralgias and back pain.  Skin:  Positive for wound.  Neurological: Negative.   Psychiatric/Behavioral: Negative.        Objective:    Physical Exam  BP 140/70   Pulse 61   Temp 97.9 F (36.6 C)   Resp 15   Ht 5' 10"  (1.778 m)   Wt 202 lb (91.6 kg)   SpO2 98%   BMI 28.98 kg/m  Wt Readings from Last 3 Encounters:  02/11/21 202 lb (91.6 kg)  02/02/21 202 lb 12.8 oz (92 kg)  01/13/21 197 lb 9.6 oz (89.6 kg)    Health Maintenance Due  Topic Date Due   COVID-19 Vaccine (1) Never done   OPHTHALMOLOGY EXAM  Never done   Zoster Vaccines- Shingrix (1 of 2) Never done   INFLUENZA VACCINE  01/10/2021    There are no preventive care reminders to display for this patient.   Lab Results  Component Value Date   TSH 0.713 08/05/2014   Lab Results  Component Value Date   WBC 5.6 11/12/2020   HGB 10.5 (L) 02/02/2021   HCT 31.0 (L) 02/02/2021   MCV 87 11/12/2020   PLT 172 11/12/2020   Lab Results  Component Value Date   NA 143 02/02/2021   K 3.6 02/02/2021   CO2 23 11/12/2020   GLUCOSE 140 (H)  02/02/2021   BUN 31 (H) 02/02/2021   CREATININE 1.60 (H) 02/02/2021   BILITOT 0.8 11/12/2020   ALKPHOS 132 (H) 11/12/2020  AST 14 11/12/2020   ALT 12 11/12/2020   PROT 7.1 11/12/2020   ALBUMIN 4.3 11/12/2020   CALCIUM 10.0 11/12/2020   ANIONGAP 8 07/03/2016   EGFR 40 (L) 11/12/2020   Lab Results  Component Value Date   CHOL 159 11/12/2020   Lab Results  Component Value Date   HDL 69 11/12/2020   Lab Results  Component Value Date   LDLCALC 78 11/12/2020   Lab Results  Component Value Date   TRIG 62 11/12/2020   Lab Results  Component Value Date   CHOLHDL 2.3 11/12/2020   Lab Results  Component Value Date   HGBA1C 7.0 (H) 10/05/2020       Assessment & Plan:  1. Cellulitis of left lower leg - ciprofloxacin (CIPRO) 500 MG tablet; Take 1 tablet (500 mg total) by mouth 2 (two) times daily for 10 days.  Dispense: 20 tablet; Refill: 0  Patient has infection of halux and 2nd ans 3rd toe on left leg.  Podiatry cancelled his surgery, dressed and started on antibiotic  Meds ordered this encounter  Medications   ciprofloxacin (CIPRO) 500 MG tablet    Sig: Take 1 tablet (500 mg total) by mouth 2 (two) times daily for 10 days.    Dispense:  20 tablet    Refill:  0        I spent 25 minutes dedicated to the care of this patient on the date of this encounter to include face-to-face time with the patient, as well as:   Follow-up: Return in about 4 days (around 02/15/2021) for infected toes.  An After Visit Summary was printed and given to the patient.  Reinaldo Meeker, MD [facility] [phone]

## 2021-02-15 ENCOUNTER — Ambulatory Visit: Payer: Medicare Other | Admitting: Legal Medicine

## 2021-02-16 ENCOUNTER — Ambulatory Visit: Payer: Medicare Other | Admitting: Legal Medicine

## 2021-02-16 ENCOUNTER — Encounter: Payer: Self-pay | Admitting: Legal Medicine

## 2021-02-16 ENCOUNTER — Other Ambulatory Visit: Payer: Self-pay

## 2021-02-16 VITALS — BP 118/82 | HR 60 | Temp 97.3°F | Ht 70.0 in | Wt 203.0 lb

## 2021-02-16 DIAGNOSIS — I872 Venous insufficiency (chronic) (peripheral): Secondary | ICD-10-CM

## 2021-02-16 DIAGNOSIS — R600 Localized edema: Secondary | ICD-10-CM | POA: Diagnosis not present

## 2021-02-16 DIAGNOSIS — M2041 Other hammer toe(s) (acquired), right foot: Secondary | ICD-10-CM

## 2021-02-16 DIAGNOSIS — L03116 Cellulitis of left lower limb: Secondary | ICD-10-CM | POA: Diagnosis not present

## 2021-02-16 DIAGNOSIS — M2042 Other hammer toe(s) (acquired), left foot: Secondary | ICD-10-CM

## 2021-02-16 DIAGNOSIS — L03115 Cellulitis of right lower limb: Secondary | ICD-10-CM | POA: Diagnosis not present

## 2021-02-16 NOTE — Progress Notes (Signed)
Established Patient Office Visit  Subjective:  Patient ID: Glen Lee, male    DOB: 12/04/1933  Age: 87 y.o. MRN: [id number]  CC:  Chief Complaint  Patient presents with   Cellulitis     HPI Glen Lee presents for follow up of cellulitis toes left leg, now feet and legs are swollen and some oozing present .  He is still on cipro.  Past Medical History:  Diagnosis Date   Anemia    Arthritis    Benign prostate hyperplasia    Diabetes mellitus without complication (HCC)    Edema    right leg   Enlarged prostate    Esophageal reflux    Essential hypertension    HOH (hard of hearing)    wears hearing aid   Hyperlipidemia    Hypertension    Mixed hyperlipidemia    Tremor    right hand   Type 2 diabetes mellitus with other specified complication (HCC)    Vitamin D deficiency     Past Surgical History:  Procedure Laterality Date   JOINT REPLACEMENT Left    knee   KNEE SURGERY     ORIF ANKLE FRACTURE Right 06/30/2016   Procedure: OPEN REDUCTION INTERNAL FIXATION (ORIF) ANKLE FRACTURE;  Surgeon: Altamese [place], MD;  Location: [facility];  Service: Orthopedics;  Laterality: Right;    Family History  Problem Relation Age of Onset   Hyperlipidemia Mother    Diabetes Mother    Colon cancer Father    Diabetes Father     Social History   Socioeconomic History   Marital status: Single    Spouse name: Not on file   Number of children: 3   Years of education: Not on file   Highest education level: Not on file  Occupational History   Not on file  Tobacco Use   Smoking status: Former    Years: 20.00    Types: Cigarettes    Quit date: 08/04/1992    Years since quitting: 28.5   Smokeless tobacco: Never  Vaping Use   Vaping Use: Never used  Substance and Sexual Activity   Alcohol use: Not Currently    Alcohol/week: 0.0 standard drinks   Drug use: Never   Sexual activity: Not Currently  Other Topics Concern   Not on file  Social History Narrative   Concern  for pt ability to care for himself. Pt refused placement post hospitalization after rehab   Social Determinants of Health   Financial Resource Strain: Not on file  Food Insecurity: Not on file  Transportation Needs: Not on file  Physical Activity: Not on file  Stress: Not on file  Social Connections: Not on file  Intimate Partner Violence: Not on file    Outpatient Medications Prior to Visit  Medication Sig Dispense Refill   acetaminophen (TYLENOL) 500 MG tablet Take 1-2 tablets (500-1,000 mg total) by mouth every 6 (six) hours as needed. 60 tablet 0   ciprofloxacin (CIPRO) 500 MG tablet Take 1 tablet (500 mg total) by mouth 2 (two) times daily for 10 days. 20 tablet 0   diclofenac Sodium (VOLTAREN) 1 % GEL SMARTSIG:2 Gram(s) Topical 1 to 4 Times Daily     diltiazem (CARDIZEM CD) 120 MG 24 hr capsule TAKE 1 CAPSULE BY MOUTH EVERY DAY 90 capsule 2   erythromycin ophthalmic ointment SMARTSIG:1 Sparingly In Eye(s) Twice Daily     ferrous sulfate 325 (65 FE) MG tablet Take 325 mg by mouth daily with breakfast.  finasteride (PROSCAR) 5 MG tablet TAKE 1 TABLET BY MOUTH EVERY DAY 90 tablet 2   furosemide (LASIX) 20 MG tablet Take 2 tablets (40 mg total) by mouth daily. 1 tablet 0   ketoconazole (NIZORAL) 2 % shampoo USE AS DIRECTED, ROTATE BETWEEN OVER THE COUNTER SHAMPOOS     Mesalamine 800 MG TBEC TAKE 1 TABLET BY MOUTH TWICE A DAY 180 tablet 2   pantoprazole (PROTONIX) 20 MG tablet TAKE 1 TABLET BY MOUTH EVERY DAY AS DIRECTED 90 tablet 3   simvastatin (ZOCOR) 20 MG tablet TAKE 1 TABLET BY MOUTH EVERY DAY 90 tablet 2   tamsulosin (FLOMAX) 0.4 MG CAPS capsule TAKE 1 CAPSULE BY MOUTH EVERY DAY 90 capsule 2   tolnaftate (TINACTIN) 1 % powder Apply 1 application topically 2 (two) times daily. 45 g 3   TRADJENTA 5 MG TABS tablet TAKE 1 TABLET BY MOUTH EVERY DAY 90 tablet 3   traZODone (DESYREL) 100 MG tablet TAKE 1 TABLET BY MOUTH AT NIGHT 90 tablet 2   triamcinolone cream (KENALOG) 0.1 %  APPLY TO AFFECTED AREA TWICE A DAY 454 g 3   No facility-administered medications prior to visit.    Allergies  Allergen Reactions   Clindamycin/Lincomycin Diarrhea    bloody   Doxycycline    Sulfa Antibiotics Other (See Comments)   Torsemide    Keflet [Cephalexin] Rash    ROS Review of Systems  Constitutional:  Negative for chills, fatigue and fever.  HENT:  Negative for congestion, ear pain and sore throat.   Respiratory:  Negative for cough and shortness of breath.   Cardiovascular:  Negative for chest pain.  Gastrointestinal:  Negative for abdominal pain, constipation, diarrhea, nausea and vomiting.  Endocrine: Negative for polydipsia, polyphagia and polyuria.  Genitourinary:  Negative for dysuria and frequency.  Musculoskeletal:  Negative for arthralgias and myalgias.  Skin: Negative.   Neurological:  Negative for dizziness and headaches.  Psychiatric/Behavioral:  Negative for dysphoric mood.        No dysphoria     Objective:    Physical Exam Vitals reviewed.  Constitutional:      Appearance: Normal appearance.  HENT:     Right Ear: Tympanic membrane normal.     Left Ear: Tympanic membrane normal.  Eyes:     Extraocular Movements: Extraocular movements intact.     Conjunctiva/sclera: Conjunctivae normal.     Pupils: Pupils are equal, round, and reactive to light.  Cardiovascular:     Rate and Rhythm: Normal rate and regular rhythm.     Pulses: Normal pulses.     Heart sounds: Normal heart sounds. No murmur heard.   No gallop.  Pulmonary:     Effort: Pulmonary effort is normal. No respiratory distress.     Breath sounds: Normal breath sounds. No wheezing.  Musculoskeletal:     Cervical back: Normal range of motion.     Right lower leg: No tenderness. Edema present.     Left lower leg: No tenderness. Edema present.       Legs:     Comments: Healing cellulitis left foot, dead skin removed  Skin:    Capillary Refill: Capillary refill takes less than 2  seconds.     Findings: Erythema (left leg) present.  Neurological:     General: No focal deficit present.     Mental Status: He is alert and oriented to person, place, and time.    BP 118/82   Pulse 60   Temp (!) 97.3 F (  36.3 C)   Ht 5' 10"  (1.778 m)   Wt 203 lb (92.1 kg)   SpO2 97%   BMI 29.13 kg/m  Wt Readings from Last 3 Encounters:  02/16/21 203 lb (92.1 kg)  02/11/21 202 lb (91.6 kg)  02/02/21 202 lb 12.8 oz (92 kg)     Health Maintenance Due  Topic Date Due   COVID-19 Vaccine (1) Never done   OPHTHALMOLOGY EXAM  Never done   Zoster Vaccines- Shingrix (1 of 2) Never done   INFLUENZA VACCINE  01/10/2021    There are no preventive care reminders to display for this patient.  Lab Results  Component Value Date   TSH 0.713 08/05/2014   Lab Results  Component Value Date   WBC 5.6 11/12/2020   HGB 10.5 (L) 02/02/2021   HCT 31.0 (L) 02/02/2021   MCV 87 11/12/2020   PLT 172 11/12/2020   Lab Results  Component Value Date   NA 143 02/02/2021   K 3.6 02/02/2021   CO2 23 11/12/2020   GLUCOSE 140 (H) 02/02/2021   BUN 31 (H) 02/02/2021   CREATININE 1.60 (H) 02/02/2021   BILITOT 0.8 11/12/2020   ALKPHOS 132 (H) 11/12/2020   AST 14 11/12/2020   ALT 12 11/12/2020   PROT 7.1 11/12/2020   ALBUMIN 4.3 11/12/2020   CALCIUM 10.0 11/12/2020   ANIONGAP 8 07/03/2016   EGFR 40 (L) 11/12/2020   Lab Results  Component Value Date   CHOL 159 11/12/2020   Lab Results  Component Value Date   HDL 69 11/12/2020   Lab Results  Component Value Date   LDLCALC 78 11/12/2020   Lab Results  Component Value Date   TRIG 62 11/12/2020   Lab Results  Component Value Date   CHOLHDL 2.3 11/12/2020   Lab Results  Component Value Date   HGBA1C 7.0 (H) 10/05/2020      Assessment & Plan:   Problem List Items Addressed This Visit       Cardiovascular and Mediastinum   Chronic venous insufficiency Patient has chronic venous insufficiency     Musculoskeletal and  Integument   Hammer toes of both feet pAtient needs hammer toe surgery , that has been delay     Other   Cellulitis of right lower extremity Cellulitis doing well, infection under control    Cellulitis of left lower leg - Primary weePING EDEMA LEFT LEG, PUT ON UNNA boot    Edema of right lower leg Unna boot applied       Follow-up: Return in about 1 week (around 02/23/2021) for edema legs.    Reinaldo Meeker, MD

## 2021-02-21 ENCOUNTER — Encounter: Payer: Medicare Other | Admitting: Podiatry

## 2021-02-23 ENCOUNTER — Other Ambulatory Visit: Payer: Self-pay

## 2021-02-23 ENCOUNTER — Ambulatory Visit: Payer: Medicare Other | Admitting: Legal Medicine

## 2021-02-23 ENCOUNTER — Encounter: Payer: Self-pay | Admitting: Legal Medicine

## 2021-02-23 VITALS — BP 138/76 | HR 71 | Temp 97.5°F | Ht 70.0 in | Wt 204.2 lb

## 2021-02-23 DIAGNOSIS — L03116 Cellulitis of left lower limb: Secondary | ICD-10-CM | POA: Diagnosis not present

## 2021-02-23 NOTE — Progress Notes (Signed)
Established Patient Office Visit  Subjective:  Patient ID: Glen Lee, male    DOB: [birth year]  Age: 87 y.o. MRN: [id number]  CC:  Chief Complaint  Patient presents with   Cellulitis    1 week follow up.    HPI CHAI ROUTH presents for follow up stasis edema both legs and infected left toes.  Podiatry plans to fix hammer toes.  They are to see him on Monday.  Past Medical History:  Diagnosis Date   Anemia    Arthritis    Benign prostate hyperplasia    Diabetes mellitus without complication (HCC)    Edema    right leg   Enlarged prostate    Esophageal reflux    Essential hypertension    HOH (hard of hearing)    wears hearing aid   Hyperlipidemia    Hypertension    Mixed hyperlipidemia    Tremor    right hand   Type 2 diabetes mellitus with other specified complication (HCC)    Vitamin D deficiency     Past Surgical History:  Procedure Laterality Date   JOINT REPLACEMENT Left    knee   KNEE SURGERY     ORIF ANKLE FRACTURE Right 06/30/2016   Procedure: OPEN REDUCTION INTERNAL FIXATION (ORIF) ANKLE FRACTURE;  Surgeon: Altamese [place], MD;  Location: [place];  Service: Orthopedics;  Laterality: Right;    Family History  Problem Relation Age of Onset   Hyperlipidemia Mother    Diabetes Mother    Colon cancer Father    Diabetes Father     Social History   Socioeconomic History   Marital status: Single    Spouse name: Not on file   Number of children: 3   Years of education: Not on file   Highest education level: Not on file  Occupational History   Not on file  Tobacco Use   Smoking status: Former    Years: 20.00    Types: Cigarettes    Quit date: 08/04/1992    Years since quitting: 28.5   Smokeless tobacco: Never  Vaping Use   Vaping Use: Never used  Substance and Sexual Activity   Alcohol use: Not Currently    Alcohol/week: 0.0 standard drinks   Drug use: Never   Sexual activity: Not Currently  Other Topics Concern   Not on file  Social  History Narrative   Concern for pt ability to care for himself. Pt refused placement post hospitalization after rehab   Social Determinants of Health   Financial Resource Strain: Not on file  Food Insecurity: Not on file  Transportation Needs: Not on file  Physical Activity: Not on file  Stress: Not on file  Social Connections: Not on file  Intimate Partner Violence: Not on file    Outpatient Medications Prior to Visit  Medication Sig Dispense Refill   acetaminophen (TYLENOL) 500 MG tablet Take 1-2 tablets (500-1,000 mg total) by mouth every 6 (six) hours as needed. 60 tablet 0   diclofenac Sodium (VOLTAREN) 1 % GEL SMARTSIG:2 Gram(s) Topical 1 to 4 Times Daily     diltiazem (CARDIZEM CD) 120 MG 24 hr capsule TAKE 1 CAPSULE BY MOUTH EVERY DAY 90 capsule 2   erythromycin ophthalmic ointment SMARTSIG:1 Sparingly In Eye(s) Twice Daily     ferrous sulfate 325 (65 FE) MG tablet Take 325 mg by mouth daily with breakfast.     finasteride (PROSCAR) 5 MG tablet TAKE 1 TABLET BY MOUTH EVERY DAY 90 tablet  2   furosemide (LASIX) 20 MG tablet Take 2 tablets (40 mg total) by mouth daily. 1 tablet 0   ketoconazole (NIZORAL) 2 % shampoo USE AS DIRECTED, ROTATE BETWEEN OVER THE COUNTER SHAMPOOS     Mesalamine 800 MG TBEC TAKE 1 TABLET BY MOUTH TWICE A DAY 180 tablet 2   pantoprazole (PROTONIX) 20 MG tablet TAKE 1 TABLET BY MOUTH EVERY DAY AS DIRECTED 90 tablet 3   simvastatin (ZOCOR) 20 MG tablet TAKE 1 TABLET BY MOUTH EVERY DAY 90 tablet 2   tamsulosin (FLOMAX) 0.4 MG CAPS capsule TAKE 1 CAPSULE BY MOUTH EVERY DAY 90 capsule 2   tolnaftate (TINACTIN) 1 % powder Apply 1 application topically 2 (two) times daily. 45 g 3   TRADJENTA 5 MG TABS tablet TAKE 1 TABLET BY MOUTH EVERY DAY 90 tablet 3   traZODone (DESYREL) 100 MG tablet TAKE 1 TABLET BY MOUTH AT NIGHT 90 tablet 2   triamcinolone cream (KENALOG) 0.1 % APPLY TO AFFECTED AREA TWICE A DAY 454 g 3   No facility-administered medications prior to  visit.    Allergies  Allergen Reactions   Clindamycin/Lincomycin Diarrhea    bloody   Doxycycline    Sulfa Antibiotics Other (See Comments)   Torsemide    Keflet [Cephalexin] Rash    ROS Review of Systems  Constitutional:  Negative for chills, fatigue and fever.  HENT:  Negative for congestion, ear pain and sore throat.   Respiratory:  Negative for cough and shortness of breath.   Cardiovascular:  Negative for chest pain.  Gastrointestinal:  Negative for abdominal pain, constipation, diarrhea, nausea and vomiting.  Endocrine: Negative for polydipsia, polyphagia and polyuria.  Genitourinary:  Negative for dysuria and frequency.  Musculoskeletal:  Negative for arthralgias and myalgias.  Neurological:  Negative for dizziness and headaches.  Psychiatric/Behavioral:  Negative for dysphoric mood.        No dysphoria     Objective:    Physical Exam Vitals reviewed.  Constitutional:      Appearance: Normal appearance.  HENT:     Right Ear: Tympanic membrane normal.     Left Ear: Tympanic membrane normal.  Cardiovascular:     Rate and Rhythm: Normal rate.     Pulses: Normal pulses.     Heart sounds: Normal heart sounds. No murmur heard.   No gallop.  Pulmonary:     Effort: Pulmonary effort is normal. No respiratory distress.     Breath sounds: No wheezing.  Skin:    General: Skin is warm.     Capillary Refill: Capillary refill takes less than 2 seconds.     Comments: Legs healed, no drainage, the left toes have healed well, ok for surgery  Neurological:     Mental Status: He is alert.     Sensory: Sensory deficit (numb feet) present.    BP 138/76   Pulse 71   Temp (!) 97.5 F (36.4 C)   Ht 5' 10"  (1.778 m)   Wt 204 lb 3.2 oz (92.6 kg)   SpO2 98%   BMI 29.30 kg/m  Wt Readings from Last 3 Encounters:  02/23/21 204 lb 3.2 oz (92.6 kg)  02/16/21 203 lb (92.1 kg)  02/11/21 202 lb (91.6 kg)     Health Maintenance Due  Topic Date Due   COVID-19 Vaccine (1)  Never done   OPHTHALMOLOGY EXAM  Never done   Zoster Vaccines- Shingrix (1 of 2) Never done   INFLUENZA VACCINE  01/10/2021  There are no preventive care reminders to display for this patient.  Lab Results  Component Value Date   TSH 0.713 08/05/2014   Lab Results  Component Value Date   WBC 5.6 11/12/2020   HGB 10.5 (L) 02/02/2021   HCT 31.0 (L) 02/02/2021   MCV 87 11/12/2020   PLT 172 11/12/2020   Lab Results  Component Value Date   NA 143 02/02/2021   K 3.6 02/02/2021   CO2 23 11/12/2020   GLUCOSE 140 (H) 02/02/2021   BUN 31 (H) 02/02/2021   CREATININE 1.60 (H) 02/02/2021   BILITOT 0.8 11/12/2020   ALKPHOS 132 (H) 11/12/2020   AST 14 11/12/2020   ALT 12 11/12/2020   PROT 7.1 11/12/2020   ALBUMIN 4.3 11/12/2020   CALCIUM 10.0 11/12/2020   ANIONGAP 8 07/03/2016   EGFR 40 (L) 11/12/2020   Lab Results  Component Value Date   CHOL 159 11/12/2020   Lab Results  Component Value Date   HDL 69 11/12/2020   Lab Results  Component Value Date   LDLCALC 78 11/12/2020   Lab Results  Component Value Date   TRIG 62 11/12/2020   Lab Results  Component Value Date   CHOLHDL 2.3 11/12/2020   Lab Results  Component Value Date   HGBA1C 7.0 (H) 10/05/2020      Assessment & Plan:   Problem List Items Addressed This Visit       Other   Cellulitis of left lower leg - Primary  Both leg edema has reoslved and there is no skin weeping.  The left toes are well healed, he can see podiatry on Monday to schedule his hammer toe repain    Follow-up: Return in about 3 months (around 05/25/2021).    Reinaldo Meeker, MD

## 2021-02-24 ENCOUNTER — Other Ambulatory Visit: Payer: Self-pay | Admitting: Legal Medicine

## 2021-02-24 DIAGNOSIS — E1142 Type 2 diabetes mellitus with diabetic polyneuropathy: Secondary | ICD-10-CM

## 2021-02-28 ENCOUNTER — Other Ambulatory Visit: Payer: Self-pay

## 2021-02-28 ENCOUNTER — Ambulatory Visit: Payer: Medicare Other | Admitting: Podiatry

## 2021-02-28 DIAGNOSIS — M2041 Other hammer toe(s) (acquired), right foot: Secondary | ICD-10-CM | POA: Diagnosis not present

## 2021-02-28 DIAGNOSIS — E1151 Type 2 diabetes mellitus with diabetic peripheral angiopathy without gangrene: Secondary | ICD-10-CM | POA: Diagnosis not present

## 2021-02-28 DIAGNOSIS — M2042 Other hammer toe(s) (acquired), left foot: Secondary | ICD-10-CM

## 2021-02-28 DIAGNOSIS — N184 Chronic kidney disease, stage 4 (severe): Secondary | ICD-10-CM | POA: Diagnosis not present

## 2021-02-28 NOTE — Progress Notes (Signed)
  Subjective:  Patient ID: Glen Lee, male    DOB: 14-Sep-1933,  MRN: [id number]  Chief Complaint  Patient presents with   surgery    Reschedule sx    DOS: 07/21/19 Procedure: Flexor tenotomy Left 2nd/3rd toes  87 y.o. male presents with the above complaint. History confirmed with patient. Has not been wearing his surgical shoe - states that it hurts to much to do so. Feels more comfortable in sneakers. Thinks the infection he had is doing much better. Objective:  Physical Exam: Wound Location: left 2nd toe Wound Measurement: 0.2x0.3. Wound Base: Granular/Healthy Peri-wound: Calloused Exudate: None: wound tissue dry wound without warmth, erythema, signs of acute infection  Rigid contracture bilateral 2nd, 3rd toes.   Right 2nd toe with pre-ulcerative lesion - no open uleration today.  Assessment:   1. Hammer toes of both feet   2. Diabetes mellitus type 2 with peripheral artery disease ([place])   3. CKD (chronic kidney disease) stage 4, GFR 15-29 ml/min (HCC)    Plan:  Patient was evaluated and treated and all questions answered.  Hammertoes bilat, with ulcer left 2nd toe -The superficial infection appears resolved. He has recurrence of ulceration to the tip of the toe. This needs to be healed prior to surgical intervention. I remain concerned he is not wearing the surgical shoe. I encouraged him to wear this as much as he can - as he will not be able to wear his sneakers post-operatively. He continues to have pain recalcitrant to other treatments and so I think correction would be beneficial though not without risk. He remains at a risk of ulceration if the ulcerations continue, which would present the same issues with post-op healing and stability. Will await healing of the ulceration and then discuss further.  Onychomycosis, DM, CKD -Nails debrided x10   Return in about 3 weeks (around 03/21/2021) for Wound Care.

## 2021-03-07 ENCOUNTER — Other Ambulatory Visit: Payer: Self-pay

## 2021-03-07 ENCOUNTER — Encounter: Payer: Self-pay | Admitting: Legal Medicine

## 2021-03-07 ENCOUNTER — Ambulatory Visit: Payer: Medicare Other | Admitting: Legal Medicine

## 2021-03-07 VITALS — BP 132/74 | HR 71 | Temp 98.6°F | Resp 16 | Ht 70.0 in | Wt 203.0 lb

## 2021-03-07 DIAGNOSIS — L03116 Cellulitis of left lower limb: Secondary | ICD-10-CM

## 2021-03-07 MED ORDER — CIPROFLOXACIN HCL 500 MG PO TABS: 500.0000 mg | ORAL_TABLET | Freq: Two times a day (BID) | ORAL | 1 refills | Status: AC

## 2021-03-07 NOTE — Progress Notes (Signed)
Acute Office Visit  Subjective:    Patient ID: Glen Lee, male    DOB: November 19, 1933, 87 y.o.   MRN: [id number]  Chief Complaint  Patient presents with   Nail Problem   Leg Swelling    HPI Patient is in today for recurrent cellulitis left 2nd toe for 2 da ys, with drainage.  The area was clean, the 2nd toe is red and swollen.  Nontender, no purulence.  Past Medical History:  Diagnosis Date   Anemia    Arthritis    Benign prostate hyperplasia    Diabetes mellitus without complication (HCC)    Edema    right leg   Enlarged prostate    Esophageal reflux    Essential hypertension    HOH (hard of hearing)    wears hearing aid   Hyperlipidemia    Hypertension    Mixed hyperlipidemia    Tremor    right hand   Type 2 diabetes mellitus with other specified complication (HCC)    Vitamin D deficiency     Past Surgical History:  Procedure Laterality Date   JOINT REPLACEMENT Left    knee   KNEE SURGERY     ORIF ANKLE FRACTURE Right 06/30/2016   Procedure: OPEN REDUCTION INTERNAL FIXATION (ORIF) ANKLE FRACTURE;  Surgeon: Altamese Loretto, MD;  Location: [place];  Service: Orthopedics;  Laterality: Right;    Family History  Problem Relation Age of Onset   Hyperlipidemia Mother    Diabetes Mother    Colon cancer Father    Diabetes Father     Social History   Socioeconomic History   Marital status: Single    Spouse name: Not on file   Number of children: 3   Years of education: Not on file   Highest education level: Not on file  Occupational History   Not on file  Tobacco Use   Smoking status: Former    Years: 20.00    Types: Cigarettes    Quit date: 08/04/1992    Years since quitting: 28.6   Smokeless tobacco: Never  Vaping Use   Vaping Use: Never used  Substance and Sexual Activity   Alcohol use: Not Currently    Alcohol/week: 0.0 standard drinks   Drug use: Never   Sexual activity: Not Currently  Other Topics Concern   Not on file  Social History  Narrative   Concern for pt ability to care for himself. Pt refused placement post hospitalization after rehab   Social Determinants of Health   Financial Resource Strain: Not on file  Food Insecurity: Not on file  Transportation Needs: Not on file  Physical Activity: Not on file  Stress: Not on file  Social Connections: Not on file  Intimate Partner Violence: Not on file    Outpatient Medications Prior to Visit  Medication Sig Dispense Refill   acetaminophen (TYLENOL) 500 MG tablet Take 1-2 tablets (500-1,000 mg total) by mouth every 6 (six) hours as needed. 60 tablet 0   diclofenac Sodium (VOLTAREN) 1 % GEL SMARTSIG:2 Gram(s) Topical 1 to 4 Times Daily     diltiazem (CARDIZEM CD) 120 MG 24 hr capsule TAKE 1 CAPSULE BY MOUTH EVERY DAY 90 capsule 2   erythromycin ophthalmic ointment SMARTSIG:1 Sparingly In Eye(s) Twice Daily     ferrous sulfate 325 (65 FE) MG tablet Take 325 mg by mouth daily with breakfast.     finasteride (PROSCAR) 5 MG tablet TAKE 1 TABLET BY MOUTH EVERY DAY 90 tablet 2  furosemide (LASIX) 20 MG tablet Take 2 tablets (40 mg total) by mouth daily. 1 tablet 0   ketoconazole (NIZORAL) 2 % shampoo USE AS DIRECTED, ROTATE BETWEEN OVER THE COUNTER SHAMPOOS     Mesalamine 800 MG TBEC TAKE 1 TABLET BY MOUTH TWICE A DAY 180 tablet 2   pantoprazole (PROTONIX) 20 MG tablet TAKE 1 TABLET BY MOUTH EVERY DAY AS DIRECTED 90 tablet 3   simvastatin (ZOCOR) 20 MG tablet TAKE 1 TABLET BY MOUTH EVERY DAY 90 tablet 2   tamsulosin (FLOMAX) 0.4 MG CAPS capsule TAKE 1 CAPSULE BY MOUTH EVERY DAY 90 capsule 2   tolnaftate (TINACTIN) 1 % powder Apply 1 application topically 2 (two) times daily. 45 g 3   TRADJENTA 5 MG TABS tablet TAKE 1 TABLET BY MOUTH EVERY DAY 90 tablet 3   traZODone (DESYREL) 100 MG tablet TAKE 1 TABLET BY MOUTH AT NIGHT 90 tablet 2   triamcinolone cream (KENALOG) 0.1 % APPLY TO AFFECTED AREA TWICE A DAY 454 g 3   No facility-administered medications prior to visit.     Allergies  Allergen Reactions   Clindamycin/Lincomycin Diarrhea    bloody   Doxycycline    Sulfa Antibiotics Other (See Comments)   Torsemide    Keflet [Cephalexin] Rash    Review of Systems  Constitutional:  Negative for chills, fatigue and fever.  HENT:  Negative for congestion, ear pain and sore throat.   Respiratory:  Negative for cough and shortness of breath.   Cardiovascular:  Negative for chest pain.  Gastrointestinal:  Negative for abdominal pain, constipation, diarrhea, nausea and vomiting.  Endocrine: Negative for polydipsia, polyphagia and polyuria.  Genitourinary:  Negative for dysuria and frequency.  Musculoskeletal:  Negative for arthralgias and myalgias.  Neurological:  Negative for dizziness and headaches.  Psychiatric/Behavioral:  Negative for dysphoric mood.        No dysphoria      Objective:    Physical Exam Vitals reviewed.  Constitutional:      Appearance: Normal appearance.  HENT:     Right Ear: Tympanic membrane normal.     Left Ear: Tympanic membrane normal.  Cardiovascular:     Rate and Rhythm: Normal rate and regular rhythm.     Pulses: Normal pulses.     Heart sounds: Normal heart sounds.  Musculoskeletal:     Comments: Edema and breakdown of skin left leg, the toes are red and poor capillary refill, I cleaned between toes, no open wounds.  Leg dressed with unna boot  Neurological:     Mental Status: He is alert.    BP 132/74   Pulse 71   Temp 98.6 F (37 C)   Resp 16   Ht _0  (1.778 m)   Wt 203 lb (92.1 kg)   SpO2 98%   BMI 29.13 kg/m  Wt Readings from Last 3 Encounters:  03/07/21 203 lb (92.1 kg)  02/23/21 204 lb 3.2 oz (92.6 kg)  02/16/21 203 lb (92.1 kg)    Health Maintenance Due  Topic Date Due   COVID-19 Vaccine (1) Never done   OPHTHALMOLOGY EXAM  Never done   Zoster Vaccines- Shingrix (1 of 2) Never done   INFLUENZA VACCINE  01/10/2021    There are no preventive care reminders to display for this  patient.   Lab Results  Component Value Date   TSH 0.713 08/05/2014   Lab Results  Component Value Date   WBC 5.6 11/12/2020   HGB 10.5 (L) 02/02/2021  HCT 31.0 (L) 02/02/2021   MCV 87 11/12/2020   PLT 172 11/12/2020   Lab Results  Component Value Date   NA 143 02/02/2021   K 3.6 02/02/2021   CO2 23 11/12/2020   GLUCOSE 140 (H) 02/02/2021   BUN 31 (H) 02/02/2021   CREATININE 1.60 (H) 02/02/2021   BILITOT 0.8 11/12/2020   ALKPHOS 132 (H) 11/12/2020   AST 14 11/12/2020   ALT 12 11/12/2020   PROT 7.1 11/12/2020   ALBUMIN 4.3 11/12/2020   CALCIUM 10.0 11/12/2020   ANIONGAP 8 07/03/2016   EGFR 40 (L) 11/12/2020   Lab Results  Component Value Date   CHOL 159 11/12/2020   Lab Results  Component Value Date   HDL 69 11/12/2020   Lab Results  Component Value Date   LDLCALC 78 11/12/2020   Lab Results  Component Value Date   TRIG 62 11/12/2020   Lab Results  Component Value Date   CHOLHDL 2.3 11/12/2020   Lab Results  Component Value Date   HGBA1C 7.0 (H) 10/05/2020       Assessment & Plan:  1. Cellulitis of left lower leg - ciprofloxacin (CIPRO) 500 MG tablet; Take 1 tablet (500 mg total) by mouth 2 (two) times daily for 10 days.  Dispense: 20 tablet; Refill: 1  Cleaned foot left, unna boot applied and remove in one week, nonstick dressings, restart antibiotics.  Meds ordered this encounter  Medications   ciprofloxacin (CIPRO) 500 MG tablet    Sig: Take 1 tablet (500 mg total) by mouth 2 (two) times daily for 10 days.    Dispense:  20 tablet    Refill:  1    No orders of the defined types were placed in this encounter.    I spent 20 minutes dedicated to the care of this patient on the date of this encounter to include face-to-face time with the patient, as well as:   Follow-up: Return in about 1 week (around 03/14/2021) for dr. cox.  An After Visit Summary was printed and given to the patient.  Reinaldo Meeker, MD [facility] [phone]

## 2021-03-14 ENCOUNTER — Ambulatory Visit: Payer: Medicare Other | Admitting: Family Medicine

## 2021-03-14 ENCOUNTER — Encounter: Payer: Self-pay | Admitting: Family Medicine

## 2021-03-14 ENCOUNTER — Other Ambulatory Visit: Payer: Self-pay

## 2021-03-14 VITALS — BP 124/60 | HR 72 | Temp 96.4°F | Resp 18 | Ht 70.0 in | Wt 203.0 lb

## 2021-03-14 DIAGNOSIS — R600 Localized edema: Secondary | ICD-10-CM

## 2021-03-14 DIAGNOSIS — L03116 Cellulitis of left lower limb: Secondary | ICD-10-CM

## 2021-03-14 NOTE — Assessment & Plan Note (Signed)
Unna boot.

## 2021-03-14 NOTE — Assessment & Plan Note (Signed)
Unna boot reapplied.  Continue cipro.

## 2021-03-14 NOTE — Progress Notes (Signed)
Subjective:  Patient ID: Glen Lee, male    DOB: [birth year]  Age: 87 y.o. MRN: [id number]  Chief Complaint  Patient presents with   Leg Swelling    HPI Is a 87 year old white male with type 2 diabetes, atrial fibrillation, peripheral artery disease, who presents for follow-up of cellulitis of his left foot as well as a skin stage II ulceration of the left upper distal limb.  Patient was given Cipro.  Patient had Unna boot x1 week.  I saw the patient with Lemmie Evens, CMA Dr. Blanch Media nurse.  Swelling has improved.  Redness in his foot is approximately the same.  No drainage.  Current Outpatient Medications on File Prior to Visit  Medication Sig Dispense Refill   ciprofloxacin (CIPRO) 500 MG tablet Take 1 tablet (500 mg total) by mouth 2 (two) times daily for 10 days. 20 tablet 1   diclofenac Sodium (VOLTAREN) 1 % GEL SMARTSIG:2 Gram(s) Topical 1 to 4 Times Daily     diltiazem (CARDIZEM CD) 120 MG 24 hr capsule TAKE 1 CAPSULE BY MOUTH EVERY DAY 90 capsule 2   erythromycin ophthalmic ointment SMARTSIG:1 Sparingly In Eye(s) Twice Daily     ferrous sulfate 325 (65 FE) MG tablet Take 325 mg by mouth daily with breakfast.     finasteride (PROSCAR) 5 MG tablet TAKE 1 TABLET BY MOUTH EVERY DAY 90 tablet 2   furosemide (LASIX) 20 MG tablet Take 2 tablets (40 mg total) by mouth daily. 1 tablet 0   ketoconazole (NIZORAL) 2 % shampoo USE AS DIRECTED, ROTATE BETWEEN OVER THE COUNTER SHAMPOOS     Mesalamine 800 MG TBEC TAKE 1 TABLET BY MOUTH TWICE A DAY 180 tablet 2   pantoprazole (PROTONIX) 20 MG tablet TAKE 1 TABLET BY MOUTH EVERY DAY AS DIRECTED 90 tablet 3   simvastatin (ZOCOR) 20 MG tablet TAKE 1 TABLET BY MOUTH EVERY DAY 90 tablet 2   tamsulosin (FLOMAX) 0.4 MG CAPS capsule TAKE 1 CAPSULE BY MOUTH EVERY DAY 90 capsule 2   tolnaftate (TINACTIN) 1 % powder Apply 1 application topically 2 (two) times daily. 45 g 3   TRADJENTA 5 MG TABS tablet TAKE 1 TABLET BY MOUTH EVERY DAY 90 tablet 3    traZODone (DESYREL) 100 MG tablet TAKE 1 TABLET BY MOUTH AT NIGHT 90 tablet 2   triamcinolone cream (KENALOG) 0.1 % APPLY TO AFFECTED AREA TWICE A DAY 454 g 3   No current facility-administered medications on file prior to visit.   Past Medical History:  Diagnosis Date   Anemia    Arthritis    Benign prostate hyperplasia    Diabetes mellitus without complication (HCC)    Edema    right leg   Enlarged prostate    Esophageal reflux    Essential hypertension    HOH (hard of hearing)    wears hearing aid   Hyperlipidemia    Hypertension    Mixed hyperlipidemia    Tremor    right hand   Type 2 diabetes mellitus with other specified complication (HCC)    Vitamin D deficiency    Past Surgical History:  Procedure Laterality Date   JOINT REPLACEMENT Left    knee   KNEE SURGERY     ORIF ANKLE FRACTURE Right 06/30/2016   Procedure: OPEN REDUCTION INTERNAL FIXATION (ORIF) ANKLE FRACTURE;  Surgeon: Altamese Ames, MD;  Location: [place];  Service: Orthopedics;  Laterality: Right;    Family History  Problem Relation Age of Onset  Hyperlipidemia Mother    Diabetes Mother    Colon cancer Father    Diabetes Father    Social History   Socioeconomic History   Marital status: Single    Spouse name: Not on file   Number of children: 3   Years of education: Not on file   Highest education level: Not on file  Occupational History   Not on file  Tobacco Use   Smoking status: Former    Years: 20.00    Types: Cigarettes    Quit date: 08/04/1992    Years since quitting: 28.6   Smokeless tobacco: Never  Vaping Use   Vaping Use: Never used  Substance and Sexual Activity   Alcohol use: Not Currently    Alcohol/week: 0.0 standard drinks   Drug use: Never   Sexual activity: Not Currently  Other Topics Concern   Not on file  Social History Narrative   Concern for pt ability to care for himself. Pt refused placement post hospitalization after rehab   Social Determinants of Health    Financial Resource Strain: Not on file  Food Insecurity: Not on file  Transportation Needs: Not on file  Physical Activity: Not on file  Stress: Not on file  Social Connections: Not on file    Review of Systems  Constitutional:  Negative for chills and fever.  HENT:  Negative for congestion, rhinorrhea and sore throat.   Respiratory:  Negative for cough and shortness of breath.   Cardiovascular:  Positive for leg swelling. Negative for chest pain and palpitations.  Gastrointestinal:  Negative for abdominal pain, constipation, diarrhea, nausea and vomiting.  Genitourinary:  Negative for dysuria and urgency.  Musculoskeletal:  Negative for arthralgias, back pain and myalgias.  Neurological:  Negative for dizziness and headaches.  Psychiatric/Behavioral:  Negative for dysphoric mood. The patient is not nervous/anxious.     Objective:  BP 124/60   Pulse 72   Temp (!) 96.4 F (35.8 C)   Resp 18   Ht 5' 10"  (1.778 m)   Wt 203 lb (92.1 kg)   BMI 29.13 kg/m   BP/Weight 03/14/2021 03/07/2021 4/82/7078  Systolic BP 675 449 201  Diastolic BP 60 74 76  Wt. (Lbs) 203 203 204.2  BMI 29.13 29.13 29.3    Physical Exam Vitals reviewed.  Constitutional:      Appearance: Normal appearance.  Cardiovascular:     Rate and Rhythm: Normal rate.     Heart sounds: Normal heart sounds.  Pulmonary:     Effort: Pulmonary effort is normal.     Breath sounds: Normal breath sounds.  Musculoskeletal:     Right lower leg: Edema (trace) present.     Left lower leg: Edema (1 +. Dactylitia of BL toes.) present.  Skin:    Findings: Erythema (up to mid foot more medially. blanches. nontender. left proximal medial stage 2 ulceration.) present.  Neurological:     Mental Status: He is alert.    Diabetic Foot Exam - Simple   No data filed      Lab Results  Component Value Date   WBC 5.6 11/12/2020   HGB 10.5 (L) 02/02/2021   HCT 31.0 (L) 02/02/2021   PLT 172 11/12/2020   GLUCOSE 140 (H)  02/02/2021   CHOL 159 11/12/2020   TRIG 62 11/12/2020   HDL 69 11/12/2020   LDLCALC 78 11/12/2020   ALT 12 11/12/2020   AST 14 11/12/2020   NA 143 02/02/2021   K 3.6 02/02/2021  CL 105 02/02/2021   CREATININE 1.60 (H) 02/02/2021   BUN 31 (H) 02/02/2021   CO2 23 11/12/2020   TSH 0.713 08/05/2014   INR 3.22 (H) 08/04/2014   HGBA1C 7.0 (H) 10/05/2020   MICROALBUR 30 05/17/2020      Assessment & Plan:   Problem List Items Addressed This Visit       Other   Cellulitis of left lower leg - Primary    Unna boot reapplied.  Continue cipro.        Edema of left lower leg    Unna boot.      .  No orders of the defined types were placed in this encounter.   No orders of the defined types were placed in this encounter.    Follow-up: Return in about 1 week (around 03/21/2021) for chronic fasting.  An After Visit Summary was printed and given to the patient.  Rochel Brome, MD [facility] [phone]

## 2021-03-21 ENCOUNTER — Encounter: Payer: Self-pay | Admitting: Family Medicine

## 2021-03-21 ENCOUNTER — Other Ambulatory Visit: Payer: Self-pay

## 2021-03-21 ENCOUNTER — Ambulatory Visit: Payer: Medicare Other | Admitting: Family Medicine

## 2021-03-21 VITALS — BP 138/62 | HR 78 | Temp 97.8°F | Ht 70.0 in | Wt 201.0 lb

## 2021-03-21 DIAGNOSIS — E782 Mixed hyperlipidemia: Secondary | ICD-10-CM

## 2021-03-21 DIAGNOSIS — Z23 Encounter for immunization: Secondary | ICD-10-CM

## 2021-03-21 DIAGNOSIS — I10 Essential (primary) hypertension: Secondary | ICD-10-CM

## 2021-03-21 DIAGNOSIS — N184 Chronic kidney disease, stage 4 (severe): Secondary | ICD-10-CM

## 2021-03-21 DIAGNOSIS — N400 Enlarged prostate without lower urinary tract symptoms: Secondary | ICD-10-CM

## 2021-03-21 DIAGNOSIS — E1121 Type 2 diabetes mellitus with diabetic nephropathy: Secondary | ICD-10-CM

## 2021-03-21 DIAGNOSIS — E1151 Type 2 diabetes mellitus with diabetic peripheral angiopathy without gangrene: Secondary | ICD-10-CM

## 2021-03-21 DIAGNOSIS — R7989 Other specified abnormal findings of blood chemistry: Secondary | ICD-10-CM

## 2021-03-21 DIAGNOSIS — R600 Localized edema: Secondary | ICD-10-CM | POA: Diagnosis not present

## 2021-03-21 DIAGNOSIS — L03116 Cellulitis of left lower limb: Secondary | ICD-10-CM

## 2021-03-21 DIAGNOSIS — I4819 Other persistent atrial fibrillation: Secondary | ICD-10-CM

## 2021-03-21 LAB — POCT UA - MICROALBUMIN: Microalbumin Ur, POC: 30 mg/L

## 2021-03-21 NOTE — Progress Notes (Signed)
Subjective:  Patient ID: Glen Lee, male    DOB: [birth year]  Age: 87 y.o. MRN: [id number]  Chief Complaint  Patient presents with   Cellulitis of leg 1 week follow up    HPI Pt Is a 87 year old white male with type 2 diabetes, atrial fibrillation, peripheral artery disease, who presents for follow-up of cellulitis of his left foot as well as a skin stage II ulceration of the left upper distal limb.  Patient was given Cipro.  Patient had Unna boot x1 week.  I saw the patient with Lemmie Evens, CMA Dr. Blanch Media nurse.  Swelling has improved.  Redness in his foot is approximately the same.  No drainage.  Diabetes:  Complications: ckd Glucose checking: none Most recent A1C: 7 Current medications: Tradjenta 5 mg once daily. Foot checks: daily when not wrapped in unna boots.   Hyperlipidemia: Current medications: zocor 20 mg once daily.   Hypertension: Current medications: Diltiazem (cardizem cd) 120 mg once daily.   Atrial fibrillation: on diltiazem.   Pedal edema: on lasix 20 mg one bid.   Current Outpatient Medications on File Prior to Visit  Medication Sig Dispense Refill   diclofenac Sodium (VOLTAREN) 1 % GEL SMARTSIG:2 Gram(s) Topical 1 to 4 Times Daily     diltiazem (CARDIZEM CD) 120 MG 24 hr capsule TAKE 1 CAPSULE BY MOUTH EVERY DAY 90 capsule 2   erythromycin ophthalmic ointment SMARTSIG:1 Sparingly In Eye(s) Twice Daily     ferrous sulfate 325 (65 FE) MG tablet Take 325 mg by mouth daily with breakfast.     finasteride (PROSCAR) 5 MG tablet TAKE 1 TABLET BY MOUTH EVERY DAY 90 tablet 2   ketoconazole (NIZORAL) 2 % shampoo USE AS DIRECTED, ROTATE BETWEEN OVER THE COUNTER SHAMPOOS     Mesalamine 800 MG TBEC TAKE 1 TABLET BY MOUTH TWICE A DAY 180 tablet 2   pantoprazole (PROTONIX) 20 MG tablet TAKE 1 TABLET BY MOUTH EVERY DAY AS DIRECTED 90 tablet 3   simvastatin (ZOCOR) 20 MG tablet TAKE 1 TABLET BY MOUTH EVERY DAY 90 tablet 2   tamsulosin (FLOMAX) 0.4 MG CAPS capsule TAKE 1  CAPSULE BY MOUTH EVERY DAY 90 capsule 2   tolnaftate (TINACTIN) 1 % powder Apply 1 application topically 2 (two) times daily. 45 g 3   TRADJENTA 5 MG TABS tablet TAKE 1 TABLET BY MOUTH EVERY DAY 90 tablet 3   traZODone (DESYREL) 100 MG tablet TAKE 1 TABLET BY MOUTH AT NIGHT 90 tablet 2   triamcinolone cream (KENALOG) 0.1 % APPLY TO AFFECTED AREA TWICE A DAY 454 g 3   No current facility-administered medications on file prior to visit.   Past Medical History:  Diagnosis Date   Anemia    Arthritis    Benign prostate hyperplasia    Diabetes mellitus without complication (HCC)    Edema    right leg   Enlarged prostate    Esophageal reflux    Essential hypertension    HOH (hard of hearing)    wears hearing aid   Hyperlipidemia    Hypertension    Mixed hyperlipidemia    Tremor    right hand   Type 2 diabetes mellitus with other specified complication (HCC)    Vitamin D deficiency    Past Surgical History:  Procedure Laterality Date   JOINT REPLACEMENT Left    knee   KNEE SURGERY     ORIF ANKLE FRACTURE Right 06/30/2016   Procedure: OPEN REDUCTION INTERNAL FIXATION (ORIF) ANKLE  FRACTURE;  Surgeon: Altamese [place], MD;  Location: [place];  Service: Orthopedics;  Laterality: Right;    Family History  Problem Relation Age of Onset   Hyperlipidemia Mother    Diabetes Mother    Colon cancer Father    Diabetes Father    Social History   Socioeconomic History   Marital status: Single    Spouse name: Not on file   Number of children: 3   Years of education: Not on file   Highest education level: Not on file  Occupational History   Not on file  Tobacco Use   Smoking status: Former    Years: 20.00    Types: Cigarettes    Quit date: 08/04/1992    Years since quitting: 28.6   Smokeless tobacco: Never  Vaping Use   Vaping Use: Never used  Substance and Sexual Activity   Alcohol use: Not Currently    Alcohol/week: 0.0 standard drinks   Drug use: Never   Sexual activity: Not  Currently  Other Topics Concern   Not on file  Social History Narrative   Concern for pt ability to care for himself. Pt refused placement post hospitalization after rehab   Social Determinants of Health   Financial Resource Strain: Not on file  Food Insecurity: Not on file  Transportation Needs: Not on file  Physical Activity: Not on file  Stress: Not on file  Social Connections: Not on file    Review of Systems  Constitutional:  Negative for chills, fatigue and fever.  HENT:  Negative for congestion, ear pain and sore throat.   Respiratory:  Negative for cough and shortness of breath.   Cardiovascular:  Positive for leg swelling. Negative for chest pain.  Gastrointestinal:  Negative for abdominal pain, constipation, diarrhea, nausea and vomiting.  Genitourinary:  Negative for dysuria and frequency.  Musculoskeletal:  Negative for arthralgias and myalgias.  Neurological:  Negative for dizziness and headaches.  Psychiatric/Behavioral:  Negative for dysphoric mood. The patient is not nervous/anxious.     Objective:  BP 138/62 (BP Location: Right Arm, Patient Position: Sitting)   Pulse 78   Temp 97.8 F (36.6 C) (Temporal)   Ht 5' 10"  (1.778 m)   Wt 201 lb (91.2 kg)   SpO2 97%   BMI 28.84 kg/m   BP/Weight 03/21/2021 03/14/2021 4/40/1027  Systolic BP 253 664 403  Diastolic BP 62 60 74  Wt. (Lbs) 201 203 203  BMI 28.84 29.13 29.13    Physical Exam Vitals reviewed.  Constitutional:      Appearance: Normal appearance.  Neck:     Vascular: No carotid bruit.  Cardiovascular:     Rate and Rhythm: Normal rate. Rhythm irregular.     Heart sounds: Normal heart sounds.  Pulmonary:     Effort: Pulmonary effort is normal.     Breath sounds: Normal breath sounds.  Abdominal:     General: Abdomen is flat. Bowel sounds are normal.     Palpations: Abdomen is soft.     Tenderness: There is no abdominal tenderness.  Musculoskeletal:        General: Normal range of motion.   Skin:    Findings: No rash (none on back, but feels like it itches.).     Comments: Left leg unwrapped.  think cellulitis has resolved and his redness is dependent rubor.   Neurological:     Mental Status: He is alert and oriented to person, place, and time.  Psychiatric:  Mood and Affect: Mood normal.        Behavior: Behavior normal.    Diabetic Foot Exam - Simple   No data filed      Lab Results  Component Value Date   WBC 6.3 03/21/2021   HGB 9.6 (L) 03/21/2021   HCT 28.1 (L) 03/21/2021   PLT 179 03/21/2021   GLUCOSE 124 (H) 03/21/2021   CHOL 120 03/21/2021   TRIG 92 03/21/2021   HDL 55 03/21/2021   LDLCALC 47 03/21/2021   ALT 7 03/21/2021   AST 18 03/21/2021   NA 143 03/21/2021   K 4.1 03/21/2021   CL 103 03/21/2021   CREATININE 1.72 (H) 03/21/2021   BUN 31 (H) 03/21/2021   CO2 24 03/21/2021   TSH 4.900 (H) 03/21/2021   INR 3.22 (H) 08/04/2014   HGBA1C 7.3 (H) 03/21/2021   MICROALBUR 30 03/21/2021      Assessment & Plan:   Problem List Items Addressed This Visit       Cardiovascular and Mediastinum   Persistent atrial fibrillation (HCC)    Continue diltiazem.      Essential hypertension    The current medical regimen is effective;  continue present plan and medications. Continue Diltiazem (cardizem cd) 120 mg once daily.      Diabetes mellitus type 2 with peripheral artery disease (HCC)    Control: little worse. A1C RESULTED AT 7.3.  Recommend annual eye exams. Medicines: recommend farxiga 5 mg once daily.  Continue to work on eating a healthy diet       Relevant Orders   POCT UA - Microalbumin (Completed)   Hemoglobin A1c (Completed)     Endocrine   Diabetic glomerulopathy ([place])    Start on farxiga 5 mg once daily.         Genitourinary   CKD (chronic kidney disease) stage 4, GFR 15-29 ml/min (HCC)    Check CMP.  Worsened. Decrease lasix to back to 20 mg once daily.       Benign prostate hyperplasia     Other   Mixed  hyperlipidemia    The current medical regimen is effective;  continue present plan and medications. Continue zocor 20 mg once daily.       Relevant Orders   CBC with Differential/Platelet (Completed)   Comprehensive metabolic panel (Completed)   Lipid panel (Completed)   TSH (Completed)   Cellulitis of left lower leg    Improved. Some of erythema is dependent rubor.       Edema of left lower leg - Primary    Improved with unna boot and increased lasix. Decrease lasix back to 20 mg once daily and elevate legs.        Elevated TSH    Add free T4.       Other Visit Diagnoses     Need for immunization against influenza       Relevant Orders   Flu Vaccine QUAD High Dose(Fluad) (Completed)     .  No orders of the defined types were placed in this encounter.   Orders Placed This Encounter  Procedures   Flu Vaccine QUAD High Dose(Fluad)   CBC with Differential/Platelet   Comprehensive metabolic panel   Lipid panel   TSH   Hemoglobin A1c   Cardiovascular Risk Assessment   POCT UA - Microalbumin      Follow-up: Return in about 2 weeks (around 04/04/2021) for chronic follow up.  An After Visit Summary was printed and given to  the patient.     I,Lauren M Auman,acting as a scribe for Rochel Brome, MD.,have documented all relevant documentation on the behalf of Rochel Brome, MD,as directed by  Rochel Brome, MD while in the presence of Rochel Brome, MD.     Rochel Brome, MD [place] [phone]

## 2021-03-22 ENCOUNTER — Ambulatory Visit (INDEPENDENT_AMBULATORY_CARE_PROVIDER_SITE_OTHER): Payer: Medicare Other

## 2021-03-22 ENCOUNTER — Other Ambulatory Visit: Payer: Self-pay

## 2021-03-22 DIAGNOSIS — E1151 Type 2 diabetes mellitus with diabetic peripheral angiopathy without gangrene: Secondary | ICD-10-CM

## 2021-03-22 DIAGNOSIS — L03116 Cellulitis of left lower limb: Secondary | ICD-10-CM

## 2021-03-22 DIAGNOSIS — R7989 Other specified abnormal findings of blood chemistry: Secondary | ICD-10-CM | POA: Insufficient documentation

## 2021-03-22 DIAGNOSIS — L03115 Cellulitis of right lower limb: Secondary | ICD-10-CM

## 2021-03-22 DIAGNOSIS — R600 Localized edema: Secondary | ICD-10-CM

## 2021-03-22 LAB — COMPREHENSIVE METABOLIC PANEL
ALT: 7 IU/L (ref 0–44)
AST: 18 IU/L (ref 0–40)
Albumin/Globulin Ratio: 1.7 (ref 1.2–2.2)
Albumin: 4.1 g/dL (ref 3.6–4.6)
Alkaline Phosphatase: 126 IU/L — ABNORMAL HIGH (ref 44–121)
BUN/Creatinine Ratio: 18 (ref 10–24)
BUN: 31 mg/dL — ABNORMAL HIGH (ref 8–27)
Bilirubin Total: 0.7 mg/dL (ref 0.0–1.2)
CO2: 24 mmol/L (ref 20–29)
Calcium: 9.7 mg/dL (ref 8.6–10.2)
Chloride: 103 mmol/L (ref 96–106)
Creatinine, Ser: 1.72 mg/dL — ABNORMAL HIGH (ref 0.76–1.27)
Globulin, Total: 2.4 g/dL (ref 1.5–4.5)
Glucose: 124 mg/dL — ABNORMAL HIGH (ref 70–99)
Potassium: 4.1 mmol/L (ref 3.5–5.2)
Sodium: 143 mmol/L (ref 134–144)
Total Protein: 6.5 g/dL (ref 6.0–8.5)
eGFR: 39 mL/min/{1.73_m2} — ABNORMAL LOW (ref >59)

## 2021-03-22 LAB — CBC WITH DIFFERENTIAL/PLATELET
Basophils Absolute: 0.1 10*3/uL (ref 0.0–0.2)
Basos: 1 %
EOS (ABSOLUTE): 0.3 10*3/uL (ref 0.0–0.4)
Eos: 5 %
Hematocrit: 28.1 % — ABNORMAL LOW (ref 37.5–51.0)
Hemoglobin: 9.6 g/dL — ABNORMAL LOW (ref 13.0–17.7)
Immature Grans (Abs): 0 10*3/uL (ref 0.0–0.1)
Immature Granulocytes: 1 %
Lymphocytes Absolute: 1.1 10*3/uL (ref 0.7–3.1)
Lymphs: 17 %
MCH: 29.3 pg (ref 26.6–33.0)
MCHC: 34.2 g/dL (ref 31.5–35.7)
MCV: 86 fL (ref 79–97)
Monocytes Absolute: 0.6 10*3/uL (ref 0.1–0.9)
Monocytes: 9 %
Neutrophils Absolute: 4.3 10*3/uL (ref 1.4–7.0)
Neutrophils: 67 %
Platelets: 179 10*3/uL (ref 150–450)
RBC: 3.28 x10E6/uL — ABNORMAL LOW (ref 4.14–5.80)
RDW: 14.2 % (ref 11.6–15.4)
WBC: 6.3 10*3/uL (ref 3.4–10.8)

## 2021-03-22 LAB — LIPID PANEL
Chol/HDL Ratio: 2.2 ratio (ref 0.0–5.0)
Cholesterol, Total: 120 mg/dL (ref 100–199)
HDL: 55 mg/dL (ref >39)
LDL Chol Calc (NIH): 47 mg/dL (ref 0–99)
Triglycerides: 92 mg/dL (ref 0–149)
VLDL Cholesterol Cal: 18 mg/dL (ref 5–40)

## 2021-03-22 LAB — HEMOGLOBIN A1C
Est. average glucose Bld gHb Est-mCnc: 163 mg/dL
Hgb A1c MFr Bld: 7.3 % — ABNORMAL HIGH (ref 4.8–5.6)

## 2021-03-22 LAB — TSH: TSH: 4.9 u[IU]/mL — ABNORMAL HIGH (ref 0.450–4.500)

## 2021-03-22 MED ORDER — FUROSEMIDE 20 MG PO TABS: 20.0000 mg | ORAL_TABLET | Freq: Every day | ORAL | 0 refills | Status: AC

## 2021-03-22 MED ORDER — DAPAGLIFLOZIN PROPANEDIOL 5 MG PO TABS: 5.0000 mg | ORAL_TABLET | Freq: Every day | ORAL | 2 refills | Status: DC

## 2021-03-22 NOTE — Assessment & Plan Note (Signed)
Improved. Some of erythema is dependent rubor.

## 2021-03-22 NOTE — Assessment & Plan Note (Signed)
Check CMP.  Worsened. Decrease lasix to back to 20 mg once daily.

## 2021-03-22 NOTE — Assessment & Plan Note (Signed)
Improved with unna boot and increased lasix. Decrease lasix back to 20 mg once daily and elevate legs.

## 2021-03-22 NOTE — Assessment & Plan Note (Signed)
Continue diltiazem.

## 2021-03-22 NOTE — Assessment & Plan Note (Signed)
The current medical regimen is effective;  continue present plan and medications. Continue zocor 20 mg once daily.

## 2021-03-22 NOTE — Assessment & Plan Note (Signed)
Start on farxiga 5 mg once daily.

## 2021-03-22 NOTE — Progress Notes (Signed)
Blood count abnormal. Anemia is stable.  Liver function normal.  Kidney function abnormal and worsening. Recommend decrease lasix back to 20 mg once daily.  Thyroid function abnormal. Add Free T4. Cholesterol: at goal. HBA1C: 7.3. Worsened a little. Recommend start on farxiga 5 mg once daily to help kidney function. Will also help sugars.

## 2021-03-22 NOTE — Assessment & Plan Note (Signed)
Add free T4

## 2021-03-22 NOTE — Chronic Care Management (AMB) (Signed)
   03/22/2021  HRISHIKESH HOEG [birth year] [id number]  Placed call to patient with no answer. Left a message requesting a return call. Spoke with niece [name] and she reports patient had a follow up yesterday and leg is much improved.  Laurene will call me back on 03/23/2021 at 9am when she is with patient.  PLAN: will await call back tomorrow.  Tomasa Rand RN, BSN, CEN RN Case Freight forwarder - [employer] exhibitions officer Mobile: [phone]

## 2021-03-22 NOTE — Assessment & Plan Note (Signed)
The current medical regimen is effective;  continue present plan and medications. Continue Diltiazem (cardizem cd) 120 mg once daily.

## 2021-03-22 NOTE — Assessment & Plan Note (Signed)
Control: little worse. A1C RESULTED AT 7.3.  Recommend annual eye exams. Medicines: recommend farxiga 5 mg once daily.  Continue to work on eating a healthy diet

## 2021-03-23 ENCOUNTER — Telehealth: Payer: Self-pay | Admitting: Legal Medicine

## 2021-03-23 ENCOUNTER — Ambulatory Visit: Payer: Self-pay

## 2021-03-23 DIAGNOSIS — E1151 Type 2 diabetes mellitus with diabetic peripheral angiopathy without gangrene: Secondary | ICD-10-CM

## 2021-03-23 DIAGNOSIS — L03116 Cellulitis of left lower limb: Secondary | ICD-10-CM

## 2021-03-23 DIAGNOSIS — L03115 Cellulitis of right lower limb: Secondary | ICD-10-CM

## 2021-03-23 LAB — SPECIMEN STATUS REPORT

## 2021-03-23 LAB — T4, FREE: Free T4: 1.17 ng/dL (ref 0.82–1.77)

## 2021-03-23 NOTE — Patient Instructions (Signed)
Visit Information  PATIENT GOALS:  Goals Addressed             This Visit's Progress    Manage My Medicine       Timeframe:  Long-Range Goal Priority:  High Start Date:        10/19/2020                    Expected End Date:    01/19/2021                   Follow Up Date 04/21/2021  - Take my medications as prescribed. Including new medications - I will call my doctor for increase swelling or redness in my legs. - elevate legs as much as I can - wear protective footwear.           Monitor and Manage My Blood Sugar-Diabetes Type 2       Timeframe:  Long-Range Goal Priority:  Medium Start Date:        10/19/2020                     Expected End Date: 01/19/2021              Follow Up Date: 04/21/2021  - I will call the doctor for any changes in my condition. - I will tell my niece if I don't feel good.           The patient verbalized understanding of instructions, educational materials, and care plan provided today and declined offer to receive copy of patient instructions, educational materials, and care plan.   Telephone follow up appointment with care management team member scheduled for: 04/21/2021  Tomasa Rand RN, BSN, CEN RN Case Manager - [employer] exhibitions officer Mobile: [phone]

## 2021-03-23 NOTE — Chronic Care Management (AMB) (Signed)
Chronic Care Management   CCM RN Visit Note  03/23/2021 Name: Glen Lee MRN: [id number] DOB: 06-09-1934  Subjective: Glen Lee is a 87 y.o. year old male who is a primary care patient of [name], MD. The care management team was consulted for assistance with disease management and care coordination needs.    Engaged with patient by telephone for follow up visit in response to provider referral for case management and/or care coordination services.   Consent to Services:  The patient was given information about Chronic Care Management services, agreed to services, and gave verbal consent prior to initiation of services.  Please see initial visit note for detailed documentation.   Patient agreed to services and verbal consent obtained.   Assessment: Review of patient past medical history, allergies, medications, health status, including review of consultants reports, laboratory and other test data, was performed as part of comprehensive evaluation and provision of chronic care management services.   SDOH (Social Determinants of Health) assessments and interventions performed:    CCM Care Plan  Allergies  Allergen Reactions   Clindamycin/Lincomycin Diarrhea    bloody   Doxycycline    Sulfa Antibiotics Other (See Comments)   Torsemide    Keflet [Cephalexin] Rash    Outpatient Encounter Medications as of 03/23/2021  Medication Sig   dapagliflozin propanediol (FARXIGA) 5 MG TABS tablet Take 1 tablet (5 mg total) by mouth daily.   diclofenac Sodium (VOLTAREN) 1 % GEL SMARTSIG:2 Gram(s) Topical 1 to 4 Times Daily   diltiazem (CARDIZEM CD) 120 MG 24 hr capsule TAKE 1 CAPSULE BY MOUTH EVERY DAY   erythromycin ophthalmic ointment SMARTSIG:1 Sparingly In Eye(s) Twice Daily   ferrous sulfate 325 (65 FE) MG tablet Take 325 mg by mouth daily with breakfast.   finasteride (PROSCAR) 5 MG tablet TAKE 1 TABLET BY MOUTH EVERY DAY   furosemide (LASIX) 20 MG tablet Take 1  tablet (20 mg total) by mouth daily.   ketoconazole (NIZORAL) 2 % shampoo USE AS DIRECTED, ROTATE BETWEEN OVER THE COUNTER SHAMPOOS   Mesalamine 800 MG TBEC TAKE 1 TABLET BY MOUTH TWICE A DAY   pantoprazole (PROTONIX) 20 MG tablet TAKE 1 TABLET BY MOUTH EVERY DAY AS DIRECTED   simvastatin (ZOCOR) 20 MG tablet TAKE 1 TABLET BY MOUTH EVERY DAY   tamsulosin (FLOMAX) 0.4 MG CAPS capsule TAKE 1 CAPSULE BY MOUTH EVERY DAY   tolnaftate (TINACTIN) 1 % powder Apply 1 application topically 2 (two) times daily.   TRADJENTA 5 MG TABS tablet TAKE 1 TABLET BY MOUTH EVERY DAY   traZODone (DESYREL) 100 MG tablet TAKE 1 TABLET BY MOUTH AT NIGHT   triamcinolone cream (KENALOG) 0.1 % APPLY TO AFFECTED AREA TWICE A DAY   No facility-administered encounter medications on file as of 03/23/2021.    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   Mixed hyperlipidemia    Essential hypertension    Diabetes mellitus type 2 with peripheral artery disease (HCC)    Benign prostate hyperplasia  Hammer toes of both feet 03/21/2017   Right ankle joint deformity 03/21/2017   Varicose veins of lower extremities with complications 09/98/3382   Chronic venous insufficiency 05/14/2015   Persistent atrial fibrillation (HCC)    CKD (chronic kidney disease) stage 4, GFR 15-29 ml/min ([place]) 08/04/2014    Conditions to be addressed/monitored:DMII and cellulitis  Care Plan : Cellulitis- ineffective management of medication to help reduce fluid overload in lower extremities  Updates made by  Thana Ates, RN since 03/23/2021 12:00 AM     Problem: Medication Adherence ([facility])      Long-Range Goal: Medication Adherence Maintained   Start Date: 10/19/2020  Expected End Date: 01/18/2021  Recent Progress: On track  Priority: High  Note:    Current Barriers:  Ineffective Self Health Maintenance- Patient reports he is taking all medications as prescribed. Including the lasix.  Unable to self administer medications as prescribed: currently reports taking medications as prescribed. Reports using a pill box which he fills himself.  Pending surgical intervention.  Surgery did not occur due to infection. Seen at MD office this week and cellulitis is healed. Clinical Goal(s):  Collaboration with [name], MD regarding development and update of comprehensive plan of care as evidenced by provider attestation and co-signature Inter-disciplinary care team collaboration (see longitudinal plan of care) patient will work with care management team to address care coordination and chronic disease management needs related to Medication Management and Education   Interventions:  Evaluation of current treatment plan related to DMII, fluid overload and self-management and patient's adherence to plan as established by provider. Collaboration with [name], MD regarding development and update of comprehensive plan of care as evidenced by provider attestation       and co-signature Inter-disciplinary care team collaboration (see longitudinal plan of care) Discussed plans with patient for ongoing care management follow up and provided patient with direct contact information for care management team Discussed and reviewed with patient the reason for taking Lasix and the importance of not skipping or self reducing medications. Reviewed progress of contnued swelling at this time and that his toes are healing. Again reviewed the signs and symptoms of infection and encouraged  patient to call MD office at the onset of increased swelling, pain, redness or drainage.  Encouraged patient to wear shoes as per MD. Reviewed importance of not bumping toes.  Reviewed importance of keeping legs clean and dry. Reviewed importance of elevation of legs. Reviewed signs and symtoms of infection and when to call the MD.  Self Care Activities:  Patient verbalizes understanding of plan to take Lasix as prescribed Self administers medications as prescribed using the aid of a pill box.  Attends all scheduled provider appointments Calls provider office for new concerns or questions Patient Goals: - Take my medications as prescribed.  - I will call my doctor for increase swelling or redness in my legs. - elevate legs as much as I can - wear protective footwear.  Follow Up Plan: Telephone follow up appointment with care management team member scheduled for: 04/21/2021    Care Plan : Diabetes Type 2 - Patient will self monitor weekly as per MD recommendation  Updates made by Thana Ates, RN since 03/23/2021 12:00 AM     Problem: Disease Progression (Diabetes, Type 2)   Priority: Medium  Onset Date: 10/19/2020  Note:   Objective:  Lab Results  Component Value Date   HGBA1C 7.3 (H) 03/21/2021   Lab Results  Component Value  Date   CREATININE 1.72 (H) 03/21/2021   CREATININE 1.60 (H) 02/02/2021   CREATININE 1.66 (H) 11/12/2020   Lab Results  Component Value Date   EGFR 39 (L) 03/21/2021   Current Barriers:  Does not have glucometer to monitor blood sugar- Reviewed with patient again if he would self monitor. He reports yes if his supplies are free.  Does not adhere to provider recommendations re:  MD recommends patient self monitor weekly and record  readings.  Does not adhere to prescribed medication regimen: Patient reports taking all medications as prescribed. Reports he will start new medications when filled by pharmacy Case Manager Clinical Goal(s):  patient  will demonstrate improved adherence to prescribed treatment plan for diabetes self care/management as evidenced by: adherence to prescribed medication regimen, contacting provider for new or worsened symptoms or questions. Patient will self monitor weekly and record on calendar.  Interventions:  Collaboration with [name], MD regarding development and update of comprehensive plan of care as evidenced by provider attestation and co-signature Inter-disciplinary care team collaboration (see longitudinal plan of care) Reviewed medications with patient and discussed importance of medication adherence Discussed plans with patient for ongoing care management follow up and provided patient with direct contact information for care management team Reviewed scheduled/upcoming provider appointments including: this case manager follow up on 04/21/2021 Advised patient, providing education and rationale, to check cbg weekly and record, calling MD office for findings outside established parameters.   Review of patient status, including review of consultants reports, relevant laboratory and other test results, and medications completed. Reviewed Hgb A1c results and goals.  Reviewed with patient and niece the importance of self monitoring. Will make a referral to pharmacy to help with testing supplies Reviewed with patient and niece the increase in A1C Self-Care Activities - Self administers oral medications as prescribed Attends all scheduled provider appointments Patient Goals: - check blood sugar at prescribed times - take the blood sugar log to all doctor visits - take the blood sugar meter to all doctor visits  Follow Up Plan: Telephone follow up appointment with care management team member scheduled for: 04/21/2021     Plan:Telephone follow up appointment with care management team member scheduled for:  04/21/2021 Tomasa Rand RN, BSN, CEN RN Case Manager - [facility] nurse Mobile: [phone]

## 2021-03-23 NOTE — Progress Notes (Signed)
  Chronic Care Management   Note  03/23/2021 Name: Glen Lee MRN: [id number] DOB: Jun 28, 1933  Glen Lee is a 87 y.o. year old male who is a primary care patient of [name], MD. I reached out to Loraine Grip by phone today in response to a referral sent by Glen Lee's PCP, [name], MD.   Glen Lee was given information about Chronic Care Management services today including:  CCM service includes personalized support from designated clinical staff supervised by his physician, including individualized plan of care and coordination with other care providers 24/7 contact phone numbers for assistance for urgent and routine care needs. Service will only be billed when office clinical staff spend 20 minutes or more in a month to coordinate care. Only one practitioner may furnish and bill the service in a calendar month. The patient may stop CCM services at any time (effective at the end of the month) by phone call to the office staff.   Glen Lee verbally agreed to assistance and services provided by embedded care coordination/care management team today.  Follow up plan: $5 COPAY AND DEDUCTIBLE DISCUSSED   Tatjana Dellinger Upstream Scheduler

## 2021-03-24 ENCOUNTER — Telehealth: Payer: Self-pay

## 2021-03-24 ENCOUNTER — Ambulatory Visit: Payer: Medicare Other | Admitting: Podiatry

## 2021-03-24 NOTE — Telephone Encounter (Signed)
Glen Lee is having concerns about side effects of the [place].  He has not started the medication but has read that he could experience dizziness and light-headedness.  I tried to answer all questions and encourage him to try the medication as prescribed.

## 2021-03-28 ENCOUNTER — Encounter: Payer: Self-pay | Admitting: Family Medicine

## 2021-03-28 ENCOUNTER — Ambulatory Visit: Payer: Medicare Other | Admitting: Family Medicine

## 2021-03-28 ENCOUNTER — Other Ambulatory Visit: Payer: Self-pay

## 2021-03-28 VITALS — BP 124/50 | HR 84 | Temp 97.0°F | Resp 18 | Wt 202.0 lb

## 2021-03-28 DIAGNOSIS — L539 Erythematous condition, unspecified: Secondary | ICD-10-CM | POA: Diagnosis not present

## 2021-03-28 DIAGNOSIS — R600 Localized edema: Secondary | ICD-10-CM | POA: Diagnosis not present

## 2021-03-28 DIAGNOSIS — M25572 Pain in left ankle and joints of left foot: Secondary | ICD-10-CM

## 2021-03-28 DIAGNOSIS — L03116 Cellulitis of left lower limb: Secondary | ICD-10-CM

## 2021-03-28 MED ORDER — CIPROFLOXACIN HCL 250 MG PO TABS: 250.0000 mg | ORAL_TABLET | Freq: Two times a day (BID) | ORAL | 0 refills | Status: AC

## 2021-03-28 NOTE — Assessment & Plan Note (Signed)
Rule out gout. Possibly pseudogout also.

## 2021-03-28 NOTE — Progress Notes (Signed)
Acute Office Visit  Subjective:    Patient ID: Glen Lee, male    DOB: 08/27/1933, 87 y.o.   MRN: [id number]  Chief Complaint  Patient presents with   Recurrent Skin Infections     HPI: Patient is in today for cellulitis on left leg. Worsened again. I decreased lasix back to once a day due to increasing creatinine. His left foot mainly in his toes hurts. Pt has had 3 courses of cipro over the last 2 months. He is supposed to be having foot surgery, but podiatry will not do it until cellulitis resolves. Also has had unna boots, but is not a fan. They help with swelling, but not erythema. Wt is essentially unchanged .  Diabetes: checking sugars 140-150. On Farxiga 5 mg daily.  Past Medical History:  Diagnosis Date   Anemia    Arthritis    Benign prostate hyperplasia    Diabetes mellitus without complication (HCC)    Edema    right leg   Enlarged prostate    Esophageal reflux    Essential hypertension    HOH (hard of hearing)    wears hearing aid   Hyperlipidemia    Hypertension    Mixed hyperlipidemia    Tremor    right hand   Type 2 diabetes mellitus with other specified complication (HCC)    Vitamin D deficiency     Past Surgical History:  Procedure Laterality Date   JOINT REPLACEMENT Left    knee   KNEE SURGERY     ORIF ANKLE FRACTURE Right 06/30/2016   Procedure: OPEN REDUCTION INTERNAL FIXATION (ORIF) ANKLE FRACTURE;  Surgeon: Altamese Whispering [place], MD;  Location: [facility];  Service: Orthopedics;  Laterality: Right;    Family History  Problem Relation Age of Onset   Hyperlipidemia Mother    Diabetes Mother    Colon cancer Father    Diabetes Father     Social History   Socioeconomic History   Marital status: Single    Spouse name: Not on file   Number of children: 3   Years of education: Not on file   Highest education level: Not on file  Occupational History   Not on file  Tobacco Use   Smoking status: Former    Years: 20.00    Types: Cigarettes     Quit date: 08/04/1992    Years since quitting: 28.6   Smokeless tobacco: Never  Vaping Use   Vaping Use: Never used  Substance and Sexual Activity   Alcohol use: Not Currently    Alcohol/week: 0.0 standard drinks   Drug use: Never   Sexual activity: Not Currently  Other Topics Concern   Not on file  Social History Narrative   Concern for pt ability to care for himself. Pt refused placement post hospitalization after rehab   Social Determinants of Health   Financial Resource Strain: Not on file  Food Insecurity: Not on file  Transportation Needs: Not on file  Physical Activity: Not on file  Stress: Not on file  Social Connections: Not on file  Intimate Partner Violence: Not on file    Outpatient Medications Prior to Visit  Medication Sig Dispense Refill   dapagliflozin propanediol (FARXIGA) 5 MG TABS tablet Take 1 tablet (5 mg total) by mouth daily. 30 tablet 2   diclofenac Sodium (VOLTAREN) 1 % GEL SMARTSIG:2 Gram(s) Topical 1 to 4 Times Daily     diltiazem (CARDIZEM CD) 120 MG 24 hr capsule TAKE 1 CAPSULE  BY MOUTH EVERY DAY 90 capsule 2   erythromycin ophthalmic ointment SMARTSIG:1 Sparingly In Eye(s) Twice Daily     ferrous sulfate 325 (65 FE) MG tablet Take 325 mg by mouth daily with breakfast.     finasteride (PROSCAR) 5 MG tablet TAKE 1 TABLET BY MOUTH EVERY DAY 90 tablet 2   furosemide (LASIX) 20 MG tablet Take 1 tablet (20 mg total) by mouth daily. 1 tablet 0   ketoconazole (NIZORAL) 2 % shampoo USE AS DIRECTED, ROTATE BETWEEN OVER THE COUNTER SHAMPOOS     Mesalamine 800 MG TBEC TAKE 1 TABLET BY MOUTH TWICE A DAY 180 tablet 2   pantoprazole (PROTONIX) 20 MG tablet TAKE 1 TABLET BY MOUTH EVERY DAY AS DIRECTED 90 tablet 3   simvastatin (ZOCOR) 20 MG tablet TAKE 1 TABLET BY MOUTH EVERY DAY 90 tablet 2   tamsulosin (FLOMAX) 0.4 MG CAPS capsule TAKE 1 CAPSULE BY MOUTH EVERY DAY 90 capsule 2   tolnaftate (TINACTIN) 1 % powder Apply 1 application topically 2 (two) times  daily. 45 g 3   TRADJENTA 5 MG TABS tablet TAKE 1 TABLET BY MOUTH EVERY DAY 90 tablet 3   traZODone (DESYREL) 100 MG tablet TAKE 1 TABLET BY MOUTH AT NIGHT 90 tablet 2   triamcinolone cream (KENALOG) 0.1 % APPLY TO AFFECTED AREA TWICE A DAY 454 g 3   No facility-administered medications prior to visit.    Allergies  Allergen Reactions   Clindamycin/Lincomycin Diarrhea    bloody   Doxycycline    Sulfa Antibiotics Other (See Comments)   Torsemide    Keflet [Cephalexin] Rash    Loses control of legs.     Review of Systems  Constitutional:  Negative for chills and fever.  HENT:  Negative for congestion.   Respiratory:  Negative for cough and shortness of breath.   Cardiovascular:  Negative for chest pain and palpitations.  Skin:  Positive for wound.       Weeping of toes left foot (great toe and 2nd toe)       Objective:    Physical Exam Vitals reviewed.  Constitutional:      Appearance: Normal appearance.  Musculoskeletal:        General: Tenderness (left toes (sausage like), rt toes similarly shaped but not erythematous.) present.     Right lower leg: Edema present.     Left lower leg: Edema (left > right. No weeping.) present.  Skin:    Findings: Erythema (left foot improves with elevation, but does not fully resolved.) present.    BP (!) 124/50   Pulse 84   Temp (!) 97 F (36.1 C)   Resp 18   Wt 202 lb (91.6 kg)   BMI 28.98 kg/m  Wt Readings from Last 3 Encounters:  03/28/21 202 lb (91.6 kg)  03/21/21 201 lb (91.2 kg)  03/14/21 203 lb (92.1 kg)    Health Maintenance Due  Topic Date Due   COVID-19 Vaccine (1) Never done   OPHTHALMOLOGY EXAM  Never done   Zoster Vaccines- Shingrix (1 of 2) Never done    There are no preventive care reminders to display for this patient.   Lab Results  Component Value Date   TSH 4.900 (H) 03/21/2021   Lab Results  Component Value Date   WBC 6.3 03/21/2021   HGB 9.6 (L) 03/21/2021   HCT 28.1 (L) 03/21/2021   MCV  86 03/21/2021   PLT 179 03/21/2021   Lab Results  Component Value  Date   NA 143 03/21/2021   K 4.1 03/21/2021   CO2 24 03/21/2021   GLUCOSE 124 (H) 03/21/2021   BUN 31 (H) 03/21/2021   CREATININE 1.72 (H) 03/21/2021   BILITOT 0.7 03/21/2021   ALKPHOS 126 (H) 03/21/2021   AST 18 03/21/2021   ALT 7 03/21/2021   PROT 6.5 03/21/2021   ALBUMIN 4.1 03/21/2021   CALCIUM 9.7 03/21/2021   ANIONGAP 8 07/03/2016   EGFR 39 (L) 03/21/2021   Lab Results  Component Value Date   CHOL 120 03/21/2021   Lab Results  Component Value Date   HDL 55 03/21/2021   Lab Results  Component Value Date   LDLCALC 47 03/21/2021   Lab Results  Component Value Date   TRIG 92 03/21/2021   Lab Results  Component Value Date   CHOLHDL 2.2 03/21/2021   Lab Results  Component Value Date   HGBA1C 7.3 (H) 03/21/2021       Assessment & Plan:   Problem List Items Addressed This Visit       Other   Cellulitis of left lower leg - Primary    Recurrent.  Rx for cipro given.      Relevant Medications   ciprofloxacin (CIPRO) 250 MG tablet   Edema of left lower leg    No change to lasix at this time.  Elevate left leg.       Relevant Orders   CBC with Differential/Platelet   Comprehensive metabolic panel   Arthralgia of toe of left foot    Rule out gout. Possibly pseudogout also.       Relevant Orders   Uric acid   Dependent rubor    Elevation helps.       Meds ordered this encounter  Medications   ciprofloxacin (CIPRO) 250 MG tablet    Sig: Take 1 tablet (250 mg total) by mouth 2 (two) times daily for 7 days.    Dispense:  14 tablet    Refill:  0    Orders Placed This Encounter  Procedures   Uric acid   CBC with Differential/Platelet   Comprehensive metabolic panel     Follow-up: Return in about 10 days (around 04/07/2021).  An After Visit Summary was printed and given to the patient.  Rochel Brome, MD [facility] [phone]

## 2021-03-28 NOTE — Assessment & Plan Note (Signed)
No change to lasix at this time.  Elevate left leg.

## 2021-03-28 NOTE — Assessment & Plan Note (Signed)
Elevation helps.

## 2021-03-28 NOTE — Assessment & Plan Note (Signed)
Recurrent.  Rx for cipro given.

## 2021-03-29 LAB — URIC ACID: Uric Acid: 8.5 mg/dL — ABNORMAL HIGH (ref 3.8–8.4)

## 2021-03-29 LAB — CBC WITH DIFFERENTIAL/PLATELET
Basophils Absolute: 0.1 10*3/uL (ref 0.0–0.2)
Basos: 1 %
EOS (ABSOLUTE): 0.3 10*3/uL (ref 0.0–0.4)
Eos: 6 %
Hematocrit: 29.9 % — ABNORMAL LOW (ref 37.5–51.0)
Hemoglobin: 9.5 g/dL — ABNORMAL LOW (ref 13.0–17.7)
Immature Grans (Abs): 0 10*3/uL (ref 0.0–0.1)
Immature Granulocytes: 0 %
Lymphocytes Absolute: 1 10*3/uL (ref 0.7–3.1)
Lymphs: 18 %
MCH: 28.1 pg (ref 26.6–33.0)
MCHC: 31.8 g/dL (ref 31.5–35.7)
MCV: 89 fL (ref 79–97)
Monocytes Absolute: 0.5 10*3/uL (ref 0.1–0.9)
Monocytes: 8 %
Neutrophils Absolute: 3.9 10*3/uL (ref 1.4–7.0)
Neutrophils: 67 %
Platelets: 183 10*3/uL (ref 150–450)
RBC: 3.38 x10E6/uL — ABNORMAL LOW (ref 4.14–5.80)
RDW: 14.1 % (ref 11.6–15.4)
WBC: 5.8 10*3/uL (ref 3.4–10.8)

## 2021-03-29 LAB — COMPREHENSIVE METABOLIC PANEL
ALT: 10 IU/L (ref 0–44)
AST: 13 IU/L (ref 0–40)
Albumin/Globulin Ratio: 1.9 (ref 1.2–2.2)
Albumin: 4.1 g/dL (ref 3.6–4.6)
Alkaline Phosphatase: 118 IU/L (ref 44–121)
BUN/Creatinine Ratio: 19 (ref 10–24)
BUN: 31 mg/dL — ABNORMAL HIGH (ref 8–27)
Bilirubin Total: 0.5 mg/dL (ref 0.0–1.2)
CO2: 22 mmol/L (ref 20–29)
Calcium: 9.4 mg/dL (ref 8.6–10.2)
Chloride: 106 mmol/L (ref 96–106)
Creatinine, Ser: 1.67 mg/dL — ABNORMAL HIGH (ref 0.76–1.27)
Globulin, Total: 2.2 g/dL (ref 1.5–4.5)
Glucose: 153 mg/dL — ABNORMAL HIGH (ref 70–99)
Potassium: 4.2 mmol/L (ref 3.5–5.2)
Sodium: 142 mmol/L (ref 134–144)
Total Protein: 6.3 g/dL (ref 6.0–8.5)
eGFR: 40 mL/min/{1.73_m2} — ABNORMAL LOW (ref >59)

## 2021-03-30 ENCOUNTER — Telehealth: Payer: Self-pay

## 2021-03-30 NOTE — Chronic Care Management (AMB) (Signed)
Chronic Care Management Pharmacy Assistant   Name: Glen Lee  MRN: [id number] DOB: Dec 29, 1933  Reason for Encounter: Chart Review for CPP visit on 04/06/2021.   Conditions to be addressed/monitored: Atrial Fibrillation, HTN, HLD, DMII, and CKD Stage 4  Recent office visits:  03/28/2021- Rochel Brome, MD (PCP)- Patient presented for follow up visit on Cellulitis of left lower leg, Edema of left lower leg, Arthralgia of toe of left foot, Dependent rubor. Rule out gout. Possibly pseudogout also. Ciprofloxacin HCL 250 mg - 1 tablet  2 times daily for 7 days prescribed. Return in about 10 days (around 04/07/2021).  03/24/2021- Telephone Encounter- Patient has concerns about side effects of Farxiga 5 mg. No result of call.   03/23/2021- Tomasa Rand, RN (CCM)  03/22/2021- Telephone Encounter- Furosemide changed from 40 mg daily to 20 mg daily.  10/10/2022Rochel Brome, MD (PCP)- Patient presented for follow up visit on Edema of left lower leg, Cellulitis of left lower leg, Diabetes mellitus type 2 with peripheral artery disease ([employer]), Mixed hyperlipidemia, Persistent atrial fibrillation ([place]), CKD (chronic kidney disease) stage 4, GFR 15-29 ml/min ([place]), Essential hypertension, Benign prostatic hyperplasia without lower urinary tract symptoms, Need for immunization against influenza, Diabetic glomerulopathy ([place]), Elevated TSH.  No medication changes noted. Return in about 2 weeks (around 04/04/2021) for chronic follow up.  03/14/2021- Rochel Brome, MD (PCP)- Patient presented for follow up visit on Edema of left lower leg, Cellulitis of left lower leg. No Medication changes noted. Return in about 1 week (around 03/21/2021) for chronic fasting.  03/07/2021- Reinaldo Meeker, MD (PCP)- Patient presented for follow up visit on Cellulitis of left lower leg. Ciprofloxacin HCL 500 mg- 1 tablet 2 times daily for 10 days prescribed. Return in about 1 week (around 03/14/2021) for dr.  Tobie Poet 02/23/2021- Reinaldo Meeker, MD (PCP)- Patient presented for follow up visit on Cellulitis of left lower leg. No medication changes noted. Return in about 3 months (around 05/25/2021). 02/16/2021- Reinaldo Meeker, MD (PCP)- Patient presented for follow up visit on Cellulitis of left lower leg, Hammer toes of both feet, Edema of right lower leg, Cellulitis of right lower extremity, Chronic venous insufficiency.  No Medication changes noted. Return in about 1 week (around 02/23/2021) for edema legs.  02/11/2021- Reinaldo Meeker, MD (PCP)- Patient presented for follow up visit on Cellulitis of left lower leg. Levofloxacin 250 mg discontinued per patient preference. Ciprofloxacin HCL 500 mg- 1 tablet 2 times daily for 10 days prescribed. Return in about 4 days (around 02/15/2021) for infected toes  02/04/2021- Tomasa Rand, RN (CCM)  01/13/2021- Reinaldo Meeker, MD (PCP)- Patient presented for follow up visit on Persistent atrial fibrillation [facility]), Chronic venous insufficiency, Essential hypertension, Diabetes mellitus type 2 with peripheral artery disease ([place]), Diabetic glomerulopathy ([place]), CKD (chronic kidney disease) stage 4, GFR 15-29 ml/min ([place]), Mixed hyperlipidemia, Preoperative general physical examination.  No Medication changes noted. Return if symptoms worsen or fail to improve.   12/28/2020- Reinaldo Meeker, MD (PCP)- Patient presented for follow up visit on Varicose veins of both lower extremities with complications, Gangrene of toe ([place]).  No Medication changes noted. Return in about 2 weeks (around 01/11/2021).   12/14/2020- Jerrell Belfast, NP (PCP)- Patient presented for Chronic venous insufficiency, Pedal edema.  Unna boot applied in office. VAS [place] ABI w/wo TBI ordered. No Medication changes noted. Return in about 2 weeks (around 12/28/2020) for with Dr Henrene Pastor.  12/06/2020- Reinaldo Meeker, MD (PCP)- Patient presented for follow up visit on Varicose veins of  both lower extremities with  complications, Edema of right lower leg. No Medication changes noted. Return in about 1 week (around 12/13/2020).   11/23/2020- Tomasa Rand, RN (CCM)  11/12/2020- Reinaldo Meeker, MD (PCP)- Patient presented for follow up visit on Tinea pedis of left foot, Cellulitis of right lower extremity, Persistent atrial fibrillation ([place]), Essential hypertension, Diabetes mellitus type 2 with peripheral artery disease ([place]), Diabetic glomerulopathy ([place]), CKD (chronic kidney disease) stage 4, GFR 15-29 ml/min ([place]), Benign prostatic hyperplasia without lower urinary tract symptoms, Mixed hyperlipidemia. Tolnaftate 1% Powder- Apply 1 application topically 2 times daily prescribed. Return in about 3 months (around 02/12/2021) for fasting.  11/05/2020- Reinaldo Meeker, MD (PCP)- Patient presented for follow up visit on Cellulitis of left lower leg. No Medication changes noted. Return in about 1 week (around 11/12/2020) for edema legs  5/23/2022Rochel Brome, MD (PCP)- Patient presented for acute visit on Pedal edema, Cellulitis of left lower extremity, Cellulitis of right lower extremity.  Ciprofloxacin HCL 250 mg- 1 tablet 2 times daily for 5 days prescribed. Furosemide 20 mg directions changed from 1 and 1/2 tablet daily to 2 tablets daily (40 mg).  Return in about 5 days (around 11/06/2020) for Dr. Henrene Pastor.  10/19/2020- Reinaldo Meeker, MD (PCP)- Patient presented for follow up visit on Cellulitis of left lower extremity, Cellulitis of right lower extremity, Anemia, unspecified type. Clindamycin HCL 150 mg discontinued due to completed course. Return in about 2 weeks (around 11/02/2020) for legs and anemia  10/19/2020- Tomasa Rand, RN (CCM)  10/12/2020-  Tomasa Rand, RN (CCM)  10/12/2020- Marge Duncans, PA-C (PCP)- Patient presented for follow up visit on  Cellulitis of left lower extremity, Cellulitis of right lower extremity, Abnormal laboratory test. Unna boot applied in office No Medication changes noted. Return in  about 1 week (around 10/19/2020) for with Dr Henrene Pastor.  10/05/2020- Reinaldo Meeker, MD (PCP)- Patient presented for follow up visit on Coagulation defect [facility]), Diabetic glomerulopathy ([place]), CKD (chronic kidney disease) stage 4, GFR 15-29 ml/min ([place]), Persistent atrial fibrillation ([place]), Diabetes mellitus type 2 with peripheral artery disease ([place]), Essential hypertension, Mixed hyperlipidemia, Cellulitis of right lower extremity, Cellulitis of right leg.  Clindamycin HCL 150 mg- 1 tablet 3 times daily for 10 days prescribed. Amb Referral to [facility] Coordination placed. Return in about 1 week (around 10/12/2020) for cellulitis.   Recent consult visits:  02/28/2021- Hardie Pulley, DPM (Podiatry)- Patient presented for follow up visit on Hammer toes of both feet, Diabetes mellitus type 2 with peripheral artery disease ([place]), CKD (chronic kidney disease) stage 4, GFR 15-29 ml/min ([place]).  No Medication changes noted Return in about 3 weeks (around 03/21/2021) for Wound Care.  02/02/2021- Hardie Pulley, DPM (Podiatry)-  Hammer Toe procedure CANCELLED due to bacterial infection of the 2nd/3rd toes. Levofloxacin 250 mg- 1 tablet daily for 3 days prescribed.   01/13/2021- Hardie Pulley, DPM (Podiatry)- Patient presented for follow up visit on Hammer toes of both feet, Diabetes mellitus type 2 with peripheral artery disease ([place]), CKD (chronic kidney disease) stage 4, GFR 15-29 ml/min ([place]). No Medication changes noted. No follow up on file.  12/31/2020- Santiago Bumpers, MD (Nephrology)- Patient presented for initial visit - See Media  12/28/2020- Hardie Pulley, DPM (Podiatry)- Patient presented for follow up visit on Hammer toes of both feet, Diabetes mellitus type 2 with peripheral artery disease ([place]), CKD (chronic kidney disease) stage 4, GFR 15-29 ml/min ([place]), Persistent atrial fibrillation ([place]). No medication changes noted. Return in about 2 weeks (around 01/11/2021) for  Wound Care.  11/29/2020-  Hardie Pulley, DPM (Podiatry)- Patient presented for follow up visit on Hammer toes of both feet, Diabetes mellitus type 2 with peripheral artery disease ([place]), CKD (chronic kidney disease) stage 4, GFR 15-29 ml/min ([place]), Persistent atrial fibrillation ([place]). DME dispensed for post-op use: Surgical Shoe No Medication changes noted. Return for Post-op Care.  10/14/2020- Hardie Pulley, DPM (Podiatry)- Patient presented for follow up visit on Hammer toes of both feet, Diabetes mellitus type 2 with peripheral artery disease ([place]), Onychomycosis. No Medication changes. Return in about 6 weeks (around 11/25/2020).   Hospital visits:  None in previous 6 months   Lab Results  Component Value Date/Time   HGBA1C 7.3 (H) 03/21/2021 09:43 AM   HGBA1C 7.0 (H) 10/05/2020 09:42 AM   MICROALBUR 30 03/21/2021 11:46 AM   MICROALBUR 30 05/17/2020 10:12 AM     BP Readings from Last 3 Encounters:  03/28/21 (!) 124/50  03/21/21 138/62  03/14/21 124/60   *Spoke with Niece- [name]   Have you seen any other providers since your last visit with PCP? No  Any changes in your medications or health? Yes- Per niece patient is having a difficult time with chronic cellulitis and toe infections.  Any side effects from any medications? Yes- Per niece patient is complaining of diarrhea symptoms from Ciprofloxacin 500 mg. Wants to discontinue medication.  Requested advise from [place] Constable, Pharm D. Per Pharmacist: If he's having diarrhea it could be a symptom of a secondary infection. He needs to call the person who prescribed the Cipro immediately and get a test done for C.Diff.  Do you have an symptoms or problems not managed by your medications? No  Any concerns about your health right now? Yes- Per niece cellulitis and toe infections.  Has your provider asked that you check blood pressure, blood sugar, or follow special diet at home? Yes- Patient checks blood sugars daily. Patient does not have a  blood pressure machine. (See last ov bp reading) informed niece it could be beneficial for patient to have blood pressure machine to monitor low blood pressure readings.   Do you get any type of exercise on a regular basis? No  Can you think of a goal you would like to reach for your health? No  Do you have any problems getting your medications? No  Is there anything that you would like to discuss during the appointment? No- Nothing over than problems with legs/feet.   Returned call to niece Mayra Reel, she is aware of Arizona Constable, Pharm D advisement and will contact Dr Henrene Pastor office immediately, she was also reminded to have all of Patients medications, supplements and any blood glucose and blood pressure readings available for review during visit with Arizona Constable, Pharm. D, on 04/06/2021 at 11:00 AM .    Star Rating Drugs:  Farxiga 5 mg- Last filled 03/22/2021 for 30 DS at [facility] Simvastatin 20 mg- Last filled 02/01/2021 for 90 DS at [facility] 5 mg- Last filled 02/25/2021 for 90 DS at [facility]: Last annual wellness visit? Last 02/09/2020- sending message to Rhae Hammock, LPN to schedule annual wellness visit for 2022.  If applicable: Last eye exam / retinopathy screening? None found Last diabetic foot exam- Patient is being managed by Hardie Pulley, DPM (Podiatry) Colonoscopy- None found. COVID-19 Vaccine- Overdue - never done  Zoster Vaccines- Shingrix (1 of 2)- never done Due 05/17/2021- Pneumonia Vaccine 8+ Years old (2 - PCV)- Last completed: May 17, 2020   Fill history gaps: Trazodone 100 mg last filled 11/14/2019  Medications: Outpatient Encounter Medications as of 03/30/2021  Medication Sig   ciprofloxacin (CIPRO) 250 MG tablet Take 1 tablet (250 mg total) by mouth 2 (two) times daily for 7 days.   dapagliflozin propanediol (FARXIGA) 5 MG TABS tablet Take 1 tablet (5 mg total) by mouth daily.   diclofenac Sodium (VOLTAREN) 1 % GEL  SMARTSIG:2 Gram(s) Topical 1 to 4 Times Daily   diltiazem (CARDIZEM CD) 120 MG 24 hr capsule TAKE 1 CAPSULE BY MOUTH EVERY DAY   erythromycin ophthalmic ointment SMARTSIG:1 Sparingly In Eye(s) Twice Daily   ferrous sulfate 325 (65 FE) MG tablet Take 325 mg by mouth daily with breakfast.   finasteride (PROSCAR) 5 MG tablet TAKE 1 TABLET BY MOUTH EVERY DAY   furosemide (LASIX) 20 MG tablet Take 1 tablet (20 mg total) by mouth daily.   ketoconazole (NIZORAL) 2 % shampoo USE AS DIRECTED, ROTATE BETWEEN OVER THE COUNTER SHAMPOOS   Mesalamine 800 MG TBEC TAKE 1 TABLET BY MOUTH TWICE A DAY   pantoprazole (PROTONIX) 20 MG tablet TAKE 1 TABLET BY MOUTH EVERY DAY AS DIRECTED   simvastatin (ZOCOR) 20 MG tablet TAKE 1 TABLET BY MOUTH EVERY DAY   tamsulosin (FLOMAX) 0.4 MG CAPS capsule TAKE 1 CAPSULE BY MOUTH EVERY DAY   tolnaftate (TINACTIN) 1 % powder Apply 1 application topically 2 (two) times daily.   TRADJENTA 5 MG TABS tablet TAKE 1 TABLET BY MOUTH EVERY DAY   traZODone (DESYREL) 100 MG tablet TAKE 1 TABLET BY MOUTH AT NIGHT   triamcinolone cream (KENALOG) 0.1 % APPLY TO AFFECTED AREA TWICE A DAY   No facility-administered encounter medications on file as of 03/30/2021.   Pattricia Boss, [place] Pharmacist Assistant [phone]

## 2021-03-31 ENCOUNTER — Other Ambulatory Visit: Payer: Self-pay

## 2021-03-31 MED ORDER — COLCHICINE 0.6 MG PO TABS: 0.6000 mg | ORAL_TABLET | Freq: Two times a day (BID) | ORAL | 0 refills | Status: DC

## 2021-04-05 ENCOUNTER — Encounter: Payer: Self-pay | Admitting: Family Medicine

## 2021-04-05 ENCOUNTER — Ambulatory Visit: Payer: Medicare Other | Admitting: Family Medicine

## 2021-04-05 ENCOUNTER — Other Ambulatory Visit: Payer: Self-pay

## 2021-04-05 VITALS — BP 116/70 | HR 72 | Temp 97.3°F | Resp 18 | Ht 70.0 in | Wt 200.6 lb

## 2021-04-05 DIAGNOSIS — M1A0720 Idiopathic chronic gout, left ankle and foot, without tophus (tophi): Secondary | ICD-10-CM

## 2021-04-05 MED ORDER — COLCHICINE 0.6 MG PO TABS: 0.3000 mg | ORAL_TABLET | Freq: Every day | ORAL | 0 refills | Status: DC

## 2021-04-05 MED ORDER — ALLOPURINOL 100 MG PO TABS: 100.0000 mg | ORAL_TABLET | Freq: Every day | ORAL | 3 refills | Status: DC

## 2021-04-05 NOTE — Progress Notes (Signed)
Subjective:  Patient ID: Glen Lee, male    DOB: 1933-12-28  Age: 87 y.o. MRN: [id number]  Chief Complaint  Patient presents with   Cellulitis of left lower leg    2 week follow up    HPI Cellulitis left lower leg: Presents for follow-up.  Patient has had renal pills and antibiotics repeatedly in the last 4 to 6 weeks.  I saw him approximately 1 week ago and put him on Cipro again but also checked a uric acid level.  Uric acid level was elevated.  I put him on colchicine 0.6 mg twice daily for 1 week.  He was unable to get this until this past Saturday so has only been on it for 3 days.  This was due to an issue at the pharmacy.  The patient's foot has significantly improved and most of this improvement occurred after he started on the colchicine.  Patient is supposed to undergo foot surgery however this has been postponed due to the cellulitis that he was being treated for.  I am concerned that gout was also contributing.  Current Outpatient Medications on File Prior to Visit  Medication Sig Dispense Refill   dapagliflozin propanediol (FARXIGA) 5 MG TABS tablet Take 1 tablet (5 mg total) by mouth daily. 30 tablet 2   diclofenac Sodium (VOLTAREN) 1 % GEL SMARTSIG:2 Gram(s) Topical 1 to 4 Times Daily     diltiazem (CARDIZEM CD) 120 MG 24 hr capsule TAKE 1 CAPSULE BY MOUTH EVERY DAY 90 capsule 2   erythromycin ophthalmic ointment SMARTSIG:1 Sparingly In Eye(s) Twice Daily     ferrous sulfate 325 (65 FE) MG tablet Take 325 mg by mouth daily with breakfast.     finasteride (PROSCAR) 5 MG tablet TAKE 1 TABLET BY MOUTH EVERY DAY 90 tablet 2   furosemide (LASIX) 20 MG tablet Take 1 tablet (20 mg total) by mouth daily. 1 tablet 0   ketoconazole (NIZORAL) 2 % shampoo USE AS DIRECTED, ROTATE BETWEEN OVER THE COUNTER SHAMPOOS     Mesalamine 800 MG TBEC TAKE 1 TABLET BY MOUTH TWICE A DAY 180 tablet 2   pantoprazole (PROTONIX) 20 MG tablet TAKE 1 TABLET BY MOUTH EVERY DAY AS DIRECTED 90 tablet 3    simvastatin (ZOCOR) 20 MG tablet TAKE 1 TABLET BY MOUTH EVERY DAY 90 tablet 2   tamsulosin (FLOMAX) 0.4 MG CAPS capsule TAKE 1 CAPSULE BY MOUTH EVERY DAY 90 capsule 2   TRADJENTA 5 MG TABS tablet TAKE 1 TABLET BY MOUTH EVERY DAY 90 tablet 3   traZODone (DESYREL) 100 MG tablet TAKE 1 TABLET BY MOUTH AT NIGHT 90 tablet 2   No current facility-administered medications on file prior to visit.   Past Medical History:  Diagnosis Date   Anemia    Arthritis    Benign prostate hyperplasia    Diabetes mellitus without complication (HCC)    Edema    right leg   Enlarged prostate    Esophageal reflux    Essential hypertension    HOH (hard of hearing)    wears hearing aid   Hyperlipidemia    Hypertension    Mixed hyperlipidemia    Tremor    right hand   Type 2 diabetes mellitus with other specified complication (HCC)    Vitamin D deficiency    Past Surgical History:  Procedure Laterality Date   JOINT REPLACEMENT Left    knee   KNEE SURGERY     ORIF ANKLE FRACTURE Right 06/30/2016  Procedure: OPEN REDUCTION INTERNAL FIXATION (ORIF) ANKLE FRACTURE;  Surgeon: Altamese Plainville, MD;  Location: [facility];  Service: Orthopedics;  Laterality: Right;    Family History  Problem Relation Age of Onset   Hyperlipidemia Mother    Diabetes Mother    Colon cancer Father    Diabetes Father    Social History   Socioeconomic History   Marital status: Single    Spouse name: Not on file   Number of children: 3   Years of education: Not on file   Highest education level: Not on file  Occupational History   Not on file  Tobacco Use   Smoking status: Former    Years: 20.00    Types: Cigarettes    Quit date: 08/04/1992    Years since quitting: 28.6   Smokeless tobacco: Never  Vaping Use   Vaping Use: Never used  Substance and Sexual Activity   Alcohol use: Not Currently    Alcohol/week: 0.0 standard drinks   Drug use: Never   Sexual activity: Not Currently  Other Topics Concern   Not on file   Social History Narrative   Concern for pt ability to care for himself. Pt refused placement post hospitalization after rehab   Social Determinants of Health   Financial Resource Strain: Not on file  Food Insecurity: Not on file  Transportation Needs: Not on file  Physical Activity: Not on file  Stress: Not on file  Social Connections: Not on file    Review of Systems  Constitutional:  Negative for chills and fever.  HENT:  Positive for ear pain. Negative for congestion and sore throat.   Respiratory:  Negative for cough.   Cardiovascular:  Positive for leg swelling. Negative for chest pain.  Musculoskeletal:  Negative for arthralgias and myalgias.  Skin:        Legs red and swollen   Psychiatric/Behavioral:  Negative for dysphoric mood. The patient is not nervous/anxious.     Objective:  BP 116/70   Pulse 72   Temp (!) 97.3 F (36.3 C)   Resp 18   Ht 5' 10"  (1.778 m)   Wt 200 lb 9.6 oz (91 kg)   BMI 28.78 kg/m   BP/Weight 04/05/2021 03/28/2021 63/14/9702  Systolic BP 637 858 850  Diastolic BP 70 50 62  Wt. (Lbs) 200.6 202 201  BMI 28.78 28.98 28.84    Physical Exam Vitals reviewed.  Constitutional:      Appearance: Normal appearance.  Musculoskeletal:     Comments: Left foot improved. Decreased erythema and edema.  Not fully resolved, but much better.  Neurological:     Mental Status: He is alert.    Diabetic Foot Exam - Simple   No data filed      Lab Results  Component Value Date   WBC 5.8 03/28/2021   HGB 9.5 (L) 03/28/2021   HCT 29.9 (L) 03/28/2021   PLT 183 03/28/2021   GLUCOSE 153 (H) 03/28/2021   CHOL 120 03/21/2021   TRIG 92 03/21/2021   HDL 55 03/21/2021   LDLCALC 47 03/21/2021   ALT 10 03/28/2021   AST 13 03/28/2021   NA 142 03/28/2021   K 4.2 03/28/2021   CL 106 03/28/2021   CREATININE 1.67 (H) 03/28/2021   BUN 31 (H) 03/28/2021   CO2 22 03/28/2021   TSH 4.900 (H) 03/21/2021   INR 3.22 (H) 08/04/2014   HGBA1C 7.3 (H)  03/21/2021   MICROALBUR 30 03/21/2021  Assessment & Plan:   Problem List Items Addressed This Visit   None Visit Diagnoses     Idiopathic chronic gout of left foot without tophus    -  Primary   Relevant Medications   allopurinol (ZYLOPRIM) 100 MG tablet   colchicine 0.6 MG tablet     .  Meds ordered this encounter  Medications   allopurinol (ZYLOPRIM) 100 MG tablet    Sig: Take 1 tablet (100 mg total) by mouth daily.    Dispense:  90 tablet    Refill:  3   colchicine 0.6 MG tablet    Sig: Take 0.5 tablets (0.3 mg total) by mouth daily.    Dispense:  45 tablet    Refill:  0    No orders of the defined types were placed in this encounter.    Follow-up: No follow-ups on file.  An After Visit Summary was printed and given to the patient.   I,Lauren M Auman,acting as a scribe for Rochel Brome, MD.,have documented all relevant documentation on the behalf of Rochel Brome, MD,as directed by  Rochel Brome, MD while in the presence of Rochel Brome, MD.    Rochel Brome, MD [place] [phone]

## 2021-04-05 NOTE — Assessment & Plan Note (Signed)
Greatly improved.  Complete colchicine bid dosing.  Then put on maintenance dose of 0.6 mg 1/2 daily.  Start on allopurinol 100 mg once daily.

## 2021-04-06 ENCOUNTER — Ambulatory Visit: Payer: Medicare Other

## 2021-04-06 DIAGNOSIS — E1151 Type 2 diabetes mellitus with diabetic peripheral angiopathy without gangrene: Secondary | ICD-10-CM

## 2021-04-06 DIAGNOSIS — M1A0720 Idiopathic chronic gout, left ankle and foot, without tophus (tophi): Secondary | ICD-10-CM

## 2021-04-06 DIAGNOSIS — I4819 Other persistent atrial fibrillation: Secondary | ICD-10-CM

## 2021-04-06 DIAGNOSIS — E782 Mixed hyperlipidemia: Secondary | ICD-10-CM

## 2021-04-06 NOTE — Progress Notes (Signed)
Chronic Care Management Pharmacy Note  04/06/2021 Name:  ROLLA KEDZIERSKI MRN:  [id number] DOB:  [birth year]   Recommendations/Changes made from today's visit: -Patient would like print-out of medications and side-effects before he gets new meds. I told him we could easily print that out at the office whenever we see him and he is prescribed something new. Went over all meds and ADR's extensively today -Started PAP for [place] and Tradjenta  Subjective: CILLIAN GWINNER is an 87 y.o. year old male who is a primary patient of Henrene Pastor, Zeb Comfort, MD.  The CCM team was consulted for assistance with disease management and care coordination needs.    Engaged with patient by telephone for initial visit in response to provider referral for pharmacy case management and/or care coordination services.   Consent to Services:  The patient was given the following information about Chronic Care Management services today, agreed to services, and gave verbal consent: 1. CCM service includes personalized support from designated clinical staff supervised by the primary care provider, including individualized plan of care and coordination with other care providers 2. 24/7 contact phone numbers for assistance for urgent and routine care needs. 3. Service will only be billed when office clinical staff spend 20 minutes or more in a month to coordinate care. 4. Only one practitioner may furnish and bill the service in a calendar month. 5.The patient may stop CCM services at any time (effective at the end of the month) by phone call to the office staff. 6. The patient will be responsible for cost sharing (co-pay) of up to 20% of the service fee (after annual deductible is met). Patient agreed to services and consent obtained.  Patient Care Team: [name], MD as PCP - General (Family Medicine) O'Neal, Cassie Freer, MD as PCP - Cardiology (Cardiology) Thana Ates, RN as Case Manager Lane Hacker, [facility] as Pharmacist (Pharmacist)  Recent office visits:  03/28/2021- Rochel Brome, MD (PCP)- Patient presented for follow up visit on Cellulitis of left lower leg, Edema of left lower leg, Arthralgia of toe of left foot, Dependent rubor. Rule out gout. Possibly pseudogout also. Ciprofloxacin HCL 250 mg - 1 tablet  2 times daily for 7 days prescribed. Return in about 10 days (around 04/07/2021).   03/24/2021- Telephone Encounter- Patient has concerns about side effects of Farxiga 5 mg. No result of call.    03/23/2021- Tomasa Rand, RN (CCM)   03/22/2021- Telephone Encounter- Furosemide changed from 40 mg daily to 20 mg daily.   10/10/2022Rochel Brome, MD (PCP)- Patient presented for follow up visit on Edema of left lower leg, Cellulitis of left lower leg, Diabetes mellitus type 2 with peripheral artery disease ([place]), Mixed hyperlipidemia, Persistent atrial fibrillation ([place]), CKD (chronic kidney disease) stage 4, GFR 15-29 ml/min ([place]), Essential hypertension, Benign prostatic hyperplasia without lower urinary tract symptoms, Need for immunization against influenza, Diabetic glomerulopathy ([place]), Elevated TSH.  No medication changes noted. Return in about 2 weeks (around 04/04/2021) for chronic follow up.   03/14/2021- Rochel Brome, MD (PCP)- Patient presented for follow up visit on Edema of left lower leg, Cellulitis of left lower leg. No Medication changes noted. Return in about 1 week (around 03/21/2021) for chronic fasting.   03/07/2021- Reinaldo Meeker, MD (PCP)- Patient presented for follow up visit on Cellulitis of left lower leg. Ciprofloxacin HCL 500 mg- 1 tablet 2 times daily for 10 days prescribed. Return in about 1 week (around 03/14/2021) for dr. Tobie Poet 9/14/2022Purcell Nails  Henrene Pastor, MD (PCP)- Patient presented for follow up visit on Cellulitis of left lower leg. No medication changes noted. Return in about 3 months (around 05/25/2021). 02/16/2021- Reinaldo Meeker, MD (PCP)- Patient  presented for follow up visit on Cellulitis of left lower leg, Hammer toes of both feet, Edema of right lower leg, Cellulitis of right lower extremity, Chronic venous insufficiency.  No Medication changes noted. Return in about 1 week (around 02/23/2021) for edema legs.   02/11/2021- Reinaldo Meeker, MD (PCP)- Patient presented for follow up visit on Cellulitis of left lower leg. Levofloxacin 250 mg discontinued per patient preference. Ciprofloxacin HCL 500 mg- 1 tablet 2 times daily for 10 days prescribed. Return in about 4 days (around 02/15/2021) for infected toes   02/04/2021- Tomasa Rand, RN (CCM)   01/13/2021- Reinaldo Meeker, MD (PCP)- Patient presented for follow up visit on Persistent atrial fibrillation [facility]), Chronic venous insufficiency, Essential hypertension, Diabetes mellitus type 2 with peripheral artery disease ([place]), Diabetic glomerulopathy ([place]), CKD (chronic kidney disease) stage 4, GFR 15-29 ml/min ([place]), Mixed hyperlipidemia, Preoperative general physical examination.  No Medication changes noted. Return if symptoms worsen or fail to improve.    12/28/2020- Reinaldo Meeker, MD (PCP)- Patient presented for follow up visit on Varicose veins of both lower extremities with complications, Gangrene of toe ([place]).  No Medication changes noted. Return in about 2 weeks (around 01/11/2021).    12/14/2020- Jerrell Belfast, NP (PCP)- Patient presented for Chronic venous insufficiency, Pedal edema.  Unna boot applied in office. VAS [place] ABI w/wo TBI ordered. No Medication changes noted. Return in about 2 weeks (around 12/28/2020) for with Dr Henrene Pastor.   12/06/2020- Reinaldo Meeker, MD (PCP)- Patient presented for follow up visit on Varicose veins of both lower extremities with complications, Edema of right lower leg. No Medication changes noted. Return in about 1 week (around 12/13/2020).    11/23/2020- Tomasa Rand, RN (CCM)   11/12/2020- Reinaldo Meeker, MD (PCP)- Patient presented for follow up visit on  Tinea pedis of left foot, Cellulitis of right lower extremity, Persistent atrial fibrillation ([place]), Essential hypertension, Diabetes mellitus type 2 with peripheral artery disease ([place]), Diabetic glomerulopathy ([place]), CKD (chronic kidney disease) stage 4, GFR 15-29 ml/min ([place]), Benign prostatic hyperplasia without lower urinary tract symptoms, Mixed hyperlipidemia. Tolnaftate 1% Powder- Apply 1 application topically 2 times daily prescribed. Return in about 3 months (around 02/12/2021) for fasting.   11/05/2020- Reinaldo Meeker, MD (PCP)- Patient presented for follow up visit on Cellulitis of left lower leg. No Medication changes noted. Return in about 1 week (around 11/12/2020) for edema legs   5/23/2022Rochel Brome, MD (PCP)- Patient presented for acute visit on Pedal edema, Cellulitis of left lower extremity, Cellulitis of right lower extremity.  Ciprofloxacin HCL 250 mg- 1 tablet 2 times daily for 5 days prescribed. Furosemide 20 mg directions changed from 1 and 1/2 tablet daily to 2 tablets daily (40 mg).  Return in about 5 days (around 11/06/2020) for Dr. Henrene Pastor.   10/19/2020- Reinaldo Meeker, MD (PCP)- Patient presented for follow up visit on Cellulitis of left lower extremity, Cellulitis of right lower extremity, Anemia, unspecified type. Clindamycin HCL 150 mg discontinued due to completed course. Return in about 2 weeks (around 11/02/2020) for legs and anemia   10/19/2020- Tomasa Rand, RN (CCM)   10/12/2020-  Tomasa Rand, RN (CCM)   10/12/2020- Marge Duncans, PA-C (PCP)- Patient presented for follow up visit on  Cellulitis of left lower extremity, Cellulitis of right lower extremity, Abnormal laboratory test. Louretta Parma boot  applied in office No Medication changes noted. Return in about 1 week (around 10/19/2020) for with Dr Henrene Pastor.   10/05/2020- Reinaldo Meeker, MD (PCP)- Patient presented for follow up visit on Coagulation defect [facility]), Diabetic glomerulopathy ([place]), CKD (chronic kidney disease) stage 4,  GFR 15-29 ml/min ([place]), Persistent atrial fibrillation ([place]), Diabetes mellitus type 2 with peripheral artery disease ([place]), Essential hypertension, Mixed hyperlipidemia, Cellulitis of right lower extremity, Cellulitis of right leg.  Clindamycin HCL 150 mg- 1 tablet 3 times daily for 10 days prescribed. Amb Referral to [facility] Coordination placed. Return in about 1 week (around 10/12/2020) for cellulitis.     Recent consult visits:  02/28/2021- Hardie Pulley, DPM (Podiatry)- Patient presented for follow up visit on Hammer toes of both feet, Diabetes mellitus type 2 with peripheral artery disease ([place]), CKD (chronic kidney disease) stage 4, GFR 15-29 ml/min ([place]).  No Medication changes noted Return in about 3 weeks (around 03/21/2021) for Wound Care.   02/02/2021- Hardie Pulley, DPM (Podiatry)-  Hammer Toe procedure CANCELLED due to bacterial infection of the 2nd/3rd toes. Levofloxacin 250 mg- 1 tablet daily for 3 days prescribed.    01/13/2021- Hardie Pulley, DPM (Podiatry)- Patient presented for follow up visit on Hammer toes of both feet, Diabetes mellitus type 2 with peripheral artery disease ([place]), CKD (chronic kidney disease) stage 4, GFR 15-29 ml/min ([place]). No Medication changes noted. No follow up on file.   12/31/2020- Santiago Bumpers, MD (Nephrology)- Patient presented for initial visit - See Media   12/28/2020- Hardie Pulley, DPM (Podiatry)- Patient presented for follow up visit on Hammer toes of both feet, Diabetes mellitus type 2 with peripheral artery disease ([place]), CKD (chronic kidney disease) stage 4, GFR 15-29 ml/min ([place]), Persistent atrial fibrillation ([place]). No medication changes noted. Return in about 2 weeks (around 01/11/2021) for Wound Care.   11/29/2020- Hardie Pulley, DPM (Podiatry)- Patient presented for follow up visit on Hammer toes of both feet, Diabetes mellitus type 2 with peripheral artery disease ([place]), CKD (chronic kidney disease) stage 4, GFR 15-29 ml/min ([place]),  Persistent atrial fibrillation ([place]). DME dispensed for post-op use: [facility] No Medication changes noted. Return for Post-op Care.   10/14/2020- Hardie Pulley, DPM (Podiatry)- Patient presented for follow up visit on Hammer toes of both feet, Diabetes mellitus type 2 with peripheral artery disease ([place]), Onychomycosis. No Medication changes. Return in about 6 weeks (around 11/25/2020).     Hospital visits:  None in previous 6 months       Objective:  Lab Results  Component Value Date   CREATININE 1.67 (H) 03/28/2021   BUN 31 (H) 03/28/2021   GFRNONAA 44 (L) 05/17/2020   GFRAA 50 (L) 05/17/2020   NA 142 03/28/2021   K 4.2 03/28/2021   CALCIUM 9.4 03/28/2021   CO2 22 03/28/2021   GLUCOSE 153 (H) 03/28/2021    Lab Results  Component Value Date/Time   HGBA1C 7.3 (H) 03/21/2021 09:43 AM   HGBA1C 7.0 (H) 10/05/2020 09:42 AM   MICROALBUR 30 03/21/2021 11:46 AM   MICROALBUR 30 05/17/2020 10:12 AM    Last diabetic Eye exam: No results found for: HMDIABEYEEXA  Last diabetic Foot exam: No results found for: HMDIABFOOTEX   Lab Results  Component Value Date   CHOL 120 03/21/2021   HDL 55 03/21/2021   LDLCALC 47 03/21/2021   TRIG 92 03/21/2021   CHOLHDL 2.2 03/21/2021    Hepatic Function Latest Ref Rng & Units 03/28/2021 03/21/2021 11/12/2020  Total Protein 6.0 - 8.5 g/dL  6.3 6.5 7.1  Albumin 3.6 - 4.6 g/dL 4.1 4.1 4.3  AST 0 - 40 IU/L 13 18 14   ALT 0 - 44 IU/L 10 7 12   Alk Phosphatase 44 - 121 IU/L 118 126(H) 132(H)  Total Bilirubin 0.0 - 1.2 mg/dL 0.5 0.7 0.8    Lab Results  Component Value Date/Time   TSH 4.900 (H) 03/21/2021 09:43 AM   TSH 0.713 08/05/2014 01:31 AM   FREET4 1.17 03/21/2021 09:43 AM    CBC Latest Ref Rng & Units 03/28/2021 03/21/2021 02/02/2021  WBC 3.4 - 10.8 x10E3/uL 5.8 6.3 -  Hemoglobin 13.0 - 17.7 g/dL 9.5(L) 9.6(L) 10.5(L)  Hematocrit 37.5 - 51.0 % 29.9(L) 28.1(L) 31.0(L)  Platelets 150 - 450 x10E3/uL 183 179 -    Lab Results   Component Value Date/Time   VD25OH 32.0 05/17/2020 09:49 AM    Clinical ASCVD: No  The ASCVD Risk score (Arnett DK, et al., 2019) failed to calculate for the following reasons:   The 2019 ASCVD risk score is only valid for ages 65 to 62    Depression screen PHQ 2/9 02/23/2021 02/16/2021 01/13/2021  Decreased Interest 0 0 0  Down, Depressed, Hopeless 0 0 0  PHQ - 2 Score 0 0 0  Altered sleeping - - -  Tired, decreased energy - - -  Change in appetite - - -  Feeling bad or failure about yourself  - - -  Trouble concentrating - - -  Moving slowly or fidgety/restless - - -  Suicidal thoughts - - -  PHQ-9 Score - - -     Other: (CHADS2VASc if Afib, MMRC or CAT for COPD, ACT, DEXA)  Social History   Tobacco Use  Smoking Status Former   Years: 20.00   Types: Cigarettes   Quit date: 08/04/1992   Years since quitting: 28.6  Smokeless Tobacco Never   BP Readings from Last 3 Encounters:  04/05/21 116/70  03/28/21 (!) 124/50  03/21/21 138/62   Pulse Readings from Last 3 Encounters:  04/05/21 72  03/28/21 84  03/21/21 78   Wt Readings from Last 3 Encounters:  04/05/21 200 lb 9.6 oz (91 kg)  03/28/21 202 lb (91.6 kg)  03/21/21 201 lb (91.2 kg)   BMI Readings from Last 3 Encounters:  04/05/21 28.78 kg/m  03/28/21 28.98 kg/m  03/21/21 28.84 kg/m    Assessment/Interventions: Review of patient past medical history, allergies, medications, health status, including review of consultants reports, laboratory and other test data, was performed as part of comprehensive evaluation and provision of chronic care management services.   SDOH:  (Social Determinants of Health) assessments and interventions performed: Yes SDOH Interventions    Flowsheet Row Most Recent Value  SDOH Interventions   Financial Strain Interventions Other (Comment)  [PAP]      SDOH Screenings   Alcohol Screen: Not on file  Depression (PHQ2-9): Low Risk    PHQ-2 Score: 0  Financial Resource Strain:  High Risk   Difficulty of Paying Living Expenses: Hard  Food Insecurity: Not on file  Housing: Not on file  Physical Activity: Not on file  Social Connections: Not on file  Stress: Not on file  Tobacco Use: Medium Risk   Smoking Tobacco Use: Former   Smokeless Tobacco Use: Never   Passive Exposure: Not on file  Transportation Needs: Not on file    [place]  Allergies  Allergen Reactions   Clindamycin/Lincomycin Diarrhea    bloody   Doxycycline  Sulfa Antibiotics Other (See Comments)   Torsemide    Keflet [Cephalexin] Rash    Loses control of legs.     Medications Reviewed Today     Reviewed by Lane Hacker, [facility] (Pharmacist) on 04/06/21 at 1243  Med List Status: <None>   Medication Order Taking? Sig Documenting Provider Last Dose Status Informant  allopurinol (ZYLOPRIM) 100 MG tablet [id number] Yes Take 1 tablet (100 mg total) by mouth daily. CoxElnita Maxwell, MD Taking Active   colchicine 0.6 MG tablet [id number] Yes Take 0.5 tablets (0.3 mg total) by mouth daily. Cox, Kirsten, MD Taking Active   dapagliflozin propanediol (FARXIGA) 5 MG TABS tablet [id number] Yes Take 1 tablet (5 mg total) by mouth daily. Cox, Kirsten, MD Taking Active   diclofenac Sodium (VOLTAREN) 1 % GEL [id number]  SMARTSIG:2 Gram(s) Topical 1 to 4 Times Daily [provider]  Active   diltiazem (CARDIZEM CD) 120 MG 24 hr capsule [id number] Yes TAKE 1 CAPSULE BY MOUTH EVERY DAY [name], MD Taking Active   erythromycin ophthalmic ointment [id number]  SMARTSIG:1 Sparingly In Eye(s) Twice Daily [provider]  Active   ferrous sulfate 325 (65 FE) MG tablet [id number]  Take 325 mg by mouth daily with breakfast. [provider]  Active Pharmacy Records  finasteride (PROSCAR) 5 MG tablet [id number] Yes TAKE 1 TABLET BY MOUTH EVERY DAY [name], MD Taking Active   furosemide (LASIX) 20 MG tablet [id number] Yes Take 1 tablet (20 mg total) by mouth daily. Cox,  Kirsten, MD Taking Active   ketoconazole (NIZORAL) 2 % shampoo [id number]  USE AS DIRECTED, ROTATE BETWEEN OVER THE COUNTER SHAMPOOS [provider]  Active   Mesalamine 800 MG TBEC [id number] Yes TAKE 1 TABLET BY MOUTH TWICE A DAY [name], MD Taking Active   pantoprazole (PROTONIX) 20 MG tablet [id number] Yes TAKE 1 TABLET BY MOUTH EVERY DAY AS DIRECTED Cox, Kirsten, MD Taking Active   simvastatin (ZOCOR) 20 MG tablet [id number] Yes TAKE 1 TABLET BY MOUTH EVERY DAY [name], MD Taking Active   tamsulosin [facility]) 0.4 MG CAPS capsule [id number] Yes TAKE 1 CAPSULE BY MOUTH EVERY DAY [name], MD Taking Active   TRADJENTA 5 MG TABS tablet [id number] Yes TAKE 1 TABLET BY MOUTH EVERY DAY [name], MD Taking Active   traZODone (DESYREL) 100 MG tablet [id number]  TAKE 1 TABLET BY MOUTH AT Venetia Maxon, MD  Active             Patient Active Problem List   Diagnosis Date Noted   Idiopathic chronic gout of left foot without tophus 04/05/2021   Arthralgia of toe of left foot 03/28/2021   Dependent rubor 03/28/2021   Elevated TSH 03/22/2021   Edema of left lower leg 03/14/2021   Edema of right lower leg 12/06/2020   Tinea pedis 11/12/2020   Cellulitis of left lower leg 10/12/2020   Coagulation defect ([place]) 10/05/2020   Vitamin D deficiency    BMI 28.0-28.9,adult 10/16/2019   Diabetic glomerulopathy ([place]) 09/12/2019   Mixed hyperlipidemia    Essential hypertension    Diabetes mellitus type 2 with peripheral artery disease ([place])    Benign prostate hyperplasia    Hammer toes of both feet 03/21/2017   Varicose veins of lower extremities with complications 07/62/2633   Chronic venous insufficiency 05/14/2015   Persistent atrial fibrillation (HCC)    CKD (chronic kidney disease) stage 4, GFR 15-29 ml/min ([place]) 08/04/2014  Immunization History  Administered Date(s) Administered   Fluad Quad(high Dose 65+) 05/17/2020,  03/21/2021   Influenza-Unspecified 03/12/2018, 05/14/2019   Pneumococcal Polysaccharide-23 05/17/2020   Tdap 10/13/2019    Conditions to be addressed/monitored:  Hypertension, Hyperlipidemia, Diabetes, and Atrial Fibrillation  Care Plan : [place]  Updates made by Lane Hacker, [place] since 04/06/2021 12:00 AM     Problem: Gout, DM, Edema, BPH, AFib   Priority: High  Onset Date: 04/06/2021     Goal: Disease State Management   Start Date: 04/06/2021  Expected End Date: 04/06/2022  This Visit's Progress: On track  Priority: High  Note:   Current Barriers:  Does not maintain contact with provider office Does not contact provider office for questions/concerns  Pharmacist Clinical Goal(s):  Patient will contact provider office for questions/concerns as evidenced notation of same in electronic health record through collaboration with PharmD and provider.   Interventions: 1:1 collaboration with [name], MD regarding development and update of comprehensive plan of care as evidenced by provider attestation and co-signature Inter-disciplinary care team collaboration (see longitudinal plan of care) Comprehensive medication review performed; medication list updated in electronic medical record  BPH (Goal: improve urination) -Controlled -Night time urination frequency: 1-2/night -Current treatment: Finasteride 66m Tamsulosin 0.474m-Medications previously tried: N/A -Recommended to continue current medication   Atrial Fibrillation (Goal: prevent stroke and major bleeding) -Controlled -CHADSVASC: The ASCVD Risk score (Arnett DK, et al., 2019) failed to calculate for the following reasons:   The 2019 ASCVD risk score is only valid for ages 4082o 7916Current treatment: Rate control: Diltiazem 12042m4 hours Anticoagulation: none -Medications previously tried: N/A -Home BP and HR readings: Doesn't test  -Counseled on importance of regular laboratory  monitoring; -Recommended to continue current medication   Hyperlipidemia: (LDL goal < 100) The ASCVD Risk score (Arnett DK, et al., 2019) failed to calculate for the following reasons:   The 2019 ASCVD risk score is only valid for ages 40 76 79 35b Results  Component Value Date   CHOL 120 03/21/2021   CHOL 159 11/12/2020   CHOL 123 10/05/2020   Lab Results  Component Value Date   HDL 55 03/21/2021   HDL 69 11/12/2020   HDL 58 10/05/2020   Lab Results  Component Value Date   LDLCALC 47 03/21/2021   LDLCALC 78 11/12/2020   LDLCALC 50 10/05/2020   Lab Results  Component Value Date   TRIG 92 03/21/2021   TRIG 62 11/12/2020   TRIG 72 10/05/2020   Lab Results  Component Value Date   CHOLHDL 2.2 03/21/2021   CHOLHDL 2.3 11/12/2020   CHOLHDL 2.1 10/05/2020  No results found for: LDLDIRECT -Controlled -Current treatment: Simvastatin 83m13medications previously tried: N/A  -Current dietary patterns: eats what he wants -Current exercise habits: None due to foot wraps -Educated on Cholesterol goals;  -Recommended to continue current medication  Gout (Goal: Prevent gout flares) Lab Results  Component Value Date   LABURIC 8.5 (H) 03/28/2021  -Controlled -Last Gout Flare: October 2022 -Current treatment  Allopurinol 100mg3m(Started 04/07/21) Colchicine 0.6mg M36mlamine 800mg -26mcations previously tried: N/A  -We discussed:  Counseled patient on low purine diet plan. Counseled patient to reduce consumption of high-fructose corn syrup, sweetened soft drinks, fruit juices, meat, and seafood. Counseled patient to avoid alcohol consumption.  -Counseled on how Allopurinol might make attack worse   Diabetes (A1c goal <8%) Lab Results  Component Value Date   HGBA1C 7.3 (H) 03/21/2021  HGBA1C 7.0 (H) 10/05/2020   HGBA1C 6.9 (H) 05/17/2020   Lab Results  Component Value Date   MICROALBUR 30 03/21/2021   LDLCALC 47 03/21/2021   CREATININE 1.67 (H) 03/28/2021    Lab Results  Component Value Date   NA 142 03/28/2021   K 4.2 03/28/2021   CREATININE 1.67 (H) 03/28/2021   EGFR 40 (L) 03/28/2021   GFRNONAA 44 (L) 05/17/2020   GLUCOSE 153 (H) 03/28/2021   Lab Results  Component Value Date   WBC 5.8 03/28/2021   HGB 9.5 (L) 03/28/2021   HCT 29.9 (L) 03/28/2021   MCV 89 03/28/2021   PLT 183 03/28/2021  -Controlled -Current medications: Dapagliflozin 69m Tradjenta -Medications previously tried: N/A  -Current home glucose readings fasting glucose: Didn't have readings post prandial glucose: Doesn't test -Denies hypoglycemic/hyperglycemic symptoms -Current meal patterns: Patient wasn't interested in talking about this -Current exercise: None -Educated on Complications of diabetes including kidney damage, retinal damage, and cardiovascular disease; -Counseled to check feet daily and get yearly eye exams October 2022: Start PAP process  Patient Goals/Self-Care Activities Patient will:  - take medications as prescribed  Follow Up Plan: The patient has been provided with contact information for the care management team and has been advised to call with any health related questions or concerns.   NArizona Constable Pharm.D. - [phone]   Due to co-pay, will f/u yearly for PAP       Medication Assistance: Application for [employer] Tradjenta  medication assistance program. in process.  Anticipated assistance start date November 2022.  See plan of care for additional detail.  Compliance/Adherence/Medication fill history: [facility] Drugs:  Farxiga 5 mg- Last [facility]22/[facility]21 [facility] Simvastatin 20 mg- [facility]- [facility] 02/25/2021 [facility] 90 [facility] Last annual wellness visit? Last 02/09/2020- sending message to KRhae Hammock LPN to schedule annual wellness visit for 2022.  If applicable: Last eye exam / retinopathy screening? None  found Last diabetic foot exam- Patient is being managed by MHardie Pulley DPM (Podiatry) Colonoscopy- None found. COVID-19 Vaccine- Overdue - never done  Zoster Vaccines- Shingrix (1 of 2)- never done Due 05/17/2021- Pneumonia Vaccine 87 Years old (2 - PCV)- Last completed: May 17, 2020  Patient's preferred pharmacy is:  [id number] [place], [place] - [address] [place] [id number]: [phone]: [phone] pill box? Yes Pt endorses 100% compliance  We discussed: Current pharmacy is preferred with insurance plan and patient is satisfied with pharmacy services Patient decided to: Continue current medication management strategy  Care Plan and Follow Up Patient Decision:  Patient agrees to Care Plan and Follow-up.  Plan: The patient has been provided with contact information for the care management team and has been advised to call with any health related questions or concerns.   Na[place] ConstablePharm.D. - - [phone]

## 2021-04-06 NOTE — Patient Instructions (Signed)
Visit Information   Goals Addressed   None    Patient Care Plan: Cellulitis- ineffective management of medication to help reduce fluid overload in lower extremities     Problem Identified: Medication Adherence (Wellness)      Long-Range Goal: Medication Adherence Maintained   Start Date: 10/19/2020  Expected End Date: 01/18/2021  Recent Progress: On track  Priority: High  Note:    Current Barriers:  Ineffective Self Health Maintenance- Patient reports he is taking all medications as prescribed. Including the lasix.  Unable to self administer medications as prescribed: currently reports taking medications as prescribed. Reports using a pill box which he fills himself.  Pending surgical intervention.  Surgery did not occur due to infection. Seen at MD office this week and cellulitis is healed. Clinical Goal(s):  Collaboration with [name], MD regarding development and update of comprehensive plan of care as evidenced by provider attestation and co-signature Inter-disciplinary care team collaboration (see longitudinal plan of care) patient will work with care management team to address care coordination and chronic disease management needs related to Medication Management and Education   Interventions:  Evaluation of current treatment plan related to DMII, fluid overload and self-management and patient's adherence to plan as established by provider. Collaboration with [name], MD regarding development and update of comprehensive plan of care as evidenced by provider attestation       and co-signature Inter-disciplinary care team collaboration (see longitudinal plan of care) Discussed plans with patient for ongoing care management follow up and provided patient with direct contact information for care management team Discussed and reviewed with patient the reason for taking Lasix and the importance of not skipping or self reducing medications. Reviewed progress  of contnued swelling at this time and that his toes are healing. Again reviewed the signs and symptoms of infection and encouraged patient to call MD office at the onset of increased swelling, pain, redness or drainage.  Encouraged patient to wear shoes as per MD. Reviewed importance of not bumping toes.  Reviewed importance of keeping legs clean and dry. Reviewed importance of elevation of legs. Reviewed signs and symtoms of infection and when to call the MD.  Self Care Activities:  Patient verbalizes understanding of plan to take Lasix as prescribed Self administers medications as prescribed using the aid of a pill box.  Attends all scheduled provider appointments Calls provider office for new concerns or questions Patient Goals: - Take my medications as prescribed.  - I will call my doctor for increase swelling or redness in my legs. - elevate legs as much as I can - wear protective footwear.  Follow Up Plan: Telephone follow up appointment with care management team member scheduled for: 04/21/2021    Patient Care Plan: Diabetes Type 2 - Patient will self monitor weekly as per MD recommendation     Problem Identified: Disease Progression (Diabetes, Type 2)   Priority: Medium  Onset Date: 10/19/2020  Note:   Objective:  Lab Results  Component Value Date   HGBA1C 7.3 (H) 03/21/2021   Lab Results  Component Value Date   CREATININE 1.72 (H) 03/21/2021   CREATININE 1.60 (H) 02/02/2021   CREATININE 1.66 (H) 11/12/2020   Lab Results  Component Value Date   EGFR 39 (L) 03/21/2021   Current Barriers:  Does not have glucometer to monitor blood sugar- Reviewed with patient again if he would self monitor. He reports yes if his supplies are free.  Does not adhere to provider recommendations  re:  MD recommends patient self monitor weekly and record  readings.  Does not adhere to prescribed medication regimen: Patient reports taking all medications as prescribed. Reports he will  start new medications when filled by pharmacy Case Manager Clinical Goal(s):  patient will demonstrate improved adherence to prescribed treatment plan for diabetes self care/management as evidenced by: adherence to prescribed medication regimen, contacting provider for new or worsened symptoms or questions. Patient will self monitor weekly and record on calendar.  Interventions:  Collaboration with [name], MD regarding development and update of comprehensive plan of care as evidenced by provider attestation and co-signature Inter-disciplinary care team collaboration (see longitudinal plan of care) Reviewed medications with patient and discussed importance of medication adherence Discussed plans with patient for ongoing care management follow up and provided patient with direct contact information for care management team Reviewed scheduled/upcoming provider appointments including: this case manager follow up on 04/21/2021 Advised patient, providing education and rationale, to check cbg weekly and record, calling MD office for findings outside established parameters.   Review of patient status, including review of consultants reports, relevant laboratory and other test results, and medications completed. Reviewed Hgb A1c results and goals.  Reviewed with patient and niece the importance of self monitoring. Will make a referral to pharmacy to help with testing supplies Reviewed with patient and niece the increase in A1C Self-Care Activities - Self administers oral medications as prescribed Attends all scheduled provider appointments Patient Goals: - check blood sugar at prescribed times - take the blood sugar log to all doctor visits - take the blood sugar meter to all doctor visits  Follow Up Plan: Telephone follow up appointment with care management team member scheduled for: 04/21/2021    Long-Range Goal: Monitor and Manage My Blood Sugar-Diabetes Type 2   Start Date:  10/19/2020  This Visit's Progress: Not on track  Recent Progress: On track  Priority: Medium  Note:   Timeframe:  Long-Range Goal Priority:  Medium Start Date:      10/19/2020                       Expected End Date:       01/18/2021                Follow Up Date 03/22/2021    Objective: refuses to self monitor. Does not want to pay for balance of meter and supplies after insurance payment. Lab Results  Component Value Date   HGBA1C 7.0 (H) 10/05/2020   Lab Results  Component Value Date   CREATININE 1.47 (H) 10/05/2020   CREATININE 1.46 (H) 05/17/2020   CREATININE 1.34 (H) 02/09/2020   Lab Results  Component Value Date   EGFR 47 (L) 10/05/2020  Current Barriers:  Does not have glucometer to monitor blood sugar- patient reports he does not want a meter and does not want to self monitor. Does not adhere to provider recommendations:  refuses meter Does not adhere to prescribed medication regimen- currently is taking medications as prescribed.  Case Manager Clinical Goal(s):  patient will demonstrate improved adherence to prescribed treatment plan for diabetes self care/management as evidenced by: adherence to prescribed medication regimen contacting provider for new or worsened symptoms or questions.  Encouraged patient to report any new signs of infection to MD immediately. Interventions:  Collaboration with [name], MD regarding development and update of comprehensive plan of care as evidenced by provider attestation and co-signature Inter-disciplinary care team collaboration (see  longitudinal plan of care) Reviewed medications with patient and discussed importance of medication adherence Requested MD send RX to local pharmacy. Reviewed signs and symptoms of high and low blood sugar.  Encouraged patient to eat regular meals and stay hydrated.  Reviewed with patient the interest in self monitoring and he declined. Self-Care Activities - Checks blood sugars as  prescribed and utilize hyper and hypoglycemia protocol as needed Patient Goals: - I will call the doctor for any changes in my condition. - I will tell my niece if I don't feel good.  Follow Up Plan: Telephone follow up appointment with care management team member scheduled for: 01/25/2021        Patient Care Plan: CCM Pharmacy Care Plan     Problem Identified: Gout, DM, Edema, BPH, AFib   Priority: High  Onset Date: 04/06/2021     Goal: Disease State Management   Start Date: 04/06/2021  Expected End Date: 04/06/2022  This Visit's Progress: On track  Priority: High  Note:   Current Barriers:  Does not maintain contact with provider office Does not contact provider office for questions/concerns  Pharmacist Clinical Goal(s):  Patient will contact provider office for questions/concerns as evidenced notation of same in electronic health record through collaboration with PharmD and provider.   Interventions: 1:1 collaboration with [name], MD regarding development and update of comprehensive plan of care as evidenced by provider attestation and co-signature Inter-disciplinary care team collaboration (see longitudinal plan of care) Comprehensive medication review performed; medication list updated in electronic medical record  BPH (Goal: improve urination) -Controlled -Night time urination frequency: 1-2/night -Current treatment: Finasteride 27m Tamsulosin 0.42m-Medications previously tried: N/A -Recommended to continue current medication   Atrial Fibrillation (Goal: prevent stroke and major bleeding) -Controlled -CHADSVASC: The ASCVD Risk score (Arnett DK, et al., 2019) failed to calculate for the following reasons:   The 2019 ASCVD risk score is only valid for ages 4060o 7971Current treatment: Rate control: Diltiazem 12024m4 hours Anticoagulation: none -Medications previously tried: N/A -Home BP and HR readings: Doesn't test  -Counseled on  importance of regular laboratory monitoring; -Recommended to continue current medication   Hyperlipidemia: (LDL goal < 100) The ASCVD Risk score (Arnett DK, et al., 2019) failed to calculate for the following reasons:   The 2019 ASCVD risk score is only valid for ages 40 76 79 69b Results  Component Value Date   CHOL 120 03/21/2021   CHOL 159 11/12/2020   CHOL 123 10/05/2020   Lab Results  Component Value Date   HDL 55 03/21/2021   HDL 69 11/12/2020   HDL 58 10/05/2020   Lab Results  Component Value Date   LDLCALC 47 03/21/2021   LDLCALC 78 11/12/2020   LDLCALC 50 10/05/2020   Lab Results  Component Value Date   TRIG 92 03/21/2021   TRIG 62 11/12/2020   TRIG 72 10/05/2020   Lab Results  Component Value Date   CHOLHDL 2.2 03/21/2021   CHOLHDL 2.3 11/12/2020   CHOLHDL 2.1 10/05/2020  No results found for: LDLDIRECT -Controlled -Current treatment: Simvastatin 28m71medications previously tried: N/A  -Current dietary patterns: eats what he wants -Current exercise habits: None due to foot wraps -Educated on Cholesterol goals;  -Recommended to continue current medication  Gout (Goal: Prevent gout flares) Lab Results  Component Value Date   LABURIC 8.5 (H) 03/28/2021  -Controlled -Last Gout Flare: October 2022 -Current treatment  Allopurinol 100mg76m(Started 04/07/21) Colchicine 0.6mg M26mlamine 800mg -40mcations previously tried:  N/A  -We discussed:  Counseled patient on low purine diet plan. Counseled patient to reduce consumption of high-fructose corn syrup, sweetened soft drinks, fruit juices, meat, and seafood. Counseled patient to avoid alcohol consumption.  -Counseled on how Allopurinol might make attack worse   Diabetes (A1c goal <8%) Lab Results  Component Value Date   HGBA1C 7.3 (H) 03/21/2021   HGBA1C 7.0 (H) 10/05/2020   HGBA1C 6.9 (H) 05/17/2020   Lab Results  Component Value Date   MICROALBUR 30 03/21/2021   LDLCALC 47 03/21/2021    CREATININE 1.67 (H) 03/28/2021   Lab Results  Component Value Date   NA 142 03/28/2021   K 4.2 03/28/2021   CREATININE 1.67 (H) 03/28/2021   EGFR 40 (L) 03/28/2021   GFRNONAA 44 (L) 05/17/2020   GLUCOSE 153 (H) 03/28/2021   Lab Results  Component Value Date   WBC 5.8 03/28/2021   HGB 9.5 (L) 03/28/2021   HCT 29.9 (L) 03/28/2021   MCV 89 03/28/2021   PLT 183 03/28/2021  -Controlled -Current medications: Dapagliflozin 49m Tradjenta -Medications previously tried: N/A  -Current home glucose readings fasting glucose: Didn't have readings post prandial glucose: Doesn't test -Denies hypoglycemic/hyperglycemic symptoms -Current meal patterns: Patient wasn't interested in talking about this -Current exercise: None -Educated on Complications of diabetes including kidney damage, retinal damage, and cardiovascular disease; -Counseled to check feet daily and get yearly eye exams October 2022: Start PAP process  Patient Goals/Self-Care Activities Patient will:  - take medications as prescribed  Follow Up Plan: The patient has been provided with contact information for the care management team and has been advised to call with any health related questions or concerns.   NArizona Constable Pharm.D. - [phone]   Due to co-pay, will f/u yearly for PAP      Mr. WBaineswas given information about Chronic Care Management services today including:  CCM service includes personalized support from designated clinical staff supervised by his physician, including individualized plan of care and coordination with other care providers 24/7 contact phone numbers for assistance for urgent and routine care needs. Standard insurance, coinsurance, copays and deductibles apply for chronic care management only during months in which we provide at least 20 minutes of these services. Most insurances cover these services at 100%, however patients may be responsible for any copay, coinsurance and/or  deductible if applicable. This service may help you avoid the need for more expensive face-to-face services. Only one practitioner may furnish and bill the service in a calendar month. The patient may stop CCM services at any time (effective at the end of the month) by phone call to the office staff.  Patient agreed to services and verbal consent obtained.   The patient verbalized understanding of instructions, educational materials, and care plan provided today and declined offer to receive copy of patient instructions, educational materials, and care plan.  The pharmacy team will reach out to the patient again over the next 90 days.   NLane Hacker RWest Florida Surgery Center Inc

## 2021-04-07 ENCOUNTER — Ambulatory Visit: Payer: Medicare Other | Admitting: Legal Medicine

## 2021-04-07 ENCOUNTER — Other Ambulatory Visit: Payer: Self-pay

## 2021-04-07 ENCOUNTER — Telehealth: Payer: Self-pay

## 2021-04-07 MED ORDER — DAPAGLIFLOZIN PROPANEDIOL 5 MG PO TABS: 5.0000 mg | ORAL_TABLET | Freq: Every day | ORAL | 3 refills | Status: DC

## 2021-04-07 NOTE — Chronic Care Management (AMB) (Signed)
    Chronic Care Management Pharmacy Assistant   Name: Glen Lee  MRN: [id number] DOB: 04-Jan-1934   Patient Assistance Documentation: I have filled out the application for [place] and Farxiga. The Tradjenta application will be mailed to the pt to fill out their portion and to take to Dr Cox's office to fill out and fax. The [place] was applied for online through [employer]. They did not have a paper application. I was given verbal consent by pt Niece [name] to proceed with doing it online for them. Pt niece has been made aware of the next steps. [place] needed the [place] RX be faxed to them as a next step to process their application. I sent the provider a message to get this faxed.   Elray Mcgregor, [place] Clinical Pharmacist Assistant  [phone]    Medications: Outpatient Encounter Medications as of 04/07/2021  Medication Sig   allopurinol (ZYLOPRIM) 100 MG tablet Take 1 tablet (100 mg total) by mouth daily.   colchicine 0.6 MG tablet Take 0.5 tablets (0.3 mg total) by mouth daily.   dapagliflozin propanediol (FARXIGA) 5 MG TABS tablet Take 1 tablet (5 mg total) by mouth daily.   diclofenac Sodium (VOLTAREN) 1 % GEL SMARTSIG:2 Gram(s) Topical 1 to 4 Times Daily   diltiazem (CARDIZEM CD) 120 MG 24 hr capsule TAKE 1 CAPSULE BY MOUTH EVERY DAY   erythromycin ophthalmic ointment SMARTSIG:1 Sparingly In Eye(s) Twice Daily   ferrous sulfate 325 (65 FE) MG tablet Take 325 mg by mouth daily with breakfast.   finasteride (PROSCAR) 5 MG tablet TAKE 1 TABLET BY MOUTH EVERY DAY   furosemide (LASIX) 20 MG tablet Take 1 tablet (20 mg total) by mouth daily.   ketoconazole (NIZORAL) 2 % shampoo USE AS DIRECTED, ROTATE BETWEEN OVER THE COUNTER SHAMPOOS   Mesalamine 800 MG TBEC TAKE 1 TABLET BY MOUTH TWICE A DAY   pantoprazole (PROTONIX) 20 MG tablet TAKE 1 TABLET BY MOUTH EVERY DAY AS DIRECTED   simvastatin (ZOCOR) 20 MG tablet TAKE 1 TABLET BY MOUTH EVERY DAY   tamsulosin (FLOMAX)  0.4 MG CAPS capsule TAKE 1 CAPSULE BY MOUTH EVERY DAY   TRADJENTA 5 MG TABS tablet TAKE 1 TABLET BY MOUTH EVERY DAY   traZODone (DESYREL) 100 MG tablet TAKE 1 TABLET BY MOUTH AT NIGHT   No facility-administered encounter medications on file as of 04/07/2021.

## 2021-04-11 DIAGNOSIS — E1151 Type 2 diabetes mellitus with diabetic peripheral angiopathy without gangrene: Secondary | ICD-10-CM

## 2021-04-11 DIAGNOSIS — E782 Mixed hyperlipidemia: Secondary | ICD-10-CM

## 2021-04-11 DIAGNOSIS — I4819 Other persistent atrial fibrillation: Secondary | ICD-10-CM | POA: Diagnosis not present

## 2021-04-15 ENCOUNTER — Telehealth: Payer: Self-pay

## 2021-04-15 NOTE — Telephone Encounter (Signed)
Patient niece called and stated patient is refusing to take Colchicine 0.6 mg tablet, and allopurinol 100 mg. She wanted Dr. Henrene Pastor to know he is not taking these medication.

## 2021-04-17 NOTE — Telephone Encounter (Signed)
Ok lp

## 2021-04-18 ENCOUNTER — Other Ambulatory Visit: Payer: Self-pay | Admitting: Family Medicine

## 2021-04-18 ENCOUNTER — Other Ambulatory Visit: Payer: Self-pay | Admitting: Legal Medicine

## 2021-04-18 DIAGNOSIS — I10 Essential (primary) hypertension: Secondary | ICD-10-CM

## 2021-04-18 DIAGNOSIS — M1A0720 Idiopathic chronic gout, left ankle and foot, without tophus (tophi): Secondary | ICD-10-CM

## 2021-04-21 ENCOUNTER — Telehealth: Payer: Medicare Other

## 2021-04-28 ENCOUNTER — Ambulatory Visit (INDEPENDENT_AMBULATORY_CARE_PROVIDER_SITE_OTHER): Payer: Medicare Other

## 2021-04-28 DIAGNOSIS — I872 Venous insufficiency (chronic) (peripheral): Secondary | ICD-10-CM

## 2021-04-28 DIAGNOSIS — L03116 Cellulitis of left lower limb: Secondary | ICD-10-CM

## 2021-04-28 DIAGNOSIS — R600 Localized edema: Secondary | ICD-10-CM

## 2021-04-28 DIAGNOSIS — M1A0720 Idiopathic chronic gout, left ankle and foot, without tophus (tophi): Secondary | ICD-10-CM

## 2021-04-28 DIAGNOSIS — E1151 Type 2 diabetes mellitus with diabetic peripheral angiopathy without gangrene: Secondary | ICD-10-CM

## 2021-04-28 NOTE — Patient Instructions (Signed)
Visit Information   PATIENT GOALS/PLAN OF CARE:  Care Plan : Cellulitis- ineffective management of medication to help reduce fluid overload in lower extremities  Updates made by Thana Ates, RN since 04/28/2021 12:00 AM     Problem: Medication Adherence (Wellness)      Long-Range Goal: Medication Adherence Maintained   Start Date: 10/19/2020  Expected End Date: 10/19/2021  This Visit's Progress: Not on track  Recent Progress: On track  Priority: High  Note:    Current Barriers:  Ineffective Self Health Maintenance- Patient reports he is taking all medications as prescribed. Including the lasix.  Unable to self administer medications as prescribed: currently reports taking medications as prescribed. Reports using a pill box which he fills himself.  Pending surgical intervention.  Surgery did not occur due to infection. Seen at MD office this week and cellulitis is healed.  Patient is not taking his medications for gout. Reports he gave him diarrhea and he pooped his pants X3. He refuses it now.  Reports legs are red, swollen with blisters. Weeping.  Reports he wants his legs fixed. Pending office visit tomorrow. Patient was quick to get off the phone.  Niece is taking patient to MD appointment.  Clinical Goal(s):  Collaboration with [name], MD regarding development and update of comprehensive plan of care as evidenced by provider attestation and co-signature Inter-disciplinary care team collaboration (see longitudinal plan of care) patient will work with care management team to address care coordination and chronic disease management needs related to Medication Management and Education   Interventions:  Evaluation of current treatment plan related to DMII, fluid overload and self-management and patient's adherence to plan as established by provider. Collaboration with [name], MD regarding development and update of comprehensive plan of care as evidenced by  provider attestation       and co-signature Inter-disciplinary care team collaboration (see longitudinal plan of care) Discussed plans with patient for ongoing care management follow up and provided patient with direct contact information for care management team Discussed and reviewed with patient the reason for taking Lasix and the importance of not skipping or self reducing medications. Reviewed progress of contnued swelling at this time and that his toes are healing. Again reviewed the signs and symptoms of infection and encouraged patient to call MD office at the onset of increased swelling, pain, redness or drainage.  Encouraged patient to wear shoes as per MD. Reviewed importance of not bumping toes.  Reviewed importance of keeping legs clean and dry. Reviewed importance of elevation of legs. Reviewed signs and symtoms of infection and when to call the MD.  Reviewed with patient the importance of taking all medications as prescribed.  He refuses gout meds.  Self Care Activities:  Patient verbalizes understanding of plan to take Lasix as prescribed Self administers medications as prescribed using the aid of a pill box.  Attends all scheduled provider appointments Calls provider office for new concerns or questions Patient Goals: - Take my medications as prescribed.  - I will call my doctor for increase swelling or redness in my legs. - elevate legs as much as I can - wear protective footwear.  Follow Up Plan: Telephone follow up appointment with care management team member scheduled for: 05/26/2021    Care Plan : Diabetes Type 2 - Patient will self monitor weekly as per MD recommendation  Updates made by Thana Ates, RN since 04/28/2021 12:00 AM     Problem: Disease Progression (Diabetes, Type  2)   Priority: Medium  Onset Date: 10/19/2020  Note:   Objective:  Lab Results  Component Value Date   HGBA1C 7.3 (H) 03/21/2021   Lab Results  Component Value Date   CREATININE  1.72 (H) 03/21/2021   CREATININE 1.60 (H) 02/02/2021   CREATININE 1.66 (H) 11/12/2020   Lab Results  Component Value Date   EGFR 39 (L) 03/21/2021   Current Barriers:  Does not have glucometer to monitor blood sugar- Reviewed with patient again if he would self monitor. He reports yes if his supplies are free.  Does not adhere to provider recommendations re:  MD recommends patient self monitor weekly and record  readings.  Does not adhere to prescribed medication regimen: Patient reports taking all medications as prescribed. Reports he will start new medications when filled by pharmacy Case Manager Clinical Goal(s):  patient will demonstrate improved adherence to prescribed treatment plan for diabetes self care/management as evidenced by: adherence to prescribed medication regimen, contacting provider for new or worsened symptoms or questions. Patient will self monitor weekly and record on calendar.  Interventions:  Collaboration with [name], MD regarding development and update of comprehensive plan of care as evidenced by provider attestation and co-signature Inter-disciplinary care team collaboration (see longitudinal plan of care) Reviewed medications with patient and discussed importance of medication adherence Discussed plans with patient for ongoing care management follow up and provided patient with direct contact information for care management team Reviewed scheduled/upcoming provider appointments including: this case manager follow up on 04/21/2021 Advised patient, providing education and rationale, to check cbg weekly and record, calling MD office for findings outside established parameters.   Review of patient status, including review of consultants reports, relevant laboratory and other test results, and medications completed. Reviewed Hgb A1c results and goals.  Reviewed with patient and niece the importance of self monitoring. Will make a referral to pharmacy to  help with testing supplies Reviewed with patient and niece the increase in A1C Self-Care Activities - Self administers oral medications as prescribed Attends all scheduled provider appointments Patient Goals: - check blood sugar at prescribed times - take the blood sugar log to all doctor visits - take the blood sugar meter to all doctor visits  Follow Up Plan: Telephone follow up appointment with care management team member scheduled for: 04/21/2021    Long-Range Goal: Monitor and Manage My Blood Sugar-Diabetes Type 2   Start Date: 10/19/2020  Expected End Date: 10/19/2021  Recent Progress: Not on track  Priority: Medium  Note:   Timeframe:  Long-Range Goal Priority:  Medium Start Date:      10/19/2020                       Expected End Date:       10/19/2021            Follow Up Date 05/26/2021    Objective: refuses to self monitor. Does not want to pay for balance of meter and supplies after insurance payment. Lab Results  Component Value Date   HGBA1C 7.3 (H) 03/21/2021   Lab Results  Component Value Date   CREATININE 1.67 (H) 03/28/2021   CREATININE 1.72 (H) 03/21/2021   CREATININE 1.60 (H) 02/02/2021   Lab Results  Component Value Date   EGFR 40 (L) 03/28/2021  Current Barriers:  Does not have glucometer to monitor blood sugar- patient reports he does not want a meter and does not want to self monitor. Does  not adhere to provider recommendations:  refuses meter Does not adhere to prescribed medication regimen- currently is taking medications as prescribed. Patient reports that he is taking all his medications as prescribed except the gout meds.  Case Manager Clinical Goal(s):  patient will demonstrate improved adherence to prescribed treatment plan for diabetes self care/management as evidenced by: adherence to prescribed medication regimen contacting provider for new or worsened symptoms or questions.  Encouraged patient to report any new signs of infection to MD  immediately. Interventions:  Collaboration with [name], MD regarding development and update of comprehensive plan of care as evidenced by provider attestation and co-signature Inter-disciplinary care team collaboration (see longitudinal plan of care) Reviewed medications with patient and discussed importance of medication adherence Requested MD send RX to local pharmacy. Reviewed signs and symptoms of high and low blood sugar.  Encouraged patient to eat regular meals and stay hydrated.  Reviewed with patient the interest in self monitoring and he declined. Encouraged patient to continue to follow up as planned with MD in the office tomorrow.  Self-Care Activities - Checks blood sugars as prescribed and utilize hyper and hypoglycemia protocol as needed Patient Goals: - I will call the doctor for any changes in my condition. - I will tell my niece if I don't feel good. -see MD as planned tomorrow  Follow Up Plan: Telephone follow up appointment with care management team member scheduled for: 05/26/2021          The patient verbalized understanding of instructions, educational materials, and care plan provided today and declined offer to receive copy of patient instructions, educational materials, and care plan.   Telephone follow up appointment with care management team member scheduled for:05/26/2021  Tomasa Rand RN, BSN, CEN RN Case Manager - [facility] Mobile: [phone]

## 2021-04-28 NOTE — Chronic Care Management (AMB) (Signed)
Chronic Care Management   CCM RN Visit Note  04/28/2021 Name: Glen Lee MRN: [id number] DOB: May 17, 1934  Subjective: Glen Lee is a 87 y.o. year old male who is a primary care patient of [name], MD. The care management team was consulted for assistance with disease management and care coordination needs.    Engaged with patient by telephone for follow up visit in response to provider referral for case management and/or care coordination services.   Consent to Services:  The patient was given information about Chronic Care Management services, agreed to services, and gave verbal consent prior to initiation of services.  Please see initial visit note for detailed documentation.   Patient agreed to services and verbal consent obtained.   Assessment: Review of patient past medical history, allergies, medications, health status, including review of consultants reports, laboratory and other test data, was performed as part of comprehensive evaluation and provision of chronic care management services.   SDOH (Social Determinants of Health) assessments and interventions performed:    CCM Care Plan  Allergies  Allergen Reactions   Clindamycin/Lincomycin Diarrhea    bloody   Doxycycline    Sulfa Antibiotics Other (See Comments)   Torsemide    Keflet [Cephalexin] Rash    Loses control of legs.     Outpatient Encounter Medications as of 04/28/2021  Medication Sig   dapagliflozin propanediol (FARXIGA) 5 MG TABS tablet Take 1 tablet (5 mg total) by mouth daily.   diclofenac Sodium (VOLTAREN) 1 % GEL SMARTSIG:2 Gram(s) Topical 1 to 4 Times Daily   diltiazem (CARDIZEM CD) 120 MG 24 hr capsule TAKE 1 CAPSULE BY MOUTH EVERY DAY   erythromycin ophthalmic ointment SMARTSIG:1 Sparingly In Eye(s) Twice Daily   ferrous sulfate 325 (65 FE) MG tablet Take 325 mg by mouth daily with breakfast.   finasteride (PROSCAR) 5 MG tablet TAKE 1 TABLET BY MOUTH EVERY DAY   furosemide  (LASIX) 20 MG tablet Take 1 tablet (20 mg total) by mouth daily.   ketoconazole (NIZORAL) 2 % shampoo USE AS DIRECTED, ROTATE BETWEEN OVER THE COUNTER SHAMPOOS   Mesalamine 800 MG TBEC TAKE 1 TABLET BY MOUTH TWICE A DAY   pantoprazole (PROTONIX) 20 MG tablet TAKE 1 TABLET BY MOUTH EVERY DAY AS DIRECTED   simvastatin (ZOCOR) 20 MG tablet TAKE 1 TABLET BY MOUTH EVERY DAY   tamsulosin (FLOMAX) 0.4 MG CAPS capsule TAKE 1 CAPSULE BY MOUTH EVERY DAY   TRADJENTA 5 MG TABS tablet TAKE 1 TABLET BY MOUTH EVERY DAY   traZODone (DESYREL) 100 MG tablet TAKE 1 TABLET BY MOUTH AT NIGHT   allopurinol (ZYLOPRIM) 100 MG tablet Take 1 tablet (100 mg total) by mouth daily. (Patient not taking: Reported on 04/28/2021)   colchicine 0.6 MG tablet TAKE 0.5 TABLETS BY MOUTH DAILY. (Patient not taking: Reported on 04/28/2021)   No facility-administered encounter medications on file as of 04/28/2021.    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   Mixed hyperlipidemia  Essential hypertension    Diabetes mellitus type 2 with peripheral artery disease (HCC)    Benign prostate hyperplasia    Hammer toes of both feet 03/21/2017   Varicose veins of lower extremities with complications 15/72/6203   Chronic venous insufficiency 05/14/2015   Persistent atrial fibrillation (HCC)    CKD (chronic kidney disease) stage 4, GFR 15-29 ml/min ([place]) 08/04/2014    Conditions to be addressed/monitored:DMII and chronic pain and cellulitis  Care Plan : Cellulitis- ineffective  management of medication to help reduce fluid overload in lower extremities  Updates made by Thana Ates, RN since 04/28/2021 12:00 AM     Problem: Medication Adherence (Wellness)      Long-Range Goal: Medication Adherence Maintained   Start Date: 10/19/2020  Expected End Date: 10/19/2021  This Visit's Progress: Not on track  Recent Progress: On track  Priority: High  Note:    Current Barriers:  Ineffective Self Health Maintenance- Patient reports he is taking all medications as prescribed. Including the lasix.  Unable to self administer medications as prescribed: currently reports taking medications as prescribed. Reports using a pill box which he fills himself.  Pending surgical intervention.  Surgery did not occur due to infection. Seen at MD office this week and cellulitis is healed.  Patient is not taking his medications for gout. Reports he gave him diarrhea and he pooped his pants X3. He refuses it now.  Reports legs are red, swollen with blisters. Weeping.  Reports he wants his legs fixed. Pending office visit tomorrow. Patient was quick to get off the phone.  Niece is taking patient to MD appointment.  Clinical Goal(s):  Collaboration with [name], MD regarding development and update of comprehensive plan of care as evidenced by provider attestation and co-signature Inter-disciplinary care team collaboration (see longitudinal plan of care) patient will work with care management team to address care coordination and chronic disease management needs related to Medication Management and Education   Interventions:  Evaluation of current treatment plan related to DMII, fluid overload and self-management and patient's adherence to plan as established by provider. Collaboration with [name], MD regarding development and update of comprehensive plan of care as evidenced by provider attestation       and co-signature Inter-disciplinary care team collaboration  (see longitudinal plan of care) Discussed plans with patient for ongoing care management follow up and provided patient with direct contact information for care management team Discussed and reviewed with patient the reason for taking Lasix and the importance of not skipping or self reducing medications. Reviewed progress of contnued swelling at this time and that his toes are healing. Again reviewed the signs and symptoms of infection and encouraged patient to call MD office at the onset of increased swelling, pain, redness or drainage.  Encouraged patient to wear shoes as per MD. Reviewed importance of not bumping toes.  Reviewed importance of keeping legs clean and dry. Reviewed importance of elevation of legs. Reviewed signs and symtoms of infection and when to call the MD.  Reviewed with patient the importance of taking all medications as prescribed.  He refuses gout meds.  Self Care Activities:  Patient verbalizes understanding of plan to take Lasix as prescribed Self administers medications as prescribed using the aid of a pill box.  Attends all scheduled provider appointments Calls provider office for new concerns or questions Patient Goals: - Take my medications as prescribed.  - I will call my doctor for increase swelling or redness in  my legs. - elevate legs as much as I can - wear protective footwear.  Follow Up Plan: Telephone follow up appointment with care management team member scheduled for: 05/26/2021    Care Plan : Diabetes Type 2 - Patient will self monitor weekly as per MD recommendation  Updates made by Thana Ates, RN since 04/28/2021 12:00 AM     Problem: Disease Progression (Diabetes, Type 2)   Priority: Medium  Onset Date: 10/19/2020  Note:   Objective:  Lab Results  Component Value Date   HGBA1C 7.3 (H) 03/21/2021   Lab Results  Component Value Date   CREATININE 1.72 (H) 03/21/2021   CREATININE 1.60 (H) 02/02/2021   CREATININE 1.66 (H) 11/12/2020    Lab Results  Component Value Date   EGFR 39 (L) 03/21/2021   Current Barriers:  Does not have glucometer to monitor blood sugar- Reviewed with patient again if he would self monitor. He reports yes if his supplies are free.  Does not adhere to provider recommendations re:  MD recommends patient self monitor weekly and record  readings.  Does not adhere to prescribed medication regimen: Patient reports taking all medications as prescribed. Reports he will start new medications when filled by pharmacy Case Manager Clinical Goal(s):  patient will demonstrate improved adherence to prescribed treatment plan for diabetes self care/management as evidenced by: adherence to prescribed medication regimen, contacting provider for new or worsened symptoms or questions. Patient will self monitor weekly and record on calendar.  Interventions:  Collaboration with [name], MD regarding development and update of comprehensive plan of care as evidenced by provider attestation and co-signature Inter-disciplinary care team collaboration (see longitudinal plan of care) Reviewed medications with patient and discussed importance of medication adherence Discussed plans with patient for ongoing care management follow up and provided patient with direct contact information for care management team Reviewed scheduled/upcoming provider appointments including: this case manager follow up on 04/21/2021 Advised patient, providing education and rationale, to check cbg weekly and record, calling MD office for findings outside established parameters.   Review of patient status, including review of consultants reports, relevant laboratory and other test results, and medications completed. Reviewed Hgb A1c results and goals.  Reviewed with patient and niece the importance of self monitoring. Will make a referral to pharmacy to help with testing supplies Reviewed with patient and niece the increase in  A1C Self-Care Activities - Self administers oral medications as prescribed Attends all scheduled provider appointments Patient Goals: - check blood sugar at prescribed times - take the blood sugar log to all doctor visits - take the blood sugar meter to all doctor visits  Follow Up Plan: Telephone follow up appointment with care management team member scheduled for: 04/21/2021    Long-Range Goal: Monitor and Manage My Blood Sugar-Diabetes Type 2   Start Date: 10/19/2020  Expected End Date: 10/19/2021  Recent Progress: Not on track  Priority: Medium  Note:   Timeframe:  Long-Range Goal Priority:  Medium Start Date:      10/19/2020                       Expected End Date:       10/19/2021            Follow Up Date 05/26/2021    Objective: refuses to self monitor. Does not want to pay for balance of meter and supplies after insurance payment. Lab Results  Component Value Date   HGBA1C 7.3 (  H) 03/21/2021   Lab Results  Component Value Date   CREATININE 1.67 (H) 03/28/2021   CREATININE 1.72 (H) 03/21/2021   CREATININE 1.60 (H) 02/02/2021   Lab Results  Component Value Date   EGFR 40 (L) 03/28/2021  Current Barriers:  Does not have glucometer to monitor blood sugar- patient reports he does not want a meter and does not want to self monitor. Does not adhere to provider recommendations:  refuses meter Does not adhere to prescribed medication regimen- currently is taking medications as prescribed. Patient reports that he is taking all his medications as prescribed except the gout meds.  Case Manager Clinical Goal(s):  patient will demonstrate improved adherence to prescribed treatment plan for diabetes self care/management as evidenced by: adherence to prescribed medication regimen contacting provider for new or worsened symptoms or questions.  Encouraged patient to report any new signs of infection to MD immediately. Interventions:  Collaboration with [name], MD  regarding development and update of comprehensive plan of care as evidenced by provider attestation and co-signature Inter-disciplinary care team collaboration (see longitudinal plan of care) Reviewed medications with patient and discussed importance of medication adherence Requested MD send RX to local pharmacy. Reviewed signs and symptoms of high and low blood sugar.  Encouraged patient to eat regular meals and stay hydrated.  Reviewed with patient the interest in self monitoring and he declined. Encouraged patient to continue to follow up as planned with MD in the office tomorrow.  Self-Care Activities - Checks blood sugars as prescribed and utilize hyper and hypoglycemia protocol as needed Patient Goals: - I will call the doctor for any changes in my condition. - I will tell my niece if I don't feel good. -see MD as planned tomorrow  Follow Up Plan: Telephone follow up appointment with care management team member scheduled for: 05/26/2021         Plan:Telephone follow up appointment with care management team member scheduled for:  05/26/2021 Tomasa Rand RN, BSN, CEN RN Case Manager - [facility] Mobile: [phone]

## 2021-04-29 ENCOUNTER — Other Ambulatory Visit: Payer: Self-pay

## 2021-04-29 ENCOUNTER — Ambulatory Visit: Payer: Medicare Other | Admitting: Nurse Practitioner

## 2021-04-29 ENCOUNTER — Other Ambulatory Visit: Payer: Self-pay | Admitting: Legal Medicine

## 2021-04-29 VITALS — BP 138/82 | HR 74 | Temp 97.2°F | Ht 70.0 in | Wt 194.0 lb

## 2021-04-29 DIAGNOSIS — E1151 Type 2 diabetes mellitus with diabetic peripheral angiopathy without gangrene: Secondary | ICD-10-CM | POA: Diagnosis not present

## 2021-04-29 DIAGNOSIS — L03116 Cellulitis of left lower limb: Secondary | ICD-10-CM

## 2021-04-29 DIAGNOSIS — L03115 Cellulitis of right lower limb: Secondary | ICD-10-CM

## 2021-04-29 DIAGNOSIS — I872 Venous insufficiency (chronic) (peripheral): Secondary | ICD-10-CM | POA: Diagnosis not present

## 2021-04-29 NOTE — Progress Notes (Signed)
Subjective:  Patient ID: Glen Lee, male    DOB: [birth year]  Age: 87 y.o. MRN: [id number]  Chief Complaint  Patient presents with   Cellulitis     HPI Jru is an 87 year old Caucasian male that presents today for chronic left lower extremity weeping edema.He is accompanied by a family member , pt's niece, that assists him with transportation to appointments. Onset of symptoms was several months ago. Treatment has included several courses of antibiotics, colchicine, and Lasix. Niece states pt discontinued colchicine prescribed on 04/05/21 due to side effects of diarrhea. Normal ABIs of bilateral lower extremities 12/2020. Pt states he has undergone [place] of lower extremities with normal results. Pt tells me that he is scheduled to undergo surgical hammertoe correction with podiatrist, Dr March Rummage. Procedure postponed pending resolution of chronic toe infection and ulceration. Pt has history of type 2 DM with PAD, and CKD. Former cigarette smoker.   Current Outpatient Medications on File Prior to Visit  Medication Sig Dispense Refill   allopurinol (ZYLOPRIM) 100 MG tablet Take 1 tablet (100 mg total) by mouth daily. (Patient not taking: Reported on 04/28/2021) 90 tablet 3   colchicine 0.6 MG tablet TAKE 0.5 TABLETS BY MOUTH DAILY. (Patient not taking: Reported on 04/28/2021) 45 tablet 3   dapagliflozin propanediol (FARXIGA) 5 MG TABS tablet Take 1 tablet (5 mg total) by mouth daily. 9 tablet 3   diclofenac Sodium (VOLTAREN) 1 % GEL SMARTSIG:2 Gram(s) Topical 1 to 4 Times Daily     diltiazem (CARDIZEM CD) 120 MG 24 hr capsule TAKE 1 CAPSULE BY MOUTH EVERY DAY 90 capsule 2   erythromycin ophthalmic ointment SMARTSIG:1 Sparingly In Eye(s) Twice Daily     ferrous sulfate 325 (65 FE) MG tablet Take 325 mg by mouth daily with breakfast.     finasteride (PROSCAR) 5 MG tablet TAKE 1 TABLET BY MOUTH EVERY DAY 90 tablet 2   furosemide (LASIX) 20 MG tablet Take 1 tablet (20 mg total) by mouth daily. 1  tablet 0   ketoconazole (NIZORAL) 2 % shampoo USE AS DIRECTED, ROTATE BETWEEN OVER THE COUNTER SHAMPOOS     Mesalamine 800 MG TBEC TAKE 1 TABLET BY MOUTH TWICE A DAY 180 tablet 2   pantoprazole (PROTONIX) 20 MG tablet TAKE 1 TABLET BY MOUTH EVERY DAY AS DIRECTED 90 tablet 3   tamsulosin (FLOMAX) 0.4 MG CAPS capsule TAKE 1 CAPSULE BY MOUTH EVERY DAY 90 capsule 2   TRADJENTA 5 MG TABS tablet TAKE 1 TABLET BY MOUTH EVERY DAY 90 tablet 3   traZODone (DESYREL) 100 MG tablet TAKE 1 TABLET BY MOUTH AT NIGHT 90 tablet 2   No current facility-administered medications on file prior to visit.   Past Medical History:  Diagnosis Date   Anemia    Arthritis    Benign prostate hyperplasia    Diabetes mellitus without complication (HCC)    Edema    right leg   Enlarged prostate    Esophageal reflux    Essential hypertension    HOH (hard of hearing)    wears hearing aid   Hyperlipidemia    Hypertension    Mixed hyperlipidemia    Tremor    right hand   Type 2 diabetes mellitus with other specified complication (HCC)    Vitamin D deficiency    Past Surgical History:  Procedure Laterality Date   JOINT REPLACEMENT Left    knee   KNEE SURGERY     ORIF ANKLE FRACTURE Right 06/30/2016  Procedure: OPEN REDUCTION INTERNAL FIXATION (ORIF) ANKLE FRACTURE;  Surgeon: Altamese [place], MD;  Location: [facility];  Service: Orthopedics;  Laterality: Right;    Family History  Problem Relation Age of Onset   Hyperlipidemia Mother    Diabetes Mother    Colon cancer Father    Diabetes Father    Social History   Socioeconomic History   Marital status: Single    Spouse name: Not on file   Number of children: 3   Years of education: Not on file   Highest education level: Not on file  Occupational History   Not on file  Tobacco Use   Smoking status: Former    Years: 20.00    Types: Cigarettes    Quit date: 08/04/1992    Years since quitting: 28.7   Smokeless tobacco: Never  Vaping Use   Vaping Use:  Never used  Substance and Sexual Activity   Alcohol use: Not Currently    Alcohol/week: 0.0 standard drinks   Drug use: Never   Sexual activity: Not Currently  Other Topics Concern   Not on file  Social History Narrative   Concern for pt ability to care for himself. Pt refused placement post hospitalization after rehab   Social Determinants of Health   Financial Resource Strain: High Risk   Difficulty of Paying Living Expenses: Hard  Food Insecurity: Not on file  Transportation Needs: Not on file  Physical Activity: Not on file  Stress: Not on file  Social Connections: Not on file    Review of Systems  Constitutional:  Negative for chills, diaphoresis, fatigue and fever.  HENT:  Negative for congestion, ear pain and sore throat.   Respiratory:  Negative for cough and shortness of breath.   Cardiovascular:  Negative for chest pain and leg swelling.  Gastrointestinal:  Negative for abdominal pain, constipation, diarrhea, nausea and vomiting.  Genitourinary:  Negative for dysuria and urgency.  Musculoskeletal:  Negative for arthralgias and myalgias.  Skin:  Positive for wound (left  lower anterior extremity). Negative for rash.       Redness, weeping edema to left lower leg  Neurological:  Negative for dizziness and headaches.  Psychiatric/Behavioral:  Negative for dysphoric mood.     Objective:  BP 138/82   Pulse 74   Temp (!) 97.2 F (36.2 C)   Ht 5' 10"  (1.778 m)   Wt 194 lb (88 kg)   SpO2 100%   BMI 27.84 kg/m   BP/Weight 04/29/2021 04/05/2021 16/94/5038  Systolic BP 882 800 349  Diastolic BP 82 70 50  Wt. (Lbs) 194 200.6 202  BMI 27.84 28.78 28.98    Physical Exam Vitals reviewed.  Constitutional:      Appearance: He is ill-appearing.  Cardiovascular:     Rate and Rhythm: Normal rate. Rhythm irregular.     Pulses:          Dorsalis pedis pulses are 1+ on the right side and 1+ on the left side.       Posterior tibial pulses are 1+ on the right side and  1+ on the left side.     Heart sounds: Normal heart sounds.  Pulmonary:     Effort: Pulmonary effort is normal.     Breath sounds: Normal breath sounds.  Musculoskeletal:        General: Swelling present.     Left lower leg: Edema present.  Feet:     Right foot:     Toenail Condition: Right toenails  are abnormally thick.     Left foot:     Skin integrity: Ulcer, erythema and warmth present.     Toenail Condition: Left toenails are abnormally thick.  Skin:    General: Skin is warm.     Capillary Refill: Capillary refill takes less than 2 seconds.     Findings: Erythema present.     Comments: Weeping 1+ edema to left lower extremity  Neurological:     Mental Status: He is alert. Mental status is at baseline.        Lab Results  Component Value Date   WBC 5.8 03/28/2021   HGB 9.5 (L) 03/28/2021   HCT 29.9 (L) 03/28/2021   PLT 183 03/28/2021   GLUCOSE 153 (H) 03/28/2021   CHOL 120 03/21/2021   TRIG 92 03/21/2021   HDL 55 03/21/2021   LDLCALC 47 03/21/2021   ALT 10 03/28/2021   AST 13 03/28/2021   NA 142 03/28/2021   K 4.2 03/28/2021   CL 106 03/28/2021   CREATININE 1.67 (H) 03/28/2021   BUN 31 (H) 03/28/2021   CO2 22 03/28/2021   TSH 4.900 (H) 03/21/2021   INR 3.22 (H) 08/04/2014   HGBA1C 7.3 (H) 03/21/2021   MICROALBUR 30 03/21/2021      Assessment & Plan:   1. Chronic venous insufficiency - Ambulatory referral to Vascular Surgery  2. Diabetes mellitus type 2 with peripheral artery disease ([place]) - Ambulatory referral to Vascular Surgery    Dressing applied to left lower leg Elevate bilateral lower extremities Continue prescribed medications   Follow-up: PRN   An After Visit Summary was printed and given to the patient.  I, Rip Harbour, NP, have reviewed all documentation for this visit. The documentation on 04/30/21 for the exam, diagnosis, procedures, and orders are all accurate and complete.    Signed, Rip Harbour, NP [place] [phone]

## 2021-04-29 NOTE — Patient Instructions (Signed)
[facility] An [employer] is a type of bandage (dressing) for the foot and leg. The dressing is a gauze wrap that is soaked with a type of medicine called zinc oxide. The gauze may also include other lotions and medicines that help in wound healing, such as calamine. An [place] boot may be used to treat: Open sores (ulcers) on the foot, heel, or leg. Swelling from disorders that affect the veins or lymphatic system (lymphedema). Skin conditions such as chronic inflammation caused by poor blood flow (stasis dermatitis). The dressing is applied by a health care provider. The gauze is wrapped around your lower extremity in several layers, usually starting at the toes and going upward to the knee. A dry outer wrap goes over the medicated wrap for support and compression.  Before applying the [employer], your health care provider will clean your leg and foot and may apply an antibiotic ointment. You may be asked to raise (elevate) your leg for a while to reduce swelling before the boot is applied. The boot will dry and harden after it is applied. The boot may need to be changed or replaced about twice a week. Follow these instructions at home: [place] as told by your health care provider. You may need to wear a slipper or shoe over the boot that is one or two sizes larger than normal. Check the skin around the boot every day. Tell your health care provider about any concerns. Do not stick anything inside the boot to scratch your skin. Doing that increases your risk of infection. Keep your [employer] clean and dry. Check every day for signs of infection. Check for: Redness, swelling, or pain in your foot or toes. Fluid or blood coming from the boot. Pus or a bad smell coming from the boot. Remove the boot and call your health care provider if you have signs of poor blood flow, such as: Your toes tingle or become numb. Your toes turn cold or turn blue or pale. Your toes are more  swollen or painful. You are unable to move your toes. Activity You may walk with the boot once it has dried. Ask your health care provider how much walking is safe for you. Avoid sitting for a long time without moving. Get up to take short walks as told by your health care provider. This is important to improve blood flow. Bathing Do not take baths, swim, or use a hot tub until your health care provider approves. Ask your health care provider if you may take showers. If your health care provider approves a bath or a shower, do not let the [place] boot get wet. If you take a shower, cover the boot with a watertight covering. If you take a bath, keep your leg with the boot out of the tub. General instructions Keep your leg elevated above the level of your heart while you are sitting or lying down. This will decrease swelling. Do not sit with your knee bent for long periods of time. Take over-the-counter and prescription medicines only as told by your health care provider. Do not use any products that contain nicotine or tobacco, such as cigarettes, e-cigarettes, and chewing tobacco. These can delay healing. If you need help quitting, ask your health care provider. Keep all follow-up visits as told by your health care provider. This is important. Contact a health care provider if: Your skin feels itchy inside the boot. You have a burning sensation, a  rash, or itchy, red, swollen areas of skin (hives) in the boot area. You have a fever or chills. You have any signs of infection, such as: New redness, swelling, or pain. More fluid or blood coming from the boot. Pus or a bad smell coming from the boot. You have increased numbness or pain in your foot or toes. You have any changes in skin color on your foot or toes, such as the skin turning blue or pale or developing patchy areas with spots. Your boot has been damaged or feels like it is no longer fitting properly. Summary An [place] boot is a type of  bandage (dressing) system for the foot and leg. The dressing is a gauze wrap that is soaked with a type of medicine (zinc oxide) to treat foot, heel, or leg ulcers, swelling from disorders that affect the veins or lymphatic system (lymphedema), and skin conditions caused by poor blood flow (stasis dermatitis). This dressing is applied by a health care provider. After it is applied, the boot will dry and harden. The boot may need to be changed or replaced about twice a week. Let your health care provider know if you have any signs of poor blood flow or infection. This information is not intended to replace advice given to you by your health care provider. Make sure you discuss any questions you have with your health care provider. Document Revised: 09/17/2018 Document Reviewed: 02/06/2018 Elsevier Patient Education  2022 [place].  Cellulitis is a skin infection. The infected area is often warm, red, swollen, and sore. It occurs most often in the arms and lower legs. It is very important to get treated for this condition. What are the causes? This condition is caused by bacteria. The bacteria enter through a break in the skin, such as a cut, burn, insect bite, open sore, or crack. What increases the risk? This condition is more likely to occur in people who: Have a weak body defense system (immune system). Have open cuts, burns, bites, or scrapes on the skin. Are older than 85 years of age. Have a blood sugar problem (diabetes). Have a long-lasting (chronic) liver disease (cirrhosis) or kidney disease. Are very overweight (obese). Have a skin problem, such as: Itchy rash (eczema). Slow movement of blood in the veins (venous stasis). Fluid buildup below the skin (edema). Have been treated with high-energy rays (radiation). Use IV drugs. What are the signs or symptoms? Symptoms of this condition include: Skin that is: Red. Streaking. Spotting. Swollen. Sore or painful when you  touch it. Warm. A fever. Chills. Blisters. How is this diagnosed? This condition is diagnosed based on: Medical history. Physical exam. Blood tests. Imaging tests. How is this treated? Treatment for this condition may include: Medicines to treat infections or allergies. Home care, such as: Rest. Placing cold or warm cloths (compresses) on the skin. Hospital care, if the condition is very bad. Follow these instructions at home: Medicines Take over-the-counter and prescription medicines only as told by your doctor. If you were prescribed an antibiotic medicine, take it as told by your doctor. Do not stop taking it even if you start to feel better. General instructions  Drink enough fluid to keep your pee (urine) pale yellow. Do not touch or rub the infected area. Raise (elevate) the infected area above the level of your heart while you are sitting or lying down. Place cold or warm cloths on the area as told by your doctor. Keep all follow-up visits as told by  your doctor. This is important. Contact a doctor if: You have a fever. You do not start to get better after 1-2 days of treatment. Your bone or joint under the infected area starts to hurt after the skin has healed. Your infection comes back. This can happen in the same area or another area. You have a swollen bump in the area. You have new symptoms. You feel ill and have muscle aches and pains. Get help right away if: Your symptoms get worse. You feel very sleepy. You throw up (vomit) or have watery poop (diarrhea) for a long time. You see red streaks coming from the area. Your red area gets larger. Your red area turns dark in color. These symptoms may represent a serious problem that is an emergency. Do not wait to see if the symptoms will go away. Get medical help right away. Call your local emergency services (911 in the [place] Do not drive yourself to the hospital. Summary Cellulitis is a skin infection. The area  is often warm, red, swollen, and sore. This condition is treated with medicines, rest, and cold and warm cloths. Take all medicines only as told by your doctor. Tell your doctor if symptoms do not start to get better after 1-2 days of treatment. This information is not intended to replace advice given to you by your health care provider. Make sure you discuss any questions you have with your health care provider. Document Revised: 10/18/2017 Document Reviewed: 10/18/2017 Elsevier Patient Education  [employer].

## 2021-04-30 ENCOUNTER — Encounter: Payer: Self-pay | Admitting: Nurse Practitioner

## 2021-05-04 ENCOUNTER — Ambulatory Visit: Payer: Medicare Other | Admitting: Legal Medicine

## 2021-05-04 ENCOUNTER — Other Ambulatory Visit: Payer: Self-pay

## 2021-05-04 ENCOUNTER — Encounter: Payer: Self-pay | Admitting: Legal Medicine

## 2021-05-04 VITALS — BP 126/60 | HR 82 | Temp 98.0°F | Resp 16 | Ht 70.0 in | Wt 193.0 lb

## 2021-05-04 DIAGNOSIS — R600 Localized edema: Secondary | ICD-10-CM

## 2021-05-04 NOTE — Patient Instructions (Signed)
Lymphedema Lymphedema is swelling that is caused by the abnormal collection of lymph in the tissues under the skin. Lymph is excess fluid from the tissues in your body that is removed through the lymphatic system. This system is part of your body's defense system (immune system) and includes lymph nodes and lymph vessels. The lymph vessels collect and carry the excess fluid, fats, proteins, and waste from the tissues of the body to the bloodstream. This system also works to clean and remove bacteria and waste products from the body. Lymphedema occurs when the lymphatic system is blocked. When the lymph vessels or lymph nodes are blocked or damaged, lymph does not drain properly. This causes an abnormal buildup of lymph, which leads to swelling in the affected area. This may include the trunk area, or an arm or leg. Lymphedema cannot be cured by medicines, but various methods can be used to help reduce the swelling. What are the causes? The cause of this condition depends on the type of lymphedema that you have. Primary lymphedema is caused by the absence of lymph vessels or having abnormal lymph vessels at birth. Secondary lymphedema occurs when lymph vessels are blocked or damaged. Secondary lymphedema is more common. Common causes of lymph vessel blockage include: Skin infection, such as cellulitis. Infection by parasites (filariasis). Injury. Radiation therapy. Cancer. Formation of scar tissue. Surgery. What are the signs or symptoms? Symptoms of this condition include: Swelling of the arm or leg. A heavy or tight feeling in the arm or leg. Swelling of the feet, toes, or fingers. Shoes or rings may fit more tightly than before. Redness of the skin over the affected area. Limited movement of the affected limb. Sensitivity to touch or discomfort in the affected limb. How is this diagnosed? This condition may be diagnosed based on: Your symptoms and medical history. A physical  exam. Bioimpedance spectroscopy. In this test, painless electrical currents are used to measure fluid levels in your body. Imaging tests, such as: MRI. CT scan. Duplex ultrasound. This test uses sound waves to produce images of the vessels and the blood flow on a screen. Lymphoscintigraphy. In this test, a low dose of a radioactive substance is injected to trace the flow of lymph through your lymph vessels. Lymphangiography. In this test, a contrast dye is injected into the lymph vessel to help show blockages. How is this treated? If an underlying condition is causing the lymphedema, that condition will be treated. For example, antibiotic medicines may be used to treat an infection. Treatment for this condition will depend on the cause of your lymphedema. Treatment may include: Complete decongestive therapy (CDT). This is done by a certified lymphedema therapist to reduce fluid congestion. This therapy includes: Skin care. Compression wrapping of the affected area. Manual lymph drainage. This is a special massage technique that promotes lymph drainage out of a limb. Specific exercises. Certain exercises can help fluid move out of the affected limb. Compression. Various methods may be used to apply pressure to the affected limb to reduce the swelling. They include: Wearing compression stockings or sleeves on the affected limb. Wrapping the affected limb with special bandages. Surgery. This is usually done for severe cases only. For example, surgery may be done if you have trouble moving the limb or if the swelling does not get better with other treatments. Follow these instructions at home: Self-care The affected area is more likely to become injured or infected. Take these steps to help prevent infection: Keep the affected area clean  and dry. Use approved creams or lotions to keep the skin moisturized. Protect your skin from cuts: Use gloves while cooking or gardening. Do not walk  barefoot. If you shave the affected area, use an Copy. Do not wear tight clothes, shoes, or jewelry. Eat a healthy diet that includes a lot of fruits and vegetables. Activity Do exercises as told by your health care provider. Do not sit with your legs crossed. When possible, keep the affected limb raised (elevated) above the level of your heart. Avoid carrying things with an arm that is affected by lymphedema. General instructions Wear compression stockings or sleeves as told by your health care provider. Note any changes in size of the affected limb. You may be instructed to take regular measurements and keep track of them. Take over-the-counter and prescription medicines only as told by your health care provider. If you were prescribed an antibiotic medicine, take or apply it as told by your health care provider. Do not stop using the antibiotic even if you start to feel better or if your condition improves. Do not use heating pads or ice packs on the affected area. Avoid having blood draws, IV insertions, or blood pressure checks on the affected limb. Keep all follow-up visits. This is important. Contact a health care provider if you: Continue to have swelling in your limb. Have fluid leaking from the skin of your swollen limb. Have a cut that does not heal. Have redness or pain in the affected area. Develop purplish spots, rash, blisters, or sores (lesions) on your affected limb. Get help right away if you: Have new swelling in your limb that starts suddenly. Have shortness of breath or chest pain. Have a fever or chills. These symptoms may represent a serious problem that is an emergency. Do not wait to see if the symptoms will go away. Get medical help right away. Call your local emergency services (911 in the [place] Do not drive yourself to the hospital. Summary Lymphedema is swelling that is caused by the abnormal collection of lymph in the tissues under the  skin. Lymph is fluid from the tissues in your body that is removed through the lymphatic system. This system collects and carries excess fluid, fats, proteins, and wastes from the tissues of the body to the bloodstream. Lymphedema causes swelling, pain, and redness in the affected area. This may include the trunk area, or an arm or leg. Treatment for this condition may depend on the cause of your lymphedema. Treatment may include treating the underlying cause, complete decongestive therapy (CDT), compression methods, or surgery. This information is not intended to replace advice given to you by your health care provider. Make sure you discuss any questions you have with your health care provider. Document Revised: 03/24/2020 Document Reviewed: 03/24/2020 Elsevier Patient Education  2022 [employer].

## 2021-05-04 NOTE — Progress Notes (Signed)
Established Patient Office Visit  Subjective:  Patient ID: Glen Lee, male    DOB: [birth year]  Age: 87 y.o. MRN: [id number]  CC:  Chief Complaint  Patient presents with   Leg Swelling    Both leg are swelling since 3 days ago.    HPI Glen Lee presents for chronic stasis edema both legs with recurrent skin breakdown.  Unna boots removed yesterday. He is to see vascular, maybe get lymphedema boots.  He has chronic dry skin on legs and back.  Use eucerin ointment daily    Past Medical History:  Diagnosis Date   Anemia    Arthritis    Benign prostate hyperplasia    Diabetes mellitus without complication (HCC)    Edema    right leg   Enlarged prostate    Esophageal reflux    Essential hypertension    HOH (hard of hearing)    wears hearing aid   Hyperlipidemia    Hypertension    Mixed hyperlipidemia    Tremor    right hand   Type 2 diabetes mellitus with other specified complication (HCC)    Vitamin D deficiency     Past Surgical History:  Procedure Laterality Date   JOINT REPLACEMENT Left    knee   KNEE SURGERY     ORIF ANKLE FRACTURE Right 06/30/2016   Procedure: OPEN REDUCTION INTERNAL FIXATION (ORIF) ANKLE FRACTURE;  Surgeon: Altamese [place], MD;  Location: [facility];  Service: Orthopedics;  Laterality: Right;    Family History  Problem Relation Age of Onset   Hyperlipidemia Mother    Diabetes Mother    Colon cancer Father    Diabetes Father     Social History   Socioeconomic History   Marital status: Single    Spouse name: Not on file   Number of children: 3   Years of education: Not on file   Highest education level: Not on file  Occupational History   Not on file  Tobacco Use   Smoking status: Former    Years: 20.00    Types: Cigarettes    Quit date: 08/04/1992    Years since quitting: 28.7   Smokeless tobacco: Never  Vaping Use   Vaping Use: Never used  Substance and Sexual Activity   Alcohol use: Not Currently    Alcohol/week:  0.0 standard drinks   Drug use: Never   Sexual activity: Not Currently  Other Topics Concern   Not on file  Social History Narrative   Concern for pt ability to care for himself. Pt refused placement post hospitalization after rehab   Social Determinants of Health   Financial Resource Strain: High Risk   Difficulty of Paying Living Expenses: Hard  Food Insecurity: Not on file  Transportation Needs: Not on file  Physical Activity: Not on file  Stress: Not on file  Social Connections: Not on file  Intimate Partner Violence: Not on file    Outpatient Medications Prior to Visit  Medication Sig Dispense Refill   allopurinol (ZYLOPRIM) 100 MG tablet Take 1 tablet (100 mg total) by mouth daily. 90 tablet 3   colchicine 0.6 MG tablet TAKE 0.5 TABLETS BY MOUTH DAILY. 45 tablet 3   dapagliflozin propanediol (FARXIGA) 5 MG TABS tablet Take 1 tablet (5 mg total) by mouth daily. 9 tablet 3   diclofenac Sodium (VOLTAREN) 1 % GEL SMARTSIG:2 Gram(s) Topical 1 to 4 Times Daily     diltiazem (CARDIZEM CD) 120 MG 24 hr capsule  TAKE 1 CAPSULE BY MOUTH EVERY DAY 90 capsule 2   erythromycin ophthalmic ointment SMARTSIG:1 Sparingly In Eye(s) Twice Daily     ferrous sulfate 325 (65 FE) MG tablet Take 325 mg by mouth daily with breakfast.     finasteride (PROSCAR) 5 MG tablet TAKE 1 TABLET BY MOUTH EVERY DAY 90 tablet 2   furosemide (LASIX) 20 MG tablet Take 1 tablet (20 mg total) by mouth daily. 1 tablet 0   ketoconazole (NIZORAL) 2 % shampoo USE AS DIRECTED, ROTATE BETWEEN OVER THE COUNTER SHAMPOOS     Mesalamine 800 MG TBEC TAKE 1 TABLET BY MOUTH TWICE A DAY 180 tablet 2   pantoprazole (PROTONIX) 20 MG tablet TAKE 1 TABLET BY MOUTH EVERY DAY AS DIRECTED 90 tablet 3   simvastatin (ZOCOR) 20 MG tablet TAKE 1 TABLET BY MOUTH EVERY DAY 90 tablet 2   tamsulosin (FLOMAX) 0.4 MG CAPS capsule TAKE 1 CAPSULE BY MOUTH EVERY DAY 90 capsule 2   TRADJENTA 5 MG TABS tablet TAKE 1 TABLET BY MOUTH EVERY DAY 90 tablet  3   traZODone (DESYREL) 100 MG tablet TAKE 1 TABLET BY MOUTH AT NIGHT 90 tablet 2   No facility-administered medications prior to visit.    Allergies  Allergen Reactions   Clindamycin/Lincomycin Diarrhea    bloody   Doxycycline    Sulfa Antibiotics Other (See Comments)   Torsemide    Keflet [Cephalexin] Rash    Loses control of legs.     ROS Review of Systems  Constitutional:  Negative for activity change and appetite change.  HENT:  Negative for congestion.   Eyes:  Negative for visual disturbance.  Respiratory:  Negative for chest tightness and shortness of breath.   Cardiovascular:  Negative for chest pain, palpitations and leg swelling.  Gastrointestinal:  Negative for abdominal distention and abdominal pain.  Genitourinary: Negative.   Musculoskeletal:  Positive for arthralgias.  Neurological: Negative.      Objective:    Physical Exam Vitals reviewed.  Constitutional:      Appearance: Normal appearance.  HENT:     Head: Normocephalic.  Eyes:     Extraocular Movements: Extraocular movements intact.     Conjunctiva/sclera: Conjunctivae normal.     Pupils: Pupils are equal, round, and reactive to light.  Cardiovascular:     Rate and Rhythm: Normal rate and regular rhythm.     Pulses: Normal pulses.     Heart sounds: Normal heart sounds.  Pulmonary:     Effort: Pulmonary effort is normal. No respiratory distress.     Breath sounds: No wheezing.  Abdominal:     General: Abdomen is flat. Bowel sounds are normal. There is no distension.     Palpations: Abdomen is soft.     Tenderness: There is no abdominal tenderness.  Musculoskeletal:     Cervical back: Normal range of motion.     Right lower leg: Edema present.     Left lower leg: Edema present.     Comments: Chronic lymphedema  Skin:    General: Skin is dry.     Capillary Refill: Capillary refill takes less than 2 seconds.     Findings: Rash present.  Neurological:     General: No focal deficit  present.     Mental Status: He is alert.    BP 126/60   Pulse 82   Temp 98 F (36.7 C)   Resp 16   Ht 5' 10"  (1.778 m)   Wt 193 lb (87.5  kg)   SpO2 98%   BMI 27.69 kg/m  Wt Readings from Last 3 Encounters:  05/04/21 193 lb (87.5 kg)  04/29/21 194 lb (88 kg)  04/05/21 200 lb 9.6 oz (91 kg)     Health Maintenance Due  Topic Date Due   OPHTHALMOLOGY EXAM  Never done   Zoster Vaccines- Shingrix (1 of 2) Never done   Pneumonia Vaccine 57+ Years old (2 - PCV) 05/17/2021    There are no preventive care reminders to display for this patient.  Lab Results  Component Value Date   TSH 4.900 (H) 03/21/2021   Lab Results  Component Value Date   WBC 5.8 03/28/2021   HGB 9.5 (L) 03/28/2021   HCT 29.9 (L) 03/28/2021   MCV 89 03/28/2021   PLT 183 03/28/2021   Lab Results  Component Value Date   NA 142 03/28/2021   K 4.2 03/28/2021   CO2 22 03/28/2021   GLUCOSE 153 (H) 03/28/2021   BUN 31 (H) 03/28/2021   CREATININE 1.67 (H) 03/28/2021   BILITOT 0.5 03/28/2021   ALKPHOS 118 03/28/2021   AST 13 03/28/2021   ALT 10 03/28/2021   PROT 6.3 03/28/2021   ALBUMIN 4.1 03/28/2021   CALCIUM 9.4 03/28/2021   ANIONGAP 8 07/03/2016   EGFR 40 (L) 03/28/2021   Lab Results  Component Value Date   CHOL 120 03/21/2021   Lab Results  Component Value Date   HDL 55 03/21/2021   Lab Results  Component Value Date   LDLCALC 47 03/21/2021   Lab Results  Component Value Date   TRIG 92 03/21/2021   Lab Results  Component Value Date   CHOLHDL 2.2 03/21/2021   Lab Results  Component Value Date   HGBA1C 7.3 (H) 03/21/2021      Assessment & Plan:   Problem List Items Addressed This Visit       Other   Edema of right lower leg - Primary Chronic bilateral lymphedema with recurrent skin breakdown, redress with unna boot.  Referred to vascular    Edema of left lower leg Cleaned and re-wrapped with unna boot       Follow-up: Return in about 1 week (around 05/11/2021)  for edema.    Reinaldo Meeker, MD

## 2021-05-11 ENCOUNTER — Other Ambulatory Visit: Payer: Self-pay

## 2021-05-11 ENCOUNTER — Encounter: Payer: Self-pay | Admitting: Legal Medicine

## 2021-05-11 ENCOUNTER — Ambulatory Visit: Payer: Medicare Other | Admitting: Legal Medicine

## 2021-05-11 DIAGNOSIS — I872 Venous insufficiency (chronic) (peripheral): Secondary | ICD-10-CM

## 2021-05-11 DIAGNOSIS — L03116 Cellulitis of left lower limb: Secondary | ICD-10-CM

## 2021-05-11 DIAGNOSIS — L03115 Cellulitis of right lower limb: Secondary | ICD-10-CM | POA: Diagnosis not present

## 2021-05-11 DIAGNOSIS — E1151 Type 2 diabetes mellitus with diabetic peripheral angiopathy without gangrene: Secondary | ICD-10-CM

## 2021-05-11 NOTE — Progress Notes (Signed)
Subjective:  Patient ID: Glen Lee, male    DOB: [birth year]  Age: 87 y.o. MRN: [id number]  Chief Complaint  Patient presents with   Leg Swelling    HPI: continued breakdown both legs.  Some weaping.  Ichthyosis.  Foot cleaned thoroughly.   Current Outpatient Medications on File Prior to Visit  Medication Sig Dispense Refill   dapagliflozin propanediol (FARXIGA) 5 MG TABS tablet Take 1 tablet (5 mg total) by mouth daily. 9 tablet 3   diclofenac Sodium (VOLTAREN) 1 % GEL SMARTSIG:2 Gram(s) Topical 1 to 4 Times Daily     diltiazem (CARDIZEM CD) 120 MG 24 hr capsule TAKE 1 CAPSULE BY MOUTH EVERY DAY 90 capsule 2   erythromycin ophthalmic ointment SMARTSIG:1 Sparingly In Eye(s) Twice Daily     ferrous sulfate 325 (65 FE) MG tablet Take 325 mg by mouth daily with breakfast.     finasteride (PROSCAR) 5 MG tablet TAKE 1 TABLET BY MOUTH EVERY DAY 90 tablet 2   furosemide (LASIX) 20 MG tablet Take 1 tablet (20 mg total) by mouth daily. 1 tablet 0   ketoconazole (NIZORAL) 2 % shampoo USE AS DIRECTED, ROTATE BETWEEN OVER THE COUNTER SHAMPOOS     Mesalamine 800 MG TBEC TAKE 1 TABLET BY MOUTH TWICE A DAY 180 tablet 2   pantoprazole (PROTONIX) 20 MG tablet TAKE 1 TABLET BY MOUTH EVERY DAY AS DIRECTED 90 tablet 3   simvastatin (ZOCOR) 20 MG tablet TAKE 1 TABLET BY MOUTH EVERY DAY 90 tablet 2   tamsulosin (FLOMAX) 0.4 MG CAPS capsule TAKE 1 CAPSULE BY MOUTH EVERY DAY 90 capsule 2   TRADJENTA 5 MG TABS tablet TAKE 1 TABLET BY MOUTH EVERY DAY 90 tablet 3   traZODone (DESYREL) 100 MG tablet TAKE 1 TABLET BY MOUTH AT NIGHT 90 tablet 2   No current facility-administered medications on file prior to visit.   Past Medical History:  Diagnosis Date   Anemia    Arthritis    Benign prostate hyperplasia    Diabetes mellitus without complication (HCC)    Edema    right leg   Enlarged prostate    Esophageal reflux    Essential hypertension    HOH (hard of hearing)    wears hearing aid    Hyperlipidemia    Hypertension    Mixed hyperlipidemia    Tremor    right hand   Type 2 diabetes mellitus with other specified complication (HCC)    Vitamin D deficiency    Past Surgical History:  Procedure Laterality Date   JOINT REPLACEMENT Left    knee   KNEE SURGERY     ORIF ANKLE FRACTURE Right 06/30/2016   Procedure: OPEN REDUCTION INTERNAL FIXATION (ORIF) ANKLE FRACTURE;  Surgeon: Altamese [place], MD;  Location: [facility];  Service: Orthopedics;  Laterality: Right;    Family History  Problem Relation Age of Onset   Hyperlipidemia Mother    Diabetes Mother    Colon cancer Father    Diabetes Father    Social History   Socioeconomic History   Marital status: Single    Spouse name: Not on file   Number of children: 3   Years of education: Not on file   Highest education level: Not on file  Occupational History   Not on file  Tobacco Use   Smoking status: Former    Years: 20.00    Types: Cigarettes    Quit date: 08/04/1992    Years since quitting: 80.7  Smokeless tobacco: Never  Vaping Use   Vaping Use: Never used  Substance and Sexual Activity   Alcohol use: Not Currently    Alcohol/week: 0.0 standard drinks   Drug use: Never   Sexual activity: Not Currently  Other Topics Concern   Not on file  Social History Narrative   Concern for pt ability to care for himself. Pt refused placement post hospitalization after rehab   Social Determinants of Health   Financial Resource Strain: High Risk   Difficulty of Paying Living Expenses: Hard  Food Insecurity: Not on file  Transportation Needs: Not on file  Physical Activity: Not on file  Stress: Not on file  Social Connections: Not on file    Review of Systems  Constitutional:  Negative for chills, fatigue, fever and unexpected weight change.  HENT:  Negative for congestion, ear pain, sinus pain and sore throat.   Respiratory:  Negative for cough and shortness of breath.   Cardiovascular:  Negative for chest pain  and palpitations.  Gastrointestinal:  Negative for abdominal pain, blood in stool, constipation, diarrhea, nausea and vomiting.  Endocrine: Negative for polydipsia.  Genitourinary:  Negative for dysuria.  Musculoskeletal:  Negative for back pain.       Leg swelling  Skin:  Negative for rash.  Neurological:  Negative for headaches.    Objective:  BP 130/64   Pulse 73   Temp (!) 97.5 F (36.4 C)   Resp 16   Ht 5' 10"  (1.778 m)   Wt 193 lb (87.5 kg)   SpO2 98%   BMI 27.69 kg/m   BP/Weight 05/11/2021 05/04/2021 56/81/2751  Systolic BP 700 174 944  Diastolic BP 64 60 82  Wt. (Lbs) 193 193 194  BMI 27.69 27.69 27.84    Physical Exam Vitals reviewed.  Constitutional:      Appearance: Normal appearance.  Cardiovascular:     Rate and Rhythm: Normal rate and regular rhythm.     Pulses: Normal pulses.     Heart sounds: Normal heart sounds. No murmur heard.   No gallop.  Pulmonary:     Effort: Pulmonary effort is normal. No respiratory distress.     Breath sounds: No wheezing.  Skin:    Capillary Refill: Capillary refill takes less than 2 seconds.     Comments: Lymphedema both legs some weaping  Neurological:     Mental Status: He is alert.        Lab Results  Component Value Date   WBC 5.8 03/28/2021   HGB 9.5 (L) 03/28/2021   HCT 29.9 (L) 03/28/2021   PLT 183 03/28/2021   GLUCOSE 153 (H) 03/28/2021   CHOL 120 03/21/2021   TRIG 92 03/21/2021   HDL 55 03/21/2021   LDLCALC 47 03/21/2021   ALT 10 03/28/2021   AST 13 03/28/2021   NA 142 03/28/2021   K 4.2 03/28/2021   CL 106 03/28/2021   CREATININE 1.67 (H) 03/28/2021   BUN 31 (H) 03/28/2021   CO2 22 03/28/2021   TSH 4.900 (H) 03/21/2021   INR 3.22 (H) 08/04/2014   HGBA1C 7.3 (H) 03/21/2021   MICROALBUR 30 03/21/2021      Assessment & Plan:   Problem List Items Addressed This Visit       Cardiovascular and Mediastinum   Chronic venous insufficiency  . Patient HAS CHRONIC lymphedema with recurrent  skin breakdown and weeping rewrap with unna boots        Follow-up: Return in about 1 week (around  05/18/2021) for legs.  An After Visit Summary was printed and given to the patient.  Reinaldo Meeker, MD [facility] [phone]

## 2021-05-16 ENCOUNTER — Ambulatory Visit: Payer: Medicare Other | Admitting: Legal Medicine

## 2021-05-16 ENCOUNTER — Encounter: Payer: Self-pay | Admitting: Legal Medicine

## 2021-05-16 ENCOUNTER — Other Ambulatory Visit: Payer: Self-pay

## 2021-05-16 VITALS — BP 120/58 | HR 65 | Temp 97.5°F | Resp 16 | Ht 70.0 in | Wt 193.0 lb

## 2021-05-16 DIAGNOSIS — R600 Localized edema: Secondary | ICD-10-CM | POA: Diagnosis not present

## 2021-05-16 DIAGNOSIS — I872 Venous insufficiency (chronic) (peripheral): Secondary | ICD-10-CM

## 2021-05-16 NOTE — Progress Notes (Signed)
Subjective:  Patient ID: Glen Lee, male    DOB: 12/05/1933  Age: 87 y.o. MRN: [id number]  Chief Complaint  Patient presents with   Edema    HPI: recurrent lymphedema and skin breakdown both lower legs.  We have been using unna boots, vascular consult soon.  He does not like unna boots, but he has weeping and cellulitis without them. Skin hygiene poor including toes that had to be cleaned.   Current Outpatient Medications on File Prior to Visit  Medication Sig Dispense Refill   dapagliflozin propanediol (FARXIGA) 5 MG TABS tablet Take 1 tablet (5 mg total) by mouth daily. 9 tablet 3   diclofenac Sodium (VOLTAREN) 1 % GEL SMARTSIG:2 Gram(s) Topical 1 to 4 Times Daily     diltiazem (CARDIZEM CD) 120 MG 24 hr capsule TAKE 1 CAPSULE BY MOUTH EVERY DAY 90 capsule 2   erythromycin ophthalmic ointment SMARTSIG:1 Sparingly In Eye(s) Twice Daily     ferrous sulfate 325 (65 FE) MG tablet Take 325 mg by mouth daily with breakfast.     finasteride (PROSCAR) 5 MG tablet TAKE 1 TABLET BY MOUTH EVERY DAY 90 tablet 2   furosemide (LASIX) 20 MG tablet Take 1 tablet (20 mg total) by mouth daily. 1 tablet 0   ketoconazole (NIZORAL) 2 % shampoo USE AS DIRECTED, ROTATE BETWEEN OVER THE COUNTER SHAMPOOS     Mesalamine 800 MG TBEC TAKE 1 TABLET BY MOUTH TWICE A DAY 180 tablet 2   pantoprazole (PROTONIX) 20 MG tablet TAKE 1 TABLET BY MOUTH EVERY DAY AS DIRECTED 90 tablet 3   simvastatin (ZOCOR) 20 MG tablet TAKE 1 TABLET BY MOUTH EVERY DAY 90 tablet 2   tamsulosin (FLOMAX) 0.4 MG CAPS capsule TAKE 1 CAPSULE BY MOUTH EVERY DAY 90 capsule 2   TRADJENTA 5 MG TABS tablet TAKE 1 TABLET BY MOUTH EVERY DAY 90 tablet 3   traZODone (DESYREL) 100 MG tablet TAKE 1 TABLET BY MOUTH AT NIGHT 90 tablet 2   No current facility-administered medications on file prior to visit.   Past Medical History:  Diagnosis Date   Anemia    Arthritis    Benign prostate hyperplasia    Diabetes mellitus without complication (HCC)     Edema    right leg   Enlarged prostate    Esophageal reflux    Essential hypertension    HOH (hard of hearing)    wears hearing aid   Hyperlipidemia    Hypertension    Mixed hyperlipidemia    Tremor    right hand   Type 2 diabetes mellitus with other specified complication (HCC)    Vitamin D deficiency    Past Surgical History:  Procedure Laterality Date   JOINT REPLACEMENT Left    knee   KNEE SURGERY     ORIF ANKLE FRACTURE Right 06/30/2016   Procedure: OPEN REDUCTION INTERNAL FIXATION (ORIF) ANKLE FRACTURE;  Surgeon: Altamese [place], MD;  Location: [place];  Service: Orthopedics;  Laterality: Right;    Family History  Problem Relation Age of Onset   Hyperlipidemia Mother    Diabetes Mother    Colon cancer Father    Diabetes Father    Social History   Socioeconomic History   Marital status: Single    Spouse name: Not on file   Number of children: 3   Years of education: Not on file   Highest education level: Not on file  Occupational History   Not on file  Tobacco Use  Smoking status: Former    Years: 20.00    Types: Cigarettes    Quit date: 08/04/1992    Years since quitting: 28.8   Smokeless tobacco: Never  Vaping Use   Vaping Use: Never used  Substance and Sexual Activity   Alcohol use: Not Currently    Alcohol/week: 0.0 standard drinks   Drug use: Never   Sexual activity: Not Currently  Other Topics Concern   Not on file  Social History Narrative   Concern for pt ability to care for himself. Pt refused placement post hospitalization after rehab   Social Determinants of Health   Financial Resource Strain: High Risk   Difficulty of Paying Living Expenses: Hard  Food Insecurity: Not on file  Transportation Needs: Not on file  Physical Activity: Not on file  Stress: Not on file  Social Connections: Not on file    Review of Systems  Constitutional:  Negative for chills, fatigue, fever and unexpected weight change.  HENT:  Negative for congestion,  ear pain, nosebleeds, sinus pain and sore throat.   Respiratory:  Negative for cough and shortness of breath.   Cardiovascular:  Negative for chest pain and palpitations.  Gastrointestinal:  Negative for abdominal pain, blood in stool, constipation, diarrhea, nausea and vomiting.  Endocrine: Negative for polydipsia.  Genitourinary:  Negative for dysuria.  Musculoskeletal:  Negative for back pain.  Skin:  Negative for rash.  Neurological:  Negative for headaches.    Objective:  BP (!) 120/58   Pulse 65   Temp (!) 97.5 F (36.4 C)   Resp 16   Ht 5' 10"  (1.778 m)   Wt 193 lb (87.5 kg)   SpO2 100%   BMI 27.69 kg/m   BP/Weight 05/16/2021 05/11/2021 98/33/8250  Systolic BP 539 767 341  Diastolic BP 58 64 60  Wt. (Lbs) 193 193 193  BMI 27.69 27.69 27.69    Physical Exam Vitals reviewed.  Constitutional:      Appearance: Normal appearance.  Cardiovascular:     Rate and Rhythm: Normal rate and regular rhythm.     Pulses: Normal pulses.     Heart sounds: Normal heart sounds. No murmur heard.   No gallop.  Pulmonary:     Effort: Pulmonary effort is normal. No respiratory distress.     Breath sounds: Normal breath sounds. No wheezing.  Musculoskeletal:     Right lower leg: Edema present.     Left lower leg: Edema present.  Skin:    Comments: Bilateral skin scaling with ichthyosis, he has weeping areas.  Neurological:     Mental Status: He is alert.        Lab Results  Component Value Date   WBC 5.8 03/28/2021   HGB 9.5 (L) 03/28/2021   HCT 29.9 (L) 03/28/2021   PLT 183 03/28/2021   GLUCOSE 153 (H) 03/28/2021   CHOL 120 03/21/2021   TRIG 92 03/21/2021   HDL 55 03/21/2021   LDLCALC 47 03/21/2021   ALT 10 03/28/2021   AST 13 03/28/2021   NA 142 03/28/2021   K 4.2 03/28/2021   CL 106 03/28/2021   CREATININE 1.67 (H) 03/28/2021   BUN 31 (H) 03/28/2021   CO2 22 03/28/2021   TSH 4.900 (H) 03/21/2021   INR 3.22 (H) 08/04/2014   HGBA1C 7.3 (H) 03/21/2021    MICROALBUR 30 03/21/2021      Assessment & Plan:   Problem List Items Addressed This Visit       Cardiovascular and  Mediastinum   Chronic venous insufficiency - Primary Patient has chronic venous insufficiency with lymphedema     Other   Edema of right lower leg Re-rap with unna boot after cleaning leg    Edema of left lower leg Re-wrap with unna boot after cleansing leg  .         Follow-up: Return in about 4 days (around 05/20/2021) for change unna boot.  An After Visit Summary was printed and given to the patient.  Reinaldo Meeker, MD [facility] [phone]

## 2021-05-20 ENCOUNTER — Other Ambulatory Visit: Payer: Self-pay

## 2021-05-20 ENCOUNTER — Encounter: Payer: Self-pay | Admitting: Legal Medicine

## 2021-05-20 ENCOUNTER — Ambulatory Visit: Payer: Medicare Other | Admitting: Legal Medicine

## 2021-05-20 VITALS — BP 130/60 | HR 61 | Temp 97.6°F | Resp 16 | Ht 70.0 in | Wt 193.0 lb

## 2021-05-20 DIAGNOSIS — E1151 Type 2 diabetes mellitus with diabetic peripheral angiopathy without gangrene: Secondary | ICD-10-CM | POA: Diagnosis not present

## 2021-05-20 DIAGNOSIS — E1121 Type 2 diabetes mellitus with diabetic nephropathy: Secondary | ICD-10-CM

## 2021-05-20 DIAGNOSIS — I8390 Asymptomatic varicose veins of unspecified lower extremity: Secondary | ICD-10-CM

## 2021-05-20 DIAGNOSIS — I4819 Other persistent atrial fibrillation: Secondary | ICD-10-CM | POA: Diagnosis not present

## 2021-05-20 DIAGNOSIS — R7989 Other specified abnormal findings of blood chemistry: Secondary | ICD-10-CM

## 2021-05-20 DIAGNOSIS — I83893 Varicose veins of bilateral lower extremities with other complications: Secondary | ICD-10-CM | POA: Diagnosis not present

## 2021-05-20 DIAGNOSIS — E559 Vitamin D deficiency, unspecified: Secondary | ICD-10-CM

## 2021-05-20 DIAGNOSIS — I890 Lymphedema, not elsewhere classified: Secondary | ICD-10-CM

## 2021-05-20 DIAGNOSIS — I10 Essential (primary) hypertension: Secondary | ICD-10-CM

## 2021-05-20 DIAGNOSIS — R600 Localized edema: Secondary | ICD-10-CM

## 2021-05-20 DIAGNOSIS — M1A0720 Idiopathic chronic gout, left ankle and foot, without tophus (tophi): Secondary | ICD-10-CM

## 2021-05-20 DIAGNOSIS — E782 Mixed hyperlipidemia: Secondary | ICD-10-CM

## 2021-05-20 DIAGNOSIS — N400 Enlarged prostate without lower urinary tract symptoms: Secondary | ICD-10-CM

## 2021-05-20 NOTE — Progress Notes (Signed)
Subjective:  Patient ID: Glen Lee, male    DOB: 12-26-1933  Age: 87 y.o. MRN: [id number]  Chief Complaint  Patient presents with   Leg Swelling    HPI: chronic lymphedema and skin breakdown improved with unna boot wrapping.  He keep his legs dependent despite our discussions.  He is to see vascular soon- hopefully he can get a lymphedema pump.  Patient presents for follow up of hypertension.  Patient tolerating cardizem well with side effects.  Patient was diagnosed with hypertension 2010 so has been treated for hypertension for 12 years.Patient is working on maintaining diet and exercise regimen and follows up as directed. Complication include none.   Patient present with type 2 diabetes.  Specifically, this is type 2, noninsulin requiring diabetes, complicated by renal failure.  Compliance with treatment has been good; patient take medicines as directed, maintains diet and exercise regimen, follows up as directed, and is keeping glucose diary.  Date of  diagnosis 2010.  Depression screen has been performed.Tobacco screen nonsmoker. Current medicines for diabetes trajenta, farxiga.  Patient is on none for renal protection and simvastatin for cholesterol control.  Patient performs foot exams daily and last ophthalmologic exam was needs eye exam.    Current Outpatient Medications on File Prior to Visit  Medication Sig Dispense Refill   dapagliflozin propanediol (FARXIGA) 5 MG TABS tablet Take 1 tablet (5 mg total) by mouth daily. 9 tablet 3   diclofenac Sodium (VOLTAREN) 1 % GEL SMARTSIG:2 Gram(s) Topical 1 to 4 Times Daily     diltiazem (CARDIZEM CD) 120 MG 24 hr capsule TAKE 1 CAPSULE BY MOUTH EVERY DAY 90 capsule 2   erythromycin ophthalmic ointment SMARTSIG:1 Sparingly In Eye(s) Twice Daily     ferrous sulfate 325 (65 FE) MG tablet Take 325 mg by mouth daily with breakfast.     finasteride (PROSCAR) 5 MG tablet TAKE 1 TABLET BY MOUTH EVERY DAY 90 tablet 2   furosemide (LASIX) 20 MG  tablet Take 1 tablet (20 mg total) by mouth daily. 1 tablet 0   ketoconazole (NIZORAL) 2 % shampoo USE AS DIRECTED, ROTATE BETWEEN OVER THE COUNTER SHAMPOOS     Mesalamine 800 MG TBEC TAKE 1 TABLET BY MOUTH TWICE A DAY 180 tablet 2   pantoprazole (PROTONIX) 20 MG tablet TAKE 1 TABLET BY MOUTH EVERY DAY AS DIRECTED 90 tablet 3   simvastatin (ZOCOR) 20 MG tablet TAKE 1 TABLET BY MOUTH EVERY DAY 90 tablet 2   tamsulosin (FLOMAX) 0.4 MG CAPS capsule TAKE 1 CAPSULE BY MOUTH EVERY DAY 90 capsule 2   TRADJENTA 5 MG TABS tablet TAKE 1 TABLET BY MOUTH EVERY DAY 90 tablet 3   traZODone (DESYREL) 100 MG tablet TAKE 1 TABLET BY MOUTH AT NIGHT 90 tablet 2   No current facility-administered medications on file prior to visit.   Past Medical History:  Diagnosis Date   Anemia    Arthritis    Benign prostate hyperplasia    Diabetes mellitus without complication (HCC)    Edema    right leg   Enlarged prostate    Esophageal reflux    Essential hypertension    HOH (hard of hearing)    wears hearing aid   Hyperlipidemia    Hypertension    Mixed hyperlipidemia    Tremor    right hand   Type 2 diabetes mellitus with other specified complication (HCC)    Vitamin D deficiency    Past Surgical History:  Procedure Laterality  Date   JOINT REPLACEMENT Left    knee   KNEE SURGERY     ORIF ANKLE FRACTURE Right 06/30/2016   Procedure: OPEN REDUCTION INTERNAL FIXATION (ORIF) ANKLE FRACTURE;  Surgeon: Altamese [place], MD;  Location: [place];  Service: Orthopedics;  Laterality: Right;    Family History  Problem Relation Age of Onset   Hyperlipidemia Mother    Diabetes Mother    Colon cancer Father    Diabetes Father    Social History   Socioeconomic History   Marital status: Single    Spouse name: Not on file   Number of children: 3   Years of education: Not on file   Highest education level: Not on file  Occupational History   Not on file  Tobacco Use   Smoking status: Former    Years: 20.00     Types: Cigarettes    Quit date: 08/04/1992    Years since quitting: 28.8   Smokeless tobacco: Never  Vaping Use   Vaping Use: Never used  Substance and Sexual Activity   Alcohol use: Not Currently    Alcohol/week: 0.0 standard drinks   Drug use: Never   Sexual activity: Not Currently  Other Topics Concern   Not on file  Social History Narrative   Concern for pt ability to care for himself. Pt refused placement post hospitalization after rehab   Social Determinants of Health   Financial Resource Strain: High Risk   Difficulty of Paying Living Expenses: Hard  Food Insecurity: Not on file  Transportation Needs: Not on file  Physical Activity: Not on file  Stress: Not on file  Social Connections: Not on file    Review of Systems  Constitutional:  Negative for chills, fatigue, fever and unexpected weight change.  HENT:  Negative for congestion, ear pain, sinus pain and sore throat.   Respiratory:  Negative for cough and shortness of breath.   Cardiovascular:  Negative for chest pain and palpitations.  Gastrointestinal:  Negative for abdominal pain, blood in stool, constipation, diarrhea, nausea and vomiting.  Endocrine: Negative for polydipsia.  Genitourinary:  Negative for dysuria.  Musculoskeletal:  Negative for back pain.  Skin:  Positive for color change and wound. Negative for rash.       Leg swelling bilateral.  Neurological:  Negative for headaches.    Objective:  BP 130/60   Pulse 61   Temp 97.6 F (36.4 C)   Resp 16   Ht 5' 10"  (1.778 m)   Wt 193 lb (87.5 kg)   SpO2 97%   BMI 27.69 kg/m   BP/Weight 05/20/2021 05/16/2021 72/62/0355  Systolic BP 974 163 845  Diastolic BP 60 58 64  Wt. (Lbs) 193 193 193  BMI 27.69 27.69 27.69    Physical Exam  Diabetic Foot Exam - Simple   Simple Foot Form Diabetic Foot exam was performed with the following findings: Yes 05/20/2021  8:46 AM  Visual Inspection See comments: Yes Sensation Testing Intact to touch and  monofilament testing bilaterally: Yes Pulse Check Posterior Tibialis and Dorsalis pulse intact bilaterally: Yes Comments Chronic edema legs, with some weeping      Lab Results  Component Value Date   WBC 7.5 05/20/2021   HGB 9.7 (L) 05/20/2021   HCT 30.5 (L) 05/20/2021   PLT 240 05/20/2021   GLUCOSE 121 (H) 05/20/2021   CHOL 120 03/21/2021   TRIG 92 03/21/2021   HDL 55 03/21/2021   LDLCALC 47 03/21/2021   ALT 8 05/20/2021  AST 12 05/20/2021   NA 140 05/20/2021   K 4.2 05/20/2021   CL 100 05/20/2021   CREATININE 1.62 (H) 05/20/2021   BUN 24 05/20/2021   CO2 24 05/20/2021   TSH 6.230 (H) 05/20/2021   INR 3.22 (H) 08/04/2014   HGBA1C 6.8 (H) 05/20/2021   MICROALBUR 30 03/21/2021      Assessment & Plan:   Problem List Items Addressed This Visit       Cardiovascular and Mediastinum   Persistent atrial fibrillation [facility]) Patient has a diagnosis of chronic atrial fibrillation.   Patient is on eliquis and has controlled ventricular response.  Patient is CV stable .    Varicose veins of lower extremities with complications He has varicose veins both legs with chronic edema, referred to vascular for chronic lymphedema    Essential hypertension   Relevant Orders   Comprehensive metabolic panel (Completed)   CBC with Differential/Platelet (Completed) An individual hypertension care plan was established and reinforced today.  The patient's status was assessed using clinical findings on exam and labs or diagnostic tests. The patient's success at meeting treatment goals on disease specific evidence-based guidelines and found to be well controlled. SELF MANAGEMENT: The patient and I together assessed ways to personally work towards obtaining the recommended goals. RECOMMENDATIONS: avoid decongestants found in common cold remedies, decrease consumption of alcohol, perform routine monitoring of BP with home BP cuff, exercise, reduction of dietary salt, take medicines as  prescribed, try not to miss doses and quit smoking.  Regular exercise and maintaining a healthy weight is needed.  Stress reduction may help. A CLINICAL SUMMARY including written plan identify barriers to care unique to individual due to social or financial issues.  We attempt to mutually creat solutions for individual and family understanding.     Diabetes mellitus type 2 with peripheral artery disease ([place]) - Primary   Relevant Orders   Hemoglobin A1c (Completed) An individual care plan for diabetes was established and reinforced today.  The patient's status was assessed using clinical findings on exam, labs and diagnostic testing. Patient success at meeting goals based on disease specific evidence-based guidelines and found to be good controlled. Medications were assessed and patient's understanding of the medical issues , including barriers were assessed. Recommend adherence to a diabetic diet, a graduated exercise program, HgbA1c level is checked quarterly, and urine microalbumin performed yearly .  Annual mono-filament sensation testing performed. Lower blood pressure and control hyperlipidemia is important. Get annual eye exams and annual flu shots and smoking cessation discussed.  Self management goals were discussed.      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  The patient's status was assessed using clinical findings on exam, labs and diagnostic testing. Patient success at meeting goals based on disease specific evidence-based guidelines and found to be good controlled. Medications were assessed and patient's understanding of the medical issues , including barriers were assessed. Recommend adherence to a diabetic diet, a graduated exercise program, HgbA1c level is checked quarterly, and urine microalbumin performed yearly .  Annual mono-filament sensation testing performed. Lower blood pressure and control  hyperlipidemia is important. Get annual eye exams and annual flu shots and smoking cessation discussed.  Self management goals were discussed.      Musculoskeletal and Integument   Idiopathic chronic gout of left foot without tophus AN INDIVIDUAL CARE PLAN for gout was established and reinforced today.  The patient's status was assessed using clinical  findings on exam, labs, and other diagnostic testing. Patient's success at meeting treatment goals based on disease specific evidence-bassed guidelines and found to be in good control. RECOMMENDATIONS include maintaining present medicines and treatment.      Genitourinary   Benign prostate hyperplasia   Relevant Orders   PSA (Completed) AN INDIVIDUAL CARE PLAN BPH was established and reinforced today.  The patient's status was assessed using clinical findings on exam, labs, and other diagnostic testing. Patient's success at meeting treatment goals based on disease specific evidence-bassed guidelines and found to be in good control. RECOMMENDATIONS include maintaining present medicines and treatment.      Other   Mixed hyperlipidemia AN INDIVIDUAL CARE PLAN for hyperlipidemia/ cholesterol was established and reinforced today.  The patient's status was assessed using clinical findings on exam, lab and other diagnostic tests. The patient's disease status was assessed based on evidence-based guidelines and found to be well controlled. MEDICATIONS were reviewed. SELF MANAGEMENT GOALS have been discussed and patient's success at attaining the goal of low cholesterol was assessed. RECOMMENDATION given include regular exercise 3 days a week and low cholesterol/low fat diet. CLINICAL SUMMARY including written plan to identify barriers unique to the patient due to social or economic  reasons was discussed.     Vitamin D deficiency AN INDIVIDUAL CARE PLAN vitamin D deficiency was established and reinforced today.  The patient's status was assessed using  clinical findings on exam, labs, and other diagnostic testing. Patient's success at meeting treatment goals based on disease specific evidence-bassed guidelines and found to be in good control. RECOMMENDATIONS include maintaining present medicines and treatment.     Edema of right lower leg   Relevant Orders   Ambulatory referral to Home Health Chronic edema with skin breakdown leg    Edema of left lower leg   Relevant Orders   Ambulatory referral to [place] Chronic edema with skin breakdown    Elevated TSH   Relevant Orders   TSH (Completed) TSH is high, start levothyroxine   Other Visit Diagnoses     Lymphedema       Relevant Orders   Ambulatory referral to Home Health Chronic lymphedema, refer to vascular     .     Orders Placed This Encounter  Procedures   WOUND CULTURE   Comprehensive metabolic panel   TSH   Hemoglobin A1c   CBC with Differential/Platelet   PSA   Ambulatory referral to Home Health      Follow-up: Return in about 3 months (around 08/18/2021).  An After Visit Summary was printed and given to the patient.  Reinaldo Meeker, MD [facility] [phone]

## 2021-05-21 LAB — CBC WITH DIFFERENTIAL/PLATELET
Basophils Absolute: 0.1 10*3/uL (ref 0.0–0.2)
Basos: 1 %
EOS (ABSOLUTE): 0.3 10*3/uL (ref 0.0–0.4)
Eos: 4 %
Hematocrit: 30.5 % — ABNORMAL LOW (ref 37.5–51.0)
Hemoglobin: 9.7 g/dL — ABNORMAL LOW (ref 13.0–17.7)
Immature Grans (Abs): 0 10*3/uL (ref 0.0–0.1)
Immature Granulocytes: 1 %
Lymphocytes Absolute: 0.8 10*3/uL (ref 0.7–3.1)
Lymphs: 10 %
MCH: 27.2 pg (ref 26.6–33.0)
MCHC: 31.8 g/dL (ref 31.5–35.7)
MCV: 85 fL (ref 79–97)
Monocytes Absolute: 0.6 10*3/uL (ref 0.1–0.9)
Monocytes: 8 %
Neutrophils Absolute: 5.7 10*3/uL (ref 1.4–7.0)
Neutrophils: 76 %
Platelets: 240 10*3/uL (ref 150–450)
RBC: 3.57 x10E6/uL — ABNORMAL LOW (ref 4.14–5.80)
RDW: 14.4 % (ref 11.6–15.4)
WBC: 7.5 10*3/uL (ref 3.4–10.8)

## 2021-05-21 LAB — HEMOGLOBIN A1C
Est. average glucose Bld gHb Est-mCnc: 148 mg/dL
Hgb A1c MFr Bld: 6.8 % — ABNORMAL HIGH (ref 4.8–5.6)

## 2021-05-21 LAB — PSA: Prostate Specific Ag, Serum: 0.1 ng/mL (ref 0.0–4.0)

## 2021-05-21 LAB — COMPREHENSIVE METABOLIC PANEL
ALT: 8 IU/L (ref 0–44)
AST: 12 IU/L (ref 0–40)
Albumin/Globulin Ratio: 1.4 (ref 1.2–2.2)
Albumin: 4 g/dL (ref 3.6–4.6)
Alkaline Phosphatase: 125 IU/L — ABNORMAL HIGH (ref 44–121)
BUN/Creatinine Ratio: 15 (ref 10–24)
BUN: 24 mg/dL (ref 8–27)
Bilirubin Total: 0.6 mg/dL (ref 0.0–1.2)
CO2: 24 mmol/L (ref 20–29)
Calcium: 9.5 mg/dL (ref 8.6–10.2)
Chloride: 100 mmol/L (ref 96–106)
Creatinine, Ser: 1.62 mg/dL — ABNORMAL HIGH (ref 0.76–1.27)
Globulin, Total: 2.9 g/dL (ref 1.5–4.5)
Glucose: 121 mg/dL — ABNORMAL HIGH (ref 70–99)
Potassium: 4.2 mmol/L (ref 3.5–5.2)
Sodium: 140 mmol/L (ref 134–144)
Total Protein: 6.9 g/dL (ref 6.0–8.5)
eGFR: 41 mL/min/{1.73_m2} — ABNORMAL LOW (ref >59)

## 2021-05-21 LAB — TSH: TSH: 6.23 u[IU]/mL — ABNORMAL HIGH (ref 0.450–4.500)

## 2021-05-22 ENCOUNTER — Other Ambulatory Visit: Payer: Self-pay | Admitting: Legal Medicine

## 2021-05-22 ENCOUNTER — Encounter: Payer: Self-pay | Admitting: Legal Medicine

## 2021-05-22 DIAGNOSIS — E039 Hypothyroidism, unspecified: Secondary | ICD-10-CM

## 2021-05-22 MED ORDER — LEVOTHYROXINE SODIUM 25 MCG PO TABS: 25.0000 ug | ORAL_TABLET | Freq: Every day | ORAL | 3 refills | Status: AC

## 2021-05-22 NOTE — Progress Notes (Signed)
Glucose 121, kidney tests stage 3a, liver tests normal, TSH high 6.23, start levothyroxine 73mg a day and recheck in 6 weeks, A1c 6.8 good, anemia of chronic disease, normal b12, iron and reticulocyte count in past, PSA 0.1 normal lp

## 2021-05-25 ENCOUNTER — Ambulatory Visit: Payer: Medicare Other | Admitting: Legal Medicine

## 2021-05-26 ENCOUNTER — Ambulatory Visit: Payer: Medicare Other | Admitting: Legal Medicine

## 2021-05-26 ENCOUNTER — Encounter: Payer: Self-pay | Admitting: Legal Medicine

## 2021-05-26 ENCOUNTER — Ambulatory Visit (INDEPENDENT_AMBULATORY_CARE_PROVIDER_SITE_OTHER): Payer: Medicare Other

## 2021-05-26 VITALS — BP 122/68 | HR 88 | Temp 97.6°F | Ht 70.0 in | Wt 193.0 lb

## 2021-05-26 DIAGNOSIS — L03119 Cellulitis of unspecified part of limb: Secondary | ICD-10-CM

## 2021-05-26 DIAGNOSIS — Q820 Hereditary lymphedema: Secondary | ICD-10-CM | POA: Diagnosis not present

## 2021-05-26 DIAGNOSIS — L03116 Cellulitis of left lower limb: Secondary | ICD-10-CM | POA: Diagnosis not present

## 2021-05-26 DIAGNOSIS — I872 Venous insufficiency (chronic) (peripheral): Secondary | ICD-10-CM

## 2021-05-26 DIAGNOSIS — E1151 Type 2 diabetes mellitus with diabetic peripheral angiopathy without gangrene: Secondary | ICD-10-CM

## 2021-05-26 DIAGNOSIS — I10 Essential (primary) hypertension: Secondary | ICD-10-CM

## 2021-05-26 DIAGNOSIS — R600 Localized edema: Secondary | ICD-10-CM

## 2021-05-26 MED ORDER — CIPROFLOXACIN HCL 500 MG PO TABS
500.0000 mg | ORAL_TABLET | Freq: Two times a day (BID) | ORAL | 0 refills | Status: AC
Start: 2021-05-26 — End: 2021-06-05

## 2021-05-26 NOTE — Chronic Care Management (AMB) (Signed)
Chronic Care Management   CCM RN Visit Note  05/26/2021 Name: Glen Lee MRN: [id number] DOB: Sep 18, 1933  Subjective: Glen Lee is a 87 y.o. year old male who is a primary care patient of [name], MD. The care management team was consulted for assistance with disease management and care coordination needs.    Engaged with patient by telephone for follow up visit in response to provider referral for case management and/or care coordination services.   Consent to Services:  The patient was given information about Chronic Care Management services, agreed to services, and gave verbal consent prior to initiation of services.  Please see initial visit note for detailed documentation.   Patient agreed to services and verbal consent obtained.   Assessment: Review of patient past medical history, allergies, medications, health status, including review of consultants reports, laboratory and other test data, was performed as part of comprehensive evaluation and provision of chronic care management services.   SDOH (Social Determinants of Health) assessments and interventions performed:    CCM Care Plan  Allergies  Allergen Reactions   Clindamycin/Lincomycin Diarrhea    bloody   Doxycycline    Sulfa Antibiotics Other (See Comments)   Torsemide    Keflet [Cephalexin] Rash    Loses control of legs.     Outpatient Encounter Medications as of 05/26/2021  Medication Sig   ciprofloxacin (CIPRO) 500 MG tablet Take 1 tablet (500 mg total) by mouth 2 (two) times daily for 10 days.   dapagliflozin propanediol (FARXIGA) 5 MG TABS tablet Take 1 tablet (5 mg total) by mouth daily.   diclofenac Sodium (VOLTAREN) 1 % GEL SMARTSIG:2 Gram(s) Topical 1 to 4 Times Daily   diltiazem (CARDIZEM CD) 120 MG 24 hr capsule TAKE 1 CAPSULE BY MOUTH EVERY DAY   erythromycin ophthalmic ointment SMARTSIG:1 Sparingly In Eye(s) Twice Daily   ferrous sulfate 325 (65 FE) MG tablet Take 325 mg by  mouth daily with breakfast.   finasteride (PROSCAR) 5 MG tablet TAKE 1 TABLET BY MOUTH EVERY DAY   furosemide (LASIX) 20 MG tablet Take 1 tablet (20 mg total) by mouth daily.   ketoconazole (NIZORAL) 2 % shampoo USE AS DIRECTED, ROTATE BETWEEN OVER THE COUNTER SHAMPOOS   levothyroxine (SYNTHROID) 25 MCG tablet Take 1 tablet (25 mcg total) by mouth daily.   Mesalamine 800 MG TBEC TAKE 1 TABLET BY MOUTH TWICE A DAY   pantoprazole (PROTONIX) 20 MG tablet TAKE 1 TABLET BY MOUTH EVERY DAY AS DIRECTED   simvastatin (ZOCOR) 20 MG tablet TAKE 1 TABLET BY MOUTH EVERY DAY   tamsulosin (FLOMAX) 0.4 MG CAPS capsule TAKE 1 CAPSULE BY MOUTH EVERY DAY   TRADJENTA 5 MG TABS tablet TAKE 1 TABLET BY MOUTH EVERY DAY   traZODone (DESYREL) 100 MG tablet TAKE 1 TABLET BY MOUTH AT NIGHT   No facility-administered encounter medications on file as of 05/26/2021.    Patient Active Problem List   Diagnosis Date Noted   Idiopathic chronic gout of left foot without tophus 04/05/2021   Arthralgia of toe of left foot 03/28/2021   Dependent rubor 03/28/2021   Elevated TSH 03/22/2021   Edema of left lower leg 03/14/2021   Edema of right lower leg 12/06/2020   Tinea pedis 11/12/2020   Cellulitis of left lower leg 10/12/2020   Coagulation defect ([place]) 10/05/2020   Vitamin D deficiency    BMI 28.0-28.9,adult 10/16/2019   Diabetic glomerulopathy ([place]) 09/12/2019   Mixed hyperlipidemia    Essential hypertension  Diabetes mellitus type 2 with peripheral artery disease (HCC)    Benign prostate hyperplasia    Hammer toes of both feet 03/21/2017   Varicose veins of lower extremities with complications 45/36/4680   Chronic venous insufficiency 05/14/2015   Persistent atrial fibrillation (HCC)    CKD (chronic kidney disease) stage 4, GFR 15-29 ml/min ([place]) 08/04/2014    Conditions to be addressed/monitored: DM cellulitis and lymphedema  Care Plan : RN Case Manager Plan of Care  Updates made by Thana Ates, RN  since 05/26/2021 12:00 AM     Problem: No plan of care established for management of Chronic disease states( chronic lymphedema, DM, cellulitis)   Priority: High     Long-Range Goal: Development of Plan of care for chrinic diseas management ( chronic lymphedema,DM)   Start Date: 05/26/2021  Expected End Date: 05/26/2022  This Visit's Progress: On track  Priority: High  Note:   Current Barriers:  Knowledge Deficits related to plan of care for management of DMII and lymphedema and medications  Chronic Disease Management support and education needs related to DMII and cellulitis, lymphedema, and medications   RNCM Clinical Goal(s):  Patient will verbalize understanding of plan for management of DMII and cellulitis, chronic lymphedema as evidenced by patient report review of electronic medical record  through collaboration with RN Care manager, provider, and care team. 05/26/2021  Patient reports that he is doing terrible. Reports that his legs hurts. States they stay "wet"  Saw MD today and legs were rewrapped. Reports that he is taking all his medications as prescribed.    Interventions: 1:1 collaboration with primary care provider regarding development and update of comprehensive plan of care as evidenced by provider attestation and co-signature Inter-disciplinary care team collaboration (see longitudinal plan of care) Evaluation of current treatment plan related to  self management and patient's adherence to plan as established by provider   Diabetes Interventions:  (Status:  New goal.) Long Term Goal Assessed patient's understanding of A1c goal: <6.5% Provided education to patient about basic DM disease process Reviewed medications with patient and discussed importance of medication adherence Counseled on importance of regular laboratory monitoring as prescribed Discussed plans with patient for ongoing care management follow up and provided patient with direct contact information for  care management team Encouraged patient to take all his medications as prescribed. Encouraged patient to attend visits with the wound center Reviewed concern with patient that he is having increased struggle taking care of himself. Reviewed benefits of assistive living. Reports he will think about it.  Lab Results  Component Value Date   HGBA1C 6.8 (H) 05/20/2021   Pain Interventions:  (Status:  New goal.) Long Term Goal Pain assessment performed Medications reviewed Reviewed provider established plan for pain management Discussed importance of adherence to all scheduled medical appointments Counseled on the importance of reporting any/all new or changed pain symptoms or management strategies to pain management provider Encouraged patient to keep legs elevated to reduce swelling Reviewed low salt diet  Patient Goals/Self-Care Activities: Take all medications as prescribed Attend all scheduled provider appointments Call pharmacy for medication refills 3-7 days in advance of running out of medications Call provider office for new concerns or questions  check feet daily for cuts, sores or redness drink 6 to 8 glasses of water each day Keep legs wrapped as directed Follow up at the wound center as directed. Consider assisted living  Follow Up Plan:  Telephone follow up appointment with care management team member scheduled for:  07/21/2021  at 3pm     Plan:Telephone follow up appointment with care management team member scheduled for:  07/31/2021 at [place], BSN, CEN RN Case Manager - [facility] Mobile: [phone]

## 2021-05-26 NOTE — Patient Instructions (Signed)
Visit Information  Thank you for taking time to visit with me today. Please don't hesitate to contact me if I can be of assistance to you before our next scheduled telephone appointment.  Following are the goals we discussed today:  Take all medications as prescribed Attend all scheduled provider appointments Call pharmacy for medication refills 3-7 days in advance of running out of medications Call provider office for new concerns or questions  check feet daily for cuts, sores or redness drink 6 to 8 glasses of water each day Keep legs wrapped as directed Follow up at the wound center as directed. Consider assisted living  Our next appointment is by telephone on 07/21/2021 at 3pm   Please call the care guide team at [phone] if you need to cancel or reschedule your appointment.   If you are experiencing a Mental Health or [place] or need someone to talk to, please call the Suicide and Crisis Lifeline: 988 call the [place] National Suicide Prevention Lifeline: (934)400-4418 or TTY: 980-285-5462 TTY (929)616-7421) to talk to a trained counselor call 1-800-273-TALK (toll free, 24 hour hotline) call 911   The patient verbalized understanding of instructions, educational materials, and care plan provided today and agreed to receive a mailed copy of patient instructions, educational materials, and care plan.   Tomasa Rand RN, BSN, CEN RN Case Freight forwarder - [facility] [employer] [facility] Mobile: [phone]

## 2021-05-26 NOTE — Progress Notes (Signed)
Acute Office Visit  Subjective:    Patient ID: Glen Lee, male    DOB: 02/29/1934, 87 y.o.   MRN: [id number]  Chief Complaint  Patient presents with   Bilateral edema    HPI Patient is in today for still having chronic lymphedema and weeping leg edema.  Unna boots removed. He drinks 6 - 8 oz fluids a day.  Past Medical History:  Diagnosis Date   Anemia    Arthritis    Benign prostate hyperplasia    Diabetes mellitus without complication (HCC)    Edema    right leg   Enlarged prostate    Esophageal reflux    Essential hypertension    HOH (hard of hearing)    wears hearing aid   Hyperlipidemia    Hypertension    Mixed hyperlipidemia    Tremor    right hand   Type 2 diabetes mellitus with other specified complication (HCC)    Vitamin D deficiency     Past Surgical History:  Procedure Laterality Date   JOINT REPLACEMENT Left    knee   KNEE SURGERY     ORIF ANKLE FRACTURE Right 06/30/2016   Procedure: OPEN REDUCTION INTERNAL FIXATION (ORIF) ANKLE FRACTURE;  Surgeon: Altamese Twin Lake, MD;  Location: [place];  Service: Orthopedics;  Laterality: Right;    Family History  Problem Relation Age of Onset   Hyperlipidemia Mother    Diabetes Mother    Colon cancer Father    Diabetes Father     Social History   Socioeconomic History   Marital status: Single    Spouse name: Not on file   Number of children: 3   Years of education: Not on file   Highest education level: Not on file  Occupational History   Not on file  Tobacco Use   Smoking status: Former    Years: 20.00    Types: Cigarettes    Quit date: 08/04/1992    Years since quitting: 28.8   Smokeless tobacco: Never  Vaping Use   Vaping Use: Never used  Substance and Sexual Activity   Alcohol use: Not Currently    Alcohol/week: 0.0 standard drinks   Drug use: Never   Sexual activity: Not Currently  Other Topics Concern   Not on file  Social History Narrative   Concern for pt ability to care for  himself. Pt refused placement post hospitalization after rehab   Social Determinants of Health   Financial Resource Strain: High Risk   Difficulty of Paying Living Expenses: Hard  Food Insecurity: Not on file  Transportation Needs: Not on file  Physical Activity: Not on file  Stress: Not on file  Social Connections: Not on file  Intimate Partner Violence: Not on file    Outpatient Medications Prior to Visit  Medication Sig Dispense Refill   dapagliflozin propanediol (FARXIGA) 5 MG TABS tablet Take 1 tablet (5 mg total) by mouth daily. 9 tablet 3   diclofenac Sodium (VOLTAREN) 1 % GEL SMARTSIG:2 Gram(s) Topical 1 to 4 Times Daily     diltiazem (CARDIZEM CD) 120 MG 24 hr capsule TAKE 1 CAPSULE BY MOUTH EVERY DAY 90 capsule 2   erythromycin ophthalmic ointment SMARTSIG:1 Sparingly In Eye(s) Twice Daily     ferrous sulfate 325 (65 FE) MG tablet Take 325 mg by mouth daily with breakfast.     finasteride (PROSCAR) 5 MG tablet TAKE 1 TABLET BY MOUTH EVERY DAY 90 tablet 2   furosemide (LASIX) 20  MG tablet Take 1 tablet (20 mg total) by mouth daily. 1 tablet 0   ketoconazole (NIZORAL) 2 % shampoo USE AS DIRECTED, ROTATE BETWEEN OVER THE COUNTER SHAMPOOS     levothyroxine (SYNTHROID) 25 MCG tablet Take 1 tablet (25 mcg total) by mouth daily. 90 tablet 3   Mesalamine 800 MG TBEC TAKE 1 TABLET BY MOUTH TWICE A DAY 180 tablet 2   pantoprazole (PROTONIX) 20 MG tablet TAKE 1 TABLET BY MOUTH EVERY DAY AS DIRECTED 90 tablet 3   simvastatin (ZOCOR) 20 MG tablet TAKE 1 TABLET BY MOUTH EVERY DAY 90 tablet 2   tamsulosin (FLOMAX) 0.4 MG CAPS capsule TAKE 1 CAPSULE BY MOUTH EVERY DAY 90 capsule 2   TRADJENTA 5 MG TABS tablet TAKE 1 TABLET BY MOUTH EVERY DAY 90 tablet 3   traZODone (DESYREL) 100 MG tablet TAKE 1 TABLET BY MOUTH AT NIGHT 90 tablet 2   No facility-administered medications prior to visit.    Allergies  Allergen Reactions   Clindamycin/Lincomycin Diarrhea    bloody   Doxycycline     Sulfa Antibiotics Other (See Comments)   Torsemide    Keflet [Cephalexin] Rash    Loses control of legs.     Review of Systems  Constitutional:  Negative for appetite change, fatigue and fever.  HENT:  Negative for congestion, ear pain and sore throat.   Respiratory:  Negative for cough and shortness of breath.   Cardiovascular:  Positive for leg swelling (Bilateral with weeping). Negative for chest pain.  Gastrointestinal:  Negative for abdominal pain, constipation, diarrhea, nausea and vomiting.  Genitourinary:  Negative for dysuria and frequency.  Musculoskeletal:  Negative for arthralgias and myalgias.  Neurological:  Negative for dizziness and headaches.  Psychiatric/Behavioral:  Negative for dysphoric mood. The patient is not nervous/anxious.       Objective:    Physical Exam Vitals reviewed.  Constitutional:      Appearance: Normal appearance. He is normal weight.  HENT:     Head: Normocephalic and atraumatic.     Right Ear: Tympanic membrane normal.     Left Ear: Tympanic membrane normal.  Cardiovascular:     Rate and Rhythm: Normal rate and regular rhythm.     Pulses: Normal pulses.     Heart sounds: Normal heart sounds. No murmur heard.   No gallop.  Pulmonary:     Effort: No respiratory distress.     Breath sounds: Normal breath sounds. No wheezing.  Abdominal:     General: Abdomen is flat. Bowel sounds are normal.     Palpations: Abdomen is soft.  Skin:    Comments: Bilateral swelling 4 + and skin breakdown with weeping skin  Neurological:     Mental Status: He is alert and oriented to person, place, and time.  Psychiatric:        Mood and Affect: Mood normal.        Behavior: Behavior normal.    BP 122/68 (BP Location: Right Arm, Patient Position: Sitting)    Pulse 88    Temp 97.6 F (36.4 C) (Temporal)    Ht _0  (1.778 m)    Wt 193 lb (87.5 kg)    SpO2 100%    BMI 27.69 kg/m  Wt Readings from Last 3 Encounters:  05/26/21 193 lb (87.5 kg)  05/20/21  193 lb (87.5 kg)  05/16/21 193 lb (87.5 kg)    Health Maintenance Due  Topic Date Due   OPHTHALMOLOGY EXAM  Never done  Zoster Vaccines- Shingrix (1 of 2) Never done   Pneumonia Vaccine 28+ Years old (2 - PCV) 05/17/2021    There are no preventive care reminders to display for this patient.   Lab Results  Component Value Date   TSH 6.230 (H) 05/20/2021   Lab Results  Component Value Date   WBC 7.5 05/20/2021   HGB 9.7 (L) 05/20/2021   HCT 30.5 (L) 05/20/2021   MCV 85 05/20/2021   PLT 240 05/20/2021   Lab Results  Component Value Date   NA 140 05/20/2021   K 4.2 05/20/2021   CO2 24 05/20/2021   GLUCOSE 121 (H) 05/20/2021   BUN 24 05/20/2021   CREATININE 1.62 (H) 05/20/2021   BILITOT 0.6 05/20/2021   ALKPHOS 125 (H) 05/20/2021   AST 12 05/20/2021   ALT 8 05/20/2021   PROT 6.9 05/20/2021   ALBUMIN 4.0 05/20/2021   CALCIUM 9.5 05/20/2021   ANIONGAP 8 07/03/2016   EGFR 41 (L) 05/20/2021   Lab Results  Component Value Date   CHOL 120 03/21/2021   Lab Results  Component Value Date   HDL 55 03/21/2021   Lab Results  Component Value Date   LDLCALC 47 03/21/2021   Lab Results  Component Value Date   TRIG 92 03/21/2021   Lab Results  Component Value Date   CHOLHDL 2.2 03/21/2021   Lab Results  Component Value Date   HGBA1C 6.8 (H) 05/20/2021         Assessment & Plan:   Diagnoses and all orders for this visit: Hereditary lymphedema of legs -     AMB referral to wound care center Patient has chronic lymphedema and is to see vascular  Cellulitis of lower extremity, unspecified laterality -     ciprofloxacin (CIPRO) 500 MG tablet; Take 1 tablet (500 mg total) by mouth 2 (two) times daily for 10 days. The culture of leg is growing bacteria, acinetobactor  Cellulitis of left lower leg -     AMB referral to wound care center  Use unna boots bilaterally after cleaning legs  Meds ordered this encounter  Medications   ciprofloxacin (CIPRO) 500 MG  tablet    Sig: Take 1 tablet (500 mg total) by mouth 2 (two) times daily for 10 days.    Dispense:  20 tablet    Refill:  0     I,Lauren M Auman,acting as a scribe for Reinaldo Meeker, MD.,have documented all relevant documentation on the behalf of Reinaldo Meeker, MD,as directed by  Reinaldo Meeker, MD while in the presence of Reinaldo Meeker, MD.    Reinaldo Meeker, MD

## 2021-05-27 ENCOUNTER — Other Ambulatory Visit: Payer: Self-pay | Admitting: Legal Medicine

## 2021-05-27 DIAGNOSIS — K5100 Ulcerative (chronic) pancolitis without complications: Secondary | ICD-10-CM

## 2021-05-28 LAB — WOUND CULTURE: Organism ID, Bacteria: NONE SEEN

## 2021-06-01 ENCOUNTER — Encounter: Payer: Self-pay | Admitting: Legal Medicine

## 2021-06-01 ENCOUNTER — Other Ambulatory Visit: Payer: Self-pay

## 2021-06-01 ENCOUNTER — Ambulatory Visit: Payer: Medicare Other | Admitting: Legal Medicine

## 2021-06-01 DIAGNOSIS — I890 Lymphedema, not elsewhere classified: Secondary | ICD-10-CM | POA: Insufficient documentation

## 2021-06-01 NOTE — Progress Notes (Signed)
Acute Office Visit  Subjective:    Patient ID: Glen Lee, male    DOB: Dec 18, 1933, 87 y.o.   MRN: [id number]  Chief Complaint  Patient presents with   Lymphedema on both legs    HPI Patient is in today for still having chronic lymphedema and weeping leg edema.  Unna boots removed. He drinks 6 - 8 oz fluids a day. We are unable to get any home health service or local wound center to help with home lymphedema care.  He is to see vascular on 06/07/21  Past Medical History:  Diagnosis Date   Anemia    Arthritis    Benign prostate hyperplasia    Diabetes mellitus without complication (HCC)    Edema    right leg   Enlarged prostate    Esophageal reflux    Essential hypertension    HOH (hard of hearing)    wears hearing aid   Hyperlipidemia    Hypertension    Mixed hyperlipidemia    Tremor    right hand   Type 2 diabetes mellitus with other specified complication (HCC)    Vitamin D deficiency     Past Surgical History:  Procedure Laterality Date   JOINT REPLACEMENT Left    knee   KNEE SURGERY     ORIF ANKLE FRACTURE Right 06/30/2016   Procedure: OPEN REDUCTION INTERNAL FIXATION (ORIF) ANKLE FRACTURE;  Surgeon: Altamese Lindsey, MD;  Location: [place];  Service: Orthopedics;  Laterality: Right;    Family History  Problem Relation Age of Onset   Hyperlipidemia Mother    Diabetes Mother    Colon cancer Father    Diabetes Father     Social History   Socioeconomic History   Marital status: Single    Spouse name: Not on file   Number of children: 3   Years of education: Not on file   Highest education level: Not on file  Occupational History   Not on file  Tobacco Use   Smoking status: Former    Years: 20.00    Types: Cigarettes    Quit date: 08/04/1992    Years since quitting: 28.8   Smokeless tobacco: Never  Vaping Use   Vaping Use: Never used  Substance and Sexual Activity   Alcohol use: Not Currently    Alcohol/week: 0.0 standard drinks   Drug use:  Never   Sexual activity: Not Currently  Other Topics Concern   Not on file  Social History Narrative   Concern for pt ability to care for himself. Pt refused placement post hospitalization after rehab   Social Determinants of Health   Financial Resource Strain: High Risk   Difficulty of Paying Living Expenses: Hard  Food Insecurity: Not on file  Transportation Needs: Not on file  Physical Activity: Not on file  Stress: Not on file  Social Connections: Not on file  Intimate Partner Violence: Not on file    Outpatient Medications Prior to Visit  Medication Sig Dispense Refill   ciprofloxacin (CIPRO) 500 MG tablet Take 1 tablet (500 mg total) by mouth 2 (two) times daily for 10 days. 20 tablet 0   dapagliflozin propanediol (FARXIGA) 5 MG TABS tablet Take 1 tablet (5 mg total) by mouth daily. 9 tablet 3   diclofenac Sodium (VOLTAREN) 1 % GEL SMARTSIG:2 Gram(s) Topical 1 to 4 Times Daily     diltiazem (CARDIZEM CD) 120 MG 24 hr capsule TAKE 1 CAPSULE BY MOUTH EVERY DAY 90 capsule 2  erythromycin ophthalmic ointment SMARTSIG:1 Sparingly In Eye(s) Twice Daily     ferrous sulfate 325 (65 FE) MG tablet Take 325 mg by mouth daily with breakfast.     finasteride (PROSCAR) 5 MG tablet TAKE 1 TABLET BY MOUTH EVERY DAY 90 tablet 2   furosemide (LASIX) 20 MG tablet Take 1 tablet (20 mg total) by mouth daily. 1 tablet 0   ketoconazole (NIZORAL) 2 % shampoo USE AS DIRECTED, ROTATE BETWEEN OVER THE COUNTER SHAMPOOS     levothyroxine (SYNTHROID) 25 MCG tablet Take 1 tablet (25 mcg total) by mouth daily. 90 tablet 3   Mesalamine 800 MG TBEC TAKE 1 TABLET BY MOUTH TWICE A DAY 180 tablet 2   pantoprazole (PROTONIX) 20 MG tablet TAKE 1 TABLET BY MOUTH EVERY DAY AS DIRECTED 90 tablet 3   simvastatin (ZOCOR) 20 MG tablet TAKE 1 TABLET BY MOUTH EVERY DAY 90 tablet 2   tamsulosin (FLOMAX) 0.4 MG CAPS capsule TAKE 1 CAPSULE BY MOUTH EVERY DAY 90 capsule 2   TRADJENTA 5 MG TABS tablet TAKE 1 TABLET BY MOUTH  EVERY DAY 90 tablet 3   traZODone (DESYREL) 100 MG tablet TAKE 1 TABLET BY MOUTH AT NIGHT 90 tablet 2   No facility-administered medications prior to visit.    Allergies  Allergen Reactions   Clindamycin/Lincomycin Diarrhea    bloody   Doxycycline    Sulfa Antibiotics Other (See Comments)   Torsemide    Keflet [Cephalexin] Rash    Loses control of legs.     Review of Systems  Constitutional:  Negative for appetite change, fatigue and fever.  HENT:  Negative for congestion, ear pain and sore throat.   Respiratory:  Negative for cough and shortness of breath.   Cardiovascular:  Positive for leg swelling (Bilateral with weeping). Negative for chest pain.  Gastrointestinal:  Negative for abdominal pain, constipation, diarrhea, nausea and vomiting.  Genitourinary:  Negative for dysuria and frequency.  Musculoskeletal:  Negative for arthralgias and myalgias.  Neurological:  Negative for dizziness and headaches.  Psychiatric/Behavioral:  Negative for dysphoric mood. The patient is not nervous/anxious.       Objective:    Physical Exam Vitals reviewed.  Constitutional:      Appearance: Normal appearance. He is normal weight.  HENT:     Head: Normocephalic and atraumatic.     Right Ear: Tympanic membrane normal.     Left Ear: Tympanic membrane normal.  Cardiovascular:     Rate and Rhythm: Normal rate and regular rhythm.     Pulses: Normal pulses.     Heart sounds: Normal heart sounds. No murmur heard.   No gallop.  Pulmonary:     Effort: No respiratory distress.     Breath sounds: Normal breath sounds. No wheezing.  Abdominal:     General: Abdomen is flat. Bowel sounds are normal.     Palpations: Abdomen is soft.  Skin:    Comments: Bilateral swelling 4 + and skin breakdown with weeping skin  Neurological:     Mental Status: He is alert and oriented to person, place, and time.  Psychiatric:        Mood and Affect: Mood normal.        Behavior: Behavior normal.    BP  120/80    Pulse 64    Temp 98 F (36.7 C)    Resp 16    Ht _0  (1.778 m)    Wt 193 lb (87.5 kg)    SpO2 98%  BMI 27.69 kg/m  Wt Readings from Last 3 Encounters:  06/01/21 193 lb (87.5 kg)  05/26/21 193 lb (87.5 kg)  05/20/21 193 lb (87.5 kg)    Health Maintenance Due  Topic Date Due   OPHTHALMOLOGY EXAM  Never done   Zoster Vaccines- Shingrix (1 of 2) Never done   Pneumonia Vaccine 68+ Years old (2 - PCV) 05/17/2021    There are no preventive care reminders to display for this patient.   Lab Results  Component Value Date   TSH 6.230 (H) 05/20/2021   Lab Results  Component Value Date   WBC 7.5 05/20/2021   HGB 9.7 (L) 05/20/2021   HCT 30.5 (L) 05/20/2021   MCV 85 05/20/2021   PLT 240 05/20/2021   Lab Results  Component Value Date   NA 140 05/20/2021   K 4.2 05/20/2021   CO2 24 05/20/2021   GLUCOSE 121 (H) 05/20/2021   BUN 24 05/20/2021   CREATININE 1.62 (H) 05/20/2021   BILITOT 0.6 05/20/2021   ALKPHOS 125 (H) 05/20/2021   AST 12 05/20/2021   ALT 8 05/20/2021   PROT 6.9 05/20/2021   ALBUMIN 4.0 05/20/2021   CALCIUM 9.5 05/20/2021   ANIONGAP 8 07/03/2016   EGFR 41 (L) 05/20/2021   Lab Results  Component Value Date   CHOL 120 03/21/2021   Lab Results  Component Value Date   HDL 55 03/21/2021   Lab Results  Component Value Date   LDLCALC 47 03/21/2021   Lab Results  Component Value Date   TRIG 92 03/21/2021   Lab Results  Component Value Date   CHOLHDL 2.2 03/21/2021   Lab Results  Component Value Date   HGBA1C 6.8 (H) 05/20/2021         Assessment & Plan:   Diagnoses and all orders for this visit: Hereditary lymphedema of legs -     AMB referral to wound care center Patient has chronic lymphedema and is to see vascular  Cellulitis of lower extremity, unspecified laterality -     ciprofloxacin (CIPRO) 500 MG tablet; Take 1 tablet (500 mg total) by mouth 2 (two) times daily for 10 days. The culture of leg is growing bacteria,  acinetobactor- still on cipro  Cellulitis of left lower leg -     AMB referral to wound care center  Use unna boots bilaterally after cleaning legs   No follow-ups on file. To decide after vascular consult  I Reinaldo Meeker, MD

## 2021-06-06 NOTE — Progress Notes (Signed)
[facility] [place]  ASSESSMENT / PLAN: Glen Lee is a 87 y.o. male with chronic venous insufficiency of bilateral lower extremities causing eczematous rash and ulceration (C6 disease).  Venous duplex shows no superficial venous reflux. No interventions available to expedite healing. Needs unna boot therapy until skin is able to heal. Will refer to wound care center.  Once legs have healed recommend compression and elevation for symptomatic relief. Follow up with me as needed.  CHIEF COMPLAINT: bilateral lower extremity leg rash  HISTORY OF PRESENT ILLNESS: Glen Lee is a 87 y.o. male with chronic venous ulceration of his bilateral lower extremities.  He has tried [employer] without much relief.  He continues to have severe weeping and eczematous rash across the calves bilaterally.  He is not wearing an Haematologist today.  I counseled him extensively about the natural history of chronic venous insufficiency.  I reviewed his venous duplex which shows no evidence of superficial venous reflux.  I counseled him that local wound care is our only option going forward.  He and his family are understandably frustrated.  Past Medical History:  Diagnosis Date   Anemia    Arthritis    Benign prostate hyperplasia    Diabetes mellitus without complication (HCC)    Edema    right leg   Enlarged prostate    Esophageal reflux    Essential hypertension    HOH (hard of hearing)    wears hearing aid   Hyperlipidemia    Hypertension    Mixed hyperlipidemia    Tremor    right hand   Type 2 diabetes mellitus with other specified complication (HCC)    Vitamin D deficiency     Past Surgical History:  Procedure Laterality Date   JOINT REPLACEMENT Left    knee   KNEE SURGERY     ORIF ANKLE FRACTURE Right 06/30/2016   Procedure: OPEN REDUCTION INTERNAL FIXATION (ORIF) ANKLE FRACTURE;  Surgeon: Altamese [place], MD;  Location: [place];  Service: Orthopedics;  Laterality:  Right;    Family History  Problem Relation Age of Onset   Hyperlipidemia Mother    Diabetes Mother    Colon cancer Father    Diabetes Father     Social History   Socioeconomic History   Marital status: Single    Spouse name: Not on file   Number of children: 3   Years of education: Not on file   Highest education level: Not on file  Occupational History   Not on file  Tobacco Use   Smoking status: Former    Years: 20.00    Types: Cigarettes    Quit date: 08/04/1992    Years since quitting: 28.8   Smokeless tobacco: Never  Vaping Use   Vaping Use: Never used  Substance and Sexual Activity   Alcohol use: Not Currently    Alcohol/week: 0.0 standard drinks   Drug use: Never   Sexual activity: Not Currently  Other Topics Concern   Not on file  Social History Narrative   Concern for pt ability to care for himself. Pt refused placement post hospitalization after rehab   Social Determinants of Health   Financial Resource Strain: High Risk   Difficulty of Paying Living Expenses: Hard  Food Insecurity: Not on file  Transportation Needs: Not on file  Physical Activity: Not on file  Stress: Not on file  Social Connections: Not on file  Intimate Partner Violence: Not on file  Allergies  Allergen Reactions   Clindamycin/Lincomycin Diarrhea    bloody   Doxycycline    Sulfa Antibiotics Other (See Comments)   Torsemide    Keflet [Cephalexin] Rash    Loses control of legs.     Current Outpatient Medications  Medication Sig Dispense Refill   dapagliflozin propanediol (FARXIGA) 5 MG TABS tablet Take 1 tablet (5 mg total) by mouth daily. 9 tablet 3   diclofenac Sodium (VOLTAREN) 1 % GEL SMARTSIG:2 Gram(s) Topical 1 to 4 Times Daily     diltiazem (CARDIZEM CD) 120 MG 24 hr capsule TAKE 1 CAPSULE BY MOUTH EVERY DAY 90 capsule 2   erythromycin ophthalmic ointment SMARTSIG:1 Sparingly In Eye(s) Twice Daily     ferrous sulfate 325 (65 FE) MG tablet Take 325 mg by mouth  daily with breakfast.     finasteride (PROSCAR) 5 MG tablet TAKE 1 TABLET BY MOUTH EVERY DAY 90 tablet 2   furosemide (LASIX) 20 MG tablet Take 1 tablet (20 mg total) by mouth daily. 1 tablet 0   ketoconazole (NIZORAL) 2 % shampoo USE AS DIRECTED, ROTATE BETWEEN OVER THE COUNTER SHAMPOOS     levothyroxine (SYNTHROID) 25 MCG tablet Take 1 tablet (25 mcg total) by mouth daily. 90 tablet 3   Mesalamine 800 MG TBEC TAKE 1 TABLET BY MOUTH TWICE A DAY 180 tablet 2   pantoprazole (PROTONIX) 20 MG tablet TAKE 1 TABLET BY MOUTH EVERY DAY AS DIRECTED 90 tablet 3   simvastatin (ZOCOR) 20 MG tablet TAKE 1 TABLET BY MOUTH EVERY DAY 90 tablet 2   tamsulosin (FLOMAX) 0.4 MG CAPS capsule TAKE 1 CAPSULE BY MOUTH EVERY DAY 90 capsule 2   TRADJENTA 5 MG TABS tablet TAKE 1 TABLET BY MOUTH EVERY DAY 90 tablet 3   traZODone (DESYREL) 100 MG tablet TAKE 1 TABLET BY MOUTH AT NIGHT 90 tablet 2   No current facility-administered medications for this visit.    REVIEW OF SYSTEMS:  [X]  denotes positive finding, [ ]  denotes negative finding Cardiac  Comments:  Chest pain or chest pressure:    Shortness of breath upon exertion:    Short of breath when lying flat:    Irregular heart rhythm:        Vascular    Pain in calf, thigh, or hip brought on by ambulation:    Pain in feet at night that wakes you up from your sleep:     Blood clot in your veins:    Leg swelling:         Pulmonary    Oxygen at home:    Productive cough:     Wheezing:         Neurologic    Sudden weakness in arms or legs:     Sudden numbness in arms or legs:     Sudden onset of difficulty speaking or slurred speech:    Temporary loss of vision in one eye:     Problems with dizziness:         Gastrointestinal    Blood in stool:     Vomited blood:         Genitourinary    Burning when urinating:     Blood in urine:        Psychiatric    Major depression:         Hematologic    Bleeding problems:    Problems with blood clotting  too easily:        Skin  Rashes or ulcers:        Constitutional    Fever or chills:      PHYSICAL EXAM Vitals:   06/07/21 1522  BP: (!) 137/56  Pulse: 71  Resp: 20  Temp: 98 F (36.7 C)  SpO2: 100%  Weight: 193 lb (87.5 kg)  Height: 5' 10"  (1.778 m)    Constitutional: Chronically ill and elderly appearing. no distress. Appears marginally nourished.  Neurologic: CN intact. no focal findings. no sensory loss. Limited exam - in a wheel chair. Psychiatric:  Mood and affect symmetric and appropriate. Eyes:  No icterus. No conjunctival pallor. Ears, nose, throat:  mucous membranes moist. Midline trachea.  Cardiac: regular rate and rhythm.  Respiratory:  unlabored. Abdominal:  soft, non-tender, non-distended.  Peripheral vascular: Severe eczematous skin changes throughout the calves bilaterally.  Weeping clear fluid.  No active ulceration, but evidence of prior healed ulcers. Extremity: no edema. no cyanosis. no pallor.  Skin: no gangrene. no ulceration.  Lymphatic: + Stemmer's sign. no palpable lymphadenopathy.  PERTINENT LABORATORY AND RADIOLOGIC DATA  Most recent CBC CBC Latest Ref Rng & Units 05/20/2021 03/28/2021 03/21/2021  WBC 3.4 - 10.8 x10E3/uL 7.5 5.8 6.3  Hemoglobin 13.0 - 17.7 g/dL 9.7(L) 9.5(L) 9.6(L)  Hematocrit 37.5 - 51.0 % 30.5(L) 29.9(L) 28.1(L)  Platelets 150 - 450 x10E3/uL 240 183 179     Most recent CMP CMP Latest Ref Rng & Units 05/20/2021 03/28/2021 03/21/2021  Glucose 70 - 99 mg/dL 121(H) 153(H) 124(H)  BUN 8 - 27 mg/dL 24 31(H) 31(H)  Creatinine 0.76 - 1.27 mg/dL 1.62(H) 1.67(H) 1.72(H)  Sodium 134 - 144 mmol/L 140 142 143  Potassium 3.5 - 5.2 mmol/L 4.2 4.2 4.1  Chloride 96 - 106 mmol/L 100 106 103  CO2 20 - 29 mmol/L 24 22 24   Calcium 8.6 - 10.2 mg/dL 9.5 9.4 9.7  Total Protein 6.0 - 8.5 g/dL 6.9 6.3 6.5  Total Bilirubin 0.0 - 1.2 mg/dL 0.6 0.5 0.7  Alkaline Phos 44 - 121 IU/L 125(H) 118 126(H)  AST 0 - 40 IU/L 12 13 18   ALT 0 - 44 IU/L  8 10 7     Renal function Estimated Creatinine Clearance: 34.4 mL/min (A) (by C-G formula based on SCr of 1.62 mg/dL (H)).  Hgb A1c MFr Bld (%)  Date Value  05/20/2021 6.8 (H)    LDL Chol Calc (NIH)  Date Value Ref Range Status  03/21/2021 47 0 - 99 mg/dL Final     Vascular Imaging: Bilateral lower extremity venous reflux study shows no evidence of deep venous thrombosis.  No evidence of significant superficial venous reflux is seen in the left greater saphenous vein.  A small segment of reflux is noted in the right greater saphenous vein but only in one anatomic location and not at the saphenofemoral junction.  Yevonne Aline. Stanford Breed, MD [facility] Phone Number: [phone] 06/07/2021 5:40 PM  Total time spent on preparing this encounter including chart review, data review, collecting history, examining the patient, coordinating care for this new patient, 60 minutes.  Portions of this report may have been transcribed using voice recognition software.  Every effort has been made to ensure accuracy; however, inadvertent computerized transcription errors may still be present.

## 2021-06-07 ENCOUNTER — Other Ambulatory Visit: Payer: Self-pay

## 2021-06-07 ENCOUNTER — Ambulatory Visit (HOSPITAL_COMMUNITY)
Admission: RE | Admit: 2021-06-07 | Discharge: 2021-06-07 | Disposition: A | Discharge status: Home / Self Care | Payer: Medicare Other | Source: Ambulatory Visit | Attending: Vascular Surgery | Admitting: Vascular Surgery

## 2021-06-07 ENCOUNTER — Ambulatory Visit: Payer: Medicare Other | Admitting: Vascular Surgery

## 2021-06-07 ENCOUNTER — Encounter: Payer: Self-pay | Admitting: Vascular Surgery

## 2021-06-07 VITALS — BP 137/56 | HR 71 | Temp 98.0°F | Resp 20 | Ht 70.0 in | Wt 193.0 lb

## 2021-06-07 DIAGNOSIS — I8390 Asymptomatic varicose veins of unspecified lower extremity: Secondary | ICD-10-CM | POA: Diagnosis present

## 2021-06-07 DIAGNOSIS — I872 Venous insufficiency (chronic) (peripheral): Secondary | ICD-10-CM

## 2021-06-09 ENCOUNTER — Encounter: Payer: Self-pay | Admitting: Legal Medicine

## 2021-06-09 ENCOUNTER — Ambulatory Visit: Payer: Medicare Other | Admitting: Legal Medicine

## 2021-06-09 VITALS — BP 136/68 | HR 87 | Temp 96.5°F | Ht 70.0 in | Wt 210.0 lb

## 2021-06-09 DIAGNOSIS — I872 Venous insufficiency (chronic) (peripheral): Secondary | ICD-10-CM | POA: Diagnosis not present

## 2021-06-09 NOTE — Progress Notes (Signed)
Acute Office Visit  Subjective:    Patient ID: Glen Lee, male    DOB: 1933/09/15, 87 y.o.   MRN: [id number]  Chief Complaint  Patient presents with   Lymphedema    HPI Patient is in today for still having chronic stasis edema and weeping leg edema.  Unna boots removed. He drinks 6 - 8 oz fluids a day. We are unable to get any home health service or local wound center to help with home venous stasis care.  He is to see vascular on 06/07/21 agreed with venous stasis changes with chronic unna boots  Past Medical History:  Diagnosis Date   Anemia    Arthritis    Benign prostate hyperplasia    Diabetes mellitus without complication (HCC)    Edema    right leg   Enlarged prostate    Esophageal reflux    Essential hypertension    HOH (hard of hearing)    wears hearing aid   Hyperlipidemia    Hypertension    Mixed hyperlipidemia    Tremor    right hand   Type 2 diabetes mellitus with other specified complication (HCC)    Vitamin D deficiency     Past Surgical History:  Procedure Laterality Date   JOINT REPLACEMENT Left    knee   KNEE SURGERY     ORIF ANKLE FRACTURE Right 06/30/2016   Procedure: OPEN REDUCTION INTERNAL FIXATION (ORIF) ANKLE FRACTURE;  Surgeon: Altamese Hammond, MD;  Location: [place];  Service: Orthopedics;  Laterality: Right;    Family History  Problem Relation Age of Onset   Hyperlipidemia Mother    Diabetes Mother    Colon cancer Father    Diabetes Father     Social History   Socioeconomic History   Marital status: Single    Spouse name: Not on file   Number of children: 3   Years of education: Not on file   Highest education level: Not on file  Occupational History   Not on file  Tobacco Use   Smoking status: Former    Years: 20.00    Types: Cigarettes    Quit date: 08/04/1992    Years since quitting: 28.8   Smokeless tobacco: Never  Vaping Use   Vaping Use: Never used  Substance and Sexual Activity   Alcohol use: Not Currently     Alcohol/week: 0.0 standard drinks   Drug use: Never   Sexual activity: Not Currently  Other Topics Concern   Not on file  Social History Narrative   Concern for pt ability to care for himself. Pt refused placement post hospitalization after rehab   Social Determinants of Health   Financial Resource Strain: High Risk   Difficulty of Paying Living Expenses: Hard  Food Insecurity: Not on file  Transportation Needs: Not on file  Physical Activity: Not on file  Stress: Not on file  Social Connections: Not on file  Intimate Partner Violence: Not on file    Outpatient Medications Prior to Visit  Medication Sig Dispense Refill   dapagliflozin propanediol (FARXIGA) 5 MG TABS tablet Take 1 tablet (5 mg total) by mouth daily. 9 tablet 3   diclofenac Sodium (VOLTAREN) 1 % GEL SMARTSIG:2 Gram(s) Topical 1 to 4 Times Daily     diltiazem (CARDIZEM CD) 120 MG 24 hr capsule TAKE 1 CAPSULE BY MOUTH EVERY DAY 90 capsule 2   erythromycin ophthalmic ointment SMARTSIG:1 Sparingly In Eye(s) Twice Daily     ferrous sulfate  325 (65 FE) MG tablet Take 325 mg by mouth daily with breakfast.     finasteride (PROSCAR) 5 MG tablet TAKE 1 TABLET BY MOUTH EVERY DAY 90 tablet 2   furosemide (LASIX) 20 MG tablet Take 1 tablet (20 mg total) by mouth daily. 1 tablet 0   ketoconazole (NIZORAL) 2 % shampoo USE AS DIRECTED, ROTATE BETWEEN OVER THE COUNTER SHAMPOOS     levothyroxine (SYNTHROID) 25 MCG tablet Take 1 tablet (25 mcg total) by mouth daily. 90 tablet 3   Mesalamine 800 MG TBEC TAKE 1 TABLET BY MOUTH TWICE A DAY 180 tablet 2   pantoprazole (PROTONIX) 20 MG tablet TAKE 1 TABLET BY MOUTH EVERY DAY AS DIRECTED 90 tablet 3   simvastatin (ZOCOR) 20 MG tablet TAKE 1 TABLET BY MOUTH EVERY DAY 90 tablet 2   tamsulosin (FLOMAX) 0.4 MG CAPS capsule TAKE 1 CAPSULE BY MOUTH EVERY DAY 90 capsule 2   TRADJENTA 5 MG TABS tablet TAKE 1 TABLET BY MOUTH EVERY DAY 90 tablet 3   traZODone (DESYREL) 100 MG tablet TAKE 1 TABLET  BY MOUTH AT NIGHT 90 tablet 2   No facility-administered medications prior to visit.    Allergies  Allergen Reactions   Clindamycin/Lincomycin Diarrhea    bloody   Doxycycline    Sulfa Antibiotics Other (See Comments)   Torsemide    Keflet [Cephalexin] Rash    Loses control of legs.     Review of Systems  Constitutional:  Negative for appetite change, fatigue and fever.  HENT:  Negative for congestion, ear pain and sore throat.   Respiratory:  Negative for cough and shortness of breath.   Cardiovascular:  Positive for leg swelling (Bilateral with weeping). Negative for chest pain.  Gastrointestinal:  Negative for abdominal pain, constipation, diarrhea, nausea and vomiting.  Genitourinary:  Negative for dysuria and frequency.  Musculoskeletal:  Negative for arthralgias and myalgias.  Neurological:  Negative for dizziness and headaches.  Psychiatric/Behavioral:  Negative for dysphoric mood. The patient is not nervous/anxious.       Objective:    Physical Exam Vitals reviewed.  Constitutional:      Appearance: Normal appearance. He is normal weight.  HENT:     Head: Normocephalic and atraumatic.     Right Ear: Tympanic membrane normal.     Left Ear: Tympanic membrane normal.  Cardiovascular:     Rate and Rhythm: Normal rate and regular rhythm.     Pulses: Normal pulses.     Heart sounds: Normal heart sounds. No murmur heard.   No gallop.  Pulmonary:     Effort: No respiratory distress.     Breath sounds: Normal breath sounds. No wheezing.  Abdominal:     General: Abdomen is flat. Bowel sounds are normal.     Palpations: Abdomen is soft.  Skin:    Comments: Bilateral swelling 4 + and skin breakdown with weeping skin  Neurological:     Mental Status: He is alert and oriented to person, place, and time.  Psychiatric:        Mood and Affect: Mood normal.        Behavior: Behavior normal.    BP 136/68    Pulse 87    Temp (!) 96.5 F (35.8 C)    Ht 5' 10"  (1.778 m)     Wt 210 lb (95.3 kg)    SpO2 100%    BMI 30.13 kg/m  Wt Readings from Last 3 Encounters:  06/09/21 210 lb (95.3 kg)  06/07/21 193 lb (87.5 kg)  06/01/21 193 lb (87.5 kg)    Health Maintenance Due  Topic Date Due   OPHTHALMOLOGY EXAM  Never done   Zoster Vaccines- Shingrix (1 of 2) Never done   Pneumonia Vaccine 9+ Years old (2 - PCV) 05/17/2021    There are no preventive care reminders to display for this patient.   Lab Results  Component Value Date   TSH 6.230 (H) 05/20/2021   Lab Results  Component Value Date   WBC 7.5 05/20/2021   HGB 9.7 (L) 05/20/2021   HCT 30.5 (L) 05/20/2021   MCV 85 05/20/2021   PLT 240 05/20/2021   Lab Results  Component Value Date   NA 140 05/20/2021   K 4.2 05/20/2021   CO2 24 05/20/2021   GLUCOSE 121 (H) 05/20/2021   BUN 24 05/20/2021   CREATININE 1.62 (H) 05/20/2021   BILITOT 0.6 05/20/2021   ALKPHOS 125 (H) 05/20/2021   AST 12 05/20/2021   ALT 8 05/20/2021   PROT 6.9 05/20/2021   ALBUMIN 4.0 05/20/2021   CALCIUM 9.5 05/20/2021   ANIONGAP 8 07/03/2016   EGFR 41 (L) 05/20/2021   Lab Results  Component Value Date   CHOL 120 03/21/2021   Lab Results  Component Value Date   HDL 55 03/21/2021   Lab Results  Component Value Date   LDLCALC 47 03/21/2021   Lab Results  Component Value Date   TRIG 92 03/21/2021   Lab Results  Component Value Date   CHOLHDL 2.2 03/21/2021   Lab Results  Component Value Date   HGBA1C 6.8 (H) 05/20/2021         Assessment & Plan:  Diagnoses and all orders for this visit: Chronic venous insufficiency Clean and redress both legs with unna boot Try to set up home health    Follow up 1 week  I Reinaldo Meeker, MD

## 2021-06-11 DIAGNOSIS — E1151 Type 2 diabetes mellitus with diabetic peripheral angiopathy without gangrene: Secondary | ICD-10-CM

## 2021-06-11 DIAGNOSIS — I10 Essential (primary) hypertension: Secondary | ICD-10-CM | POA: Diagnosis not present

## 2021-06-15 ENCOUNTER — Other Ambulatory Visit: Payer: Self-pay | Admitting: Family Medicine

## 2021-06-16 ENCOUNTER — Ambulatory Visit: Payer: Medicare Other | Admitting: Legal Medicine

## 2021-06-16 NOTE — Telephone Encounter (Signed)
Your patient. Glen Lee

## 2021-06-24 ENCOUNTER — Telehealth: Payer: Self-pay

## 2021-06-24 NOTE — Telephone Encounter (Signed)
I called patient to start [facility], however patient was discharged to [facility] nursing facility.

## 2021-06-28 ENCOUNTER — Ambulatory Visit: Payer: Medicare Other | Admitting: Legal Medicine

## 2021-07-04 ENCOUNTER — Other Ambulatory Visit: Payer: Medicare Other

## 2021-07-07 ENCOUNTER — Other Ambulatory Visit: Payer: Medicare Other

## 2021-07-21 ENCOUNTER — Telehealth: Payer: Medicare Other

## 2021-07-29 ENCOUNTER — Ambulatory Visit: Payer: Self-pay

## 2021-07-29 NOTE — Chronic Care Management (AMB) (Signed)
° °  07/29/2021  SUMMIT ARROYAVE [birth year] [id number]   Per care guide, patient is admitted to SNF.  Case closed to CCM nursing. Care plan resolved.  Tomasa Rand RN, BSN, CEN RN Case Freight forwarder - [employer] officer Mobile: [phone]

## 2021-08-19 ENCOUNTER — Ambulatory Visit: Payer: Medicare Other | Admitting: Legal Medicine

## 2021-08-25 ENCOUNTER — Other Ambulatory Visit: Payer: Self-pay | Admitting: Legal Medicine

## 2021-12-10 DEATH — deceased
